# Patient Record
Sex: Female | Born: 1970 | Race: Black or African American | Hispanic: No | Marital: Married | Smoking: Current every day smoker
Health system: Southern US, Community
[De-identification: ages and names within clinical notes are randomized; demographics above are authoritative.]

## PROBLEM LIST (undated history)

## (undated) DIAGNOSIS — N390 Urinary tract infection, site not specified: Secondary | ICD-10-CM

## (undated) DIAGNOSIS — B019 Varicella without complication: Secondary | ICD-10-CM

## (undated) DIAGNOSIS — N83209 Unspecified ovarian cyst, unspecified side: Secondary | ICD-10-CM

## (undated) DIAGNOSIS — K219 Gastro-esophageal reflux disease without esophagitis: Secondary | ICD-10-CM

## (undated) DIAGNOSIS — F1910 Other psychoactive substance abuse, uncomplicated: Secondary | ICD-10-CM

## (undated) DIAGNOSIS — E786 Lipoprotein deficiency: Secondary | ICD-10-CM

## (undated) DIAGNOSIS — R030 Elevated blood-pressure reading, without diagnosis of hypertension: Secondary | ICD-10-CM

## (undated) DIAGNOSIS — D219 Benign neoplasm of connective and other soft tissue, unspecified: Secondary | ICD-10-CM

## (undated) DIAGNOSIS — I10 Essential (primary) hypertension: Secondary | ICD-10-CM

## (undated) DIAGNOSIS — A64 Unspecified sexually transmitted disease: Secondary | ICD-10-CM

## (undated) DIAGNOSIS — IMO0001 Reserved for inherently not codable concepts without codable children: Secondary | ICD-10-CM

## (undated) DIAGNOSIS — IMO0002 Reserved for concepts with insufficient information to code with codable children: Secondary | ICD-10-CM

## (undated) DIAGNOSIS — F1911 Other psychoactive substance abuse, in remission: Secondary | ICD-10-CM

## (undated) HISTORY — DX: Other psychoactive substance abuse, uncomplicated: F19.10

## (undated) HISTORY — PX: FOOT SURGERY: SHX648

## (undated) HISTORY — DX: Unspecified ovarian cyst, unspecified side: N83.209

## (undated) HISTORY — DX: Lipoprotein deficiency: E78.6

## (undated) HISTORY — DX: Other psychoactive substance abuse, in remission: F19.11

## (undated) HISTORY — DX: Urinary tract infection, site not specified: N39.0

## (undated) HISTORY — DX: Gastro-esophageal reflux disease without esophagitis: K21.9

## (undated) HISTORY — DX: Varicella without complication: B01.9

## (undated) HISTORY — PX: COLONOSCOPY: SHX174

## (undated) HISTORY — PX: WISDOM TOOTH EXTRACTION: SHX21

## (undated) HISTORY — DX: Reserved for concepts with insufficient information to code with codable children: IMO0002

## (undated) HISTORY — DX: Reserved for inherently not codable concepts without codable children: IMO0001

## (undated) HISTORY — DX: Benign neoplasm of connective and other soft tissue, unspecified: D21.9

## (undated) HISTORY — DX: Elevated blood-pressure reading, without diagnosis of hypertension: R03.0

## (undated) HISTORY — DX: Essential (primary) hypertension: I10

## (undated) HISTORY — DX: Unspecified sexually transmitted disease: A64

---

## 1998-04-28 ENCOUNTER — Emergency Department (HOSPITAL_COMMUNITY): Admission: EM | Admit: 1998-04-28 | Discharge: 1998-04-29 | Payer: Self-pay

## 2000-05-06 ENCOUNTER — Emergency Department (HOSPITAL_COMMUNITY): Admission: EM | Admit: 2000-05-06 | Discharge: 2000-05-06 | Payer: Self-pay | Admitting: Emergency Medicine

## 2000-05-06 ENCOUNTER — Encounter: Payer: Self-pay | Admitting: Family Medicine

## 2001-01-21 ENCOUNTER — Emergency Department (HOSPITAL_COMMUNITY): Admission: EM | Admit: 2001-01-21 | Discharge: 2001-01-21 | Payer: Self-pay | Admitting: Emergency Medicine

## 2001-01-22 ENCOUNTER — Emergency Department (HOSPITAL_COMMUNITY): Admission: EM | Admit: 2001-01-22 | Discharge: 2001-01-22 | Payer: Self-pay

## 2001-02-02 ENCOUNTER — Emergency Department (HOSPITAL_COMMUNITY): Admission: EM | Admit: 2001-02-02 | Discharge: 2001-02-02 | Payer: Self-pay | Admitting: Emergency Medicine

## 2001-02-09 ENCOUNTER — Emergency Department (HOSPITAL_COMMUNITY): Admission: EM | Admit: 2001-02-09 | Discharge: 2001-02-09 | Payer: Self-pay | Admitting: Emergency Medicine

## 2002-03-01 ENCOUNTER — Emergency Department (HOSPITAL_COMMUNITY): Admission: EM | Admit: 2002-03-01 | Discharge: 2002-03-01 | Payer: Self-pay | Admitting: Emergency Medicine

## 2006-01-30 ENCOUNTER — Emergency Department (HOSPITAL_COMMUNITY): Admission: EM | Admit: 2006-01-30 | Discharge: 2006-01-31 | Payer: Self-pay | Admitting: *Deleted

## 2006-02-09 ENCOUNTER — Emergency Department (HOSPITAL_COMMUNITY): Admission: EM | Admit: 2006-02-09 | Discharge: 2006-02-10 | Payer: Self-pay | Admitting: Emergency Medicine

## 2006-02-16 ENCOUNTER — Emergency Department (HOSPITAL_COMMUNITY): Admission: EM | Admit: 2006-02-16 | Discharge: 2006-02-16 | Payer: Self-pay | Admitting: Emergency Medicine

## 2007-11-01 ENCOUNTER — Encounter: Admission: RE | Admit: 2007-11-01 | Discharge: 2007-11-01 | Payer: Self-pay | Admitting: Family Medicine

## 2010-11-06 ENCOUNTER — Emergency Department (HOSPITAL_COMMUNITY)
Admission: EM | Admit: 2010-11-06 | Discharge: 2010-11-06 | Disposition: A | Discharge status: Home / Self Care | Payer: BC Managed Care – PPO | Attending: Emergency Medicine | Admitting: Emergency Medicine

## 2010-11-06 DIAGNOSIS — K089 Disorder of teeth and supporting structures, unspecified: Secondary | ICD-10-CM | POA: Insufficient documentation

## 2010-11-06 DIAGNOSIS — R220 Localized swelling, mass and lump, head: Secondary | ICD-10-CM | POA: Insufficient documentation

## 2010-11-06 DIAGNOSIS — R51 Headache: Secondary | ICD-10-CM | POA: Insufficient documentation

## 2012-06-14 ENCOUNTER — Encounter: Payer: BC Managed Care – PPO | Admitting: Obstetrics and Gynecology

## 2012-08-12 ENCOUNTER — Encounter: Payer: BC Managed Care – PPO | Admitting: Obstetrics and Gynecology

## 2013-06-03 ENCOUNTER — Other Ambulatory Visit: Payer: Self-pay

## 2013-06-04 ENCOUNTER — Ambulatory Visit (INDEPENDENT_AMBULATORY_CARE_PROVIDER_SITE_OTHER): Payer: PRIVATE HEALTH INSURANCE | Admitting: Physician Assistant

## 2013-06-04 ENCOUNTER — Encounter: Payer: Self-pay | Admitting: Physician Assistant

## 2013-06-04 VITALS — BP 124/99 | HR 84 | Temp 98.4°F | Resp 16 | Ht 63.0 in | Wt 231.0 lb

## 2013-06-04 DIAGNOSIS — R030 Elevated blood-pressure reading, without diagnosis of hypertension: Secondary | ICD-10-CM

## 2013-06-04 DIAGNOSIS — Z87891 Personal history of nicotine dependence: Secondary | ICD-10-CM | POA: Insufficient documentation

## 2013-06-04 DIAGNOSIS — Z0000 Encounter for general adult medical examination without abnormal findings: Secondary | ICD-10-CM | POA: Insufficient documentation

## 2013-06-04 DIAGNOSIS — R09A2 Foreign body sensation, throat: Secondary | ICD-10-CM | POA: Insufficient documentation

## 2013-06-04 DIAGNOSIS — Z7189 Other specified counseling: Secondary | ICD-10-CM

## 2013-06-04 DIAGNOSIS — K219 Gastro-esophageal reflux disease without esophagitis: Secondary | ICD-10-CM

## 2013-06-04 DIAGNOSIS — Z23 Encounter for immunization: Secondary | ICD-10-CM

## 2013-06-04 DIAGNOSIS — Z720 Tobacco use: Secondary | ICD-10-CM

## 2013-06-04 DIAGNOSIS — F17200 Nicotine dependence, unspecified, uncomplicated: Secondary | ICD-10-CM

## 2013-06-04 DIAGNOSIS — Z716 Tobacco abuse counseling: Secondary | ICD-10-CM

## 2013-06-04 DIAGNOSIS — K2100 Gastro-esophageal reflux disease with esophagitis, without bleeding: Secondary | ICD-10-CM | POA: Insufficient documentation

## 2013-06-04 LAB — CBC WITH DIFFERENTIAL/PLATELET
Basophils Absolute: 0 K/uL (ref 0.0–0.1)
Basophils Relative: 0 % (ref 0–1)
Eosinophils Absolute: 0.1 K/uL (ref 0.0–0.7)
Eosinophils Relative: 1 % (ref 0–5)
HCT: 41.8 % (ref 36.0–46.0)
Hemoglobin: 14.3 g/dL (ref 12.0–15.0)
Lymphocytes Relative: 31 % (ref 12–46)
Lymphs Abs: 3 K/uL (ref 0.7–4.0)
MCH: 28.5 pg (ref 26.0–34.0)
MCHC: 34.2 g/dL (ref 30.0–36.0)
MCV: 83.3 fL (ref 78.0–100.0)
Monocytes Absolute: 0.6 K/uL (ref 0.1–1.0)
Monocytes Relative: 6 % (ref 3–12)
Neutro Abs: 6.1 K/uL (ref 1.7–7.7)
Neutrophils Relative %: 62 % (ref 43–77)
Platelets: 226 K/uL (ref 150–400)
RBC: 5.02 MIL/uL (ref 3.87–5.11)
RDW: 14.1 % (ref 11.5–15.5)
WBC: 9.9 K/uL (ref 4.0–10.5)

## 2013-06-04 MED ORDER — VARENICLINE TARTRATE 0.5 MG X 11 & 1 MG X 42 PO MISC: ORAL | Status: DC

## 2013-06-04 MED ORDER — OMEPRAZOLE 20 MG PO CPDR: 20.0000 mg | DELAYED_RELEASE_CAPSULE | Freq: Every day | ORAL | Status: DC

## 2013-06-04 NOTE — Assessment & Plan Note (Signed)
10 minutes spent in smoking cessation education.  Handout given.  Rx Chantix.  F/U 1 month.

## 2013-06-04 NOTE — Assessment & Plan Note (Signed)
>>  ASSESSMENT AND PLAN FOR GERD WITH ESOPHAGITIS WRITTEN ON 06/04/2013 12:22 PM BY Marcelline Mates C, PA-C  Diet/Exercise.  Counseled patient on trigger foods and avoiding laying down right after meals. Rx daily prilosec.  F/U 1 month

## 2013-06-04 NOTE — Assessment & Plan Note (Signed)
Patient to obtain fasting labs.  Will set up Screening MM. Influenza vaccination given today.

## 2013-06-04 NOTE — Progress Notes (Signed)
Patient ID: Nicole Mueller, female   DOB: Feb 28, 1971, 42 y.o.   MRN: [id number]  Patient presents to clinic today to establish care  Acid Reflux:   Patient c/o persistent indigestion and reflux, especially at night time.  States this has been going on for several months.  She endorses gaining weight.  Denies difficulty swallowing, chest pain, shortness of breath.  Denies halitosis or globus.  Denies chronic cough.  Has taken prilosec before but has never taken medication daily.  Elevated BP:   Patient endorses history of elevated blood pressures in the past but states she has never been diagnosed with hypertension.  Endorses significant family history of HTN.  Denies diabetes or high cholesterol.  Denies chest pain, lightheadedness, dizziness, heart palpitations, syncope.  Endorses weight gain and poor diet.  Smoking Cessation:   Patient endorses she would like to stop smoking.  Endorses several unsuccessful attempts in the past including, trying to quit cold [place]; patches, telephone hotlines.  Has had a prescription for Chantix in the past but never got it filled.  Denies history of anxiety, depression, SI/HI, or other mental illness.  Abdominal Cramping:   Patient endorses morning abdominal cramps over the past month and a half.  States the cramping is intense each morning upon awakening and is usually relieved with defecation.  States her stools seem to alternate between diarrhea and constipation.  Denies hematochezia or melena.  Cramping is not associated with food choice or menstrual cycle.  LMP 1 week ago.  Patient endorses significant increase in stressors recently.  Health Maintenance: Dental -- Patient overdue for cleaning and checkup. Vision --  Last eye exam in 2011. Has appointment scheduled in 1 month for checkup. Colonoscopy -- Patient denies family history of CRC. Mammography -- Patient due for mammogram.  Will set up screening MM. Immunizations -- Patient due for influenza  vaccination.  Will give today.  Past Medical History  Diagnosis Date  . Chicken pox   . GERD (gastroesophageal reflux disease)   . Elevated blood pressure   . Drug abuse in remission   . UTI (lower urinary tract infection)   . Hypolipidemia    No current outpatient prescriptions on file prior to visit.   No current facility-administered medications on file prior to visit.    No Known Allergies  Family History  Problem Relation Age of Onset  . Alcoholism Father     Deceased  . Drug abuse Father   . Prostate cancer Father   . Hypertension Mother     Living  . Diabetes Mother   . Alcoholism Maternal Grandfather   . Arthritis Maternal Grandmother   . Breast cancer Maternal Grandmother   . Stroke Maternal Grandmother   . Stroke Maternal Aunt   . Hypertension Maternal Grandmother   . Alcoholism Maternal Uncle     x2  . Alcoholism Maternal Aunt   . Colon cancer Maternal Aunt   . Hyperlipidemia Mother   . Hyperlipidemia Maternal Aunt   . Heart murmur Mother   . Hypertension Maternal Aunt     x2  . Dementia Maternal Grandfather   . Stroke Brother   . Drug abuse Brother   . Heart disease Brother   . Aneurysm Maternal Aunt   . Bone cancer Paternal Aunt     History   Social History  . Marital Status: Single    Spouse Name: N/A    Number of Children: N/A  . Years of Education: N/A   Social History  Main Topics  . Smoking status: Current Every Day Smoker -- 1.00 packs/day    Types: Cigarettes  . Smokeless tobacco: Never Used  . Alcohol Use: No  . Drug Use: No  . Sexual Activity: Yes    Birth Control/ Protection: None, Diaphragm     Comment: 1 female partner   Other Topics Concern  . None   Social History Narrative  . None   Review of Systems  Constitutional: Negative for fever, chills and weight loss.  HENT: Negative for hearing loss, ear pain, tinnitus and ear discharge.   Eyes: Negative for blurred vision, double vision, photophobia and pain.   Respiratory: Negative for cough, shortness of breath and wheezing.   Cardiovascular: Negative for chest pain and palpitations.  Gastrointestinal: Positive for heartburn and abdominal pain. Negative for nausea, vomiting, diarrhea, constipation, blood in stool and melena.  Genitourinary: Negative for dysuria, urgency, frequency, hematuria and flank pain.  Musculoskeletal: Negative for myalgias and back pain.  Neurological: Negative for dizziness, seizures, loss of consciousness and headaches.  Endo/Heme/Allergies: Negative for environmental allergies. Does not bruise/bleed easily.  Psychiatric/Behavioral: Negative for depression, suicidal ideas, hallucinations and substance abuse. The patient is not nervous/anxious and does not have insomnia.    Filed Vitals:   06/04/13 1045  BP: 124/99  Pulse:   Temp:   Resp:    Physical Exam  Vitals reviewed. Constitutional: She is oriented to person, place, and time.  Well-developed, obese, in no acute distress  HENT:  Head: Normocephalic and atraumatic.  Right Ear: External ear normal.  Left Ear: External ear normal.  Nose: Nose normal.  Mouth/Throat: Oropharynx is clear and moist. No oropharyngeal exudate.  TM WNL bilaterally  Eyes: Conjunctivae and EOM are normal. Pupils are equal, round, and reactive to light.  Neck: Normal range of motion. Neck supple.  Cardiovascular: Normal rate, regular rhythm, normal heart sounds and intact distal pulses.   Pulmonary/Chest: Effort normal and breath sounds normal. No respiratory distress. She has no wheezes. She has no rales. She exhibits no tenderness.  Abdominal: Soft. Bowel sounds are normal. She exhibits no distension and no mass. There is no tenderness. There is no rebound and no guarding.  Musculoskeletal: Normal range of motion. She exhibits no tenderness.  Lymphadenopathy:    She has no cervical adenopathy.  Neurological: She is alert and oriented to person, place, and time. She has normal  reflexes. No cranial nerve deficit.  Skin: Skin is warm and dry. No rash noted.  Psychiatric: Affect normal.   Assessment/Plan: Acid reflux Diet/Exercise.  Counseled patient on trigger foods and avoiding laying down right after meals. Rx daily prilosec.  F/U 1 month  Smoking trying to quit 10 minutes spent in smoking cessation education.  Handout given.  Rx Chantix.  F/U 1 month.  Elevated BP DASH diet given.  Recheck BP in 1 month.  Will start therapy at that time if BP still remains elevated.  Encounter for preventive health examination Patient to obtain fasting labs.  Will set up Screening MM. Influenza vaccination given today.

## 2013-06-04 NOTE — Patient Instructions (Signed)
Please obtain labs.  I will call you with the results.  I have made a referral for you to have a screening mammogram.  The imaging department will call you to set up a date/time.  I want you to read the information below about the DASH diet.  I would like for you to start this diet and exercise 3 times a week.  I want to see you in 1 month to recheck your blood pressure.  We may need to start medications at that time if BP not improved.  Please read information below about smoking cessation.  Please take Prilosec and Chantix as prescribed.    DASH Diet The DASH diet stands for "Dietary Approaches to Stop Hypertension." It is a healthy eating plan that has been shown to reduce high blood pressure (hypertension) in as little as 14 days, while also possibly providing other significant health benefits. These other health benefits include reducing the risk of breast cancer after menopause and reducing the risk of type 2 diabetes, heart disease, colon cancer, and stroke. Health benefits also include weight loss and slowing kidney failure in patients with chronic kidney disease.  DIET GUIDELINES  Limit salt (sodium). Your diet should contain less than 1500 mg of sodium daily.  Limit refined or processed carbohydrates. Your diet should include mostly whole grains. Desserts and added sugars should be used sparingly.  Include small amounts of heart-healthy fats. These types of fats include nuts, oils, and tub margarine. Limit saturated and trans fats. These fats have been shown to be harmful in the body. CHOOSING FOODS  The following food groups are based on a 2000 calorie diet. See your Registered Dietitian for individual calorie needs. Grains and Grain Products (6 to 8 servings daily)  Eat More Often: Whole-wheat bread, brown rice, whole-grain or wheat pasta, quinoa, popcorn without added fat or salt (air popped).  Eat Less Often: White bread, white pasta, white rice, cornbread. Vegetables (4 to 5 servings  daily)  Eat More Often: Fresh, frozen, and canned vegetables. Vegetables may be raw, steamed, roasted, or grilled with a minimal amount of fat.  Eat Less Often/Avoid: Creamed or fried vegetables. Vegetables in a cheese sauce. Fruit (4 to 5 servings daily)  Eat More Often: All fresh, canned (in natural juice), or frozen fruits. Dried fruits without added sugar. One hundred percent fruit juice ( cup [237 mL] daily).  Eat Less Often: Dried fruits with added sugar. Canned fruit in light or heavy syrup. [employer], Fish, and Poultry (2 servings or less daily. One serving is 3 to 4 oz [85-114 g]).  Eat More Often: Ninety percent or leaner ground beef, tenderloin, sirloin. Round cuts of beef, chicken breast, [place] breast. All fish. Grill, bake, or broil your meat. Nothing should be fried.  Eat Less Often/Avoid: Fatty cuts of meat, [place], or chicken leg, thigh, or wing. Fried cuts of meat or fish. Dairy (2 to 3 servings)  Eat More Often: Low-fat or fat-free milk, low-fat plain or light yogurt, reduced-fat or part-skim cheese.  Eat Less Often/Avoid: Milk (whole, 2%).Whole milk yogurt. Full-fat cheeses. Nuts, Seeds, and Legumes (4 to 5 servings per week)  Eat More Often: All without added salt.  Eat Less Often/Avoid: Salted nuts and seeds, canned beans with added salt. Fats and Sweets (limited)  Eat More Often: Vegetable oils, tub margarines without trans fats, sugar-free gelatin. Mayonnaise and salad dressings.  Eat Less Often/Avoid: Coconut oils, palm oils, butter, stick margarine, cream, half and half, cookies, candy, pie.  FOR MORE INFORMATION The Dash Diet Eating Plan: www.dashdiet.org Document Released: 08/24/2011 Document Revised: 11/27/2011 Document Reviewed: 08/24/2011 [facility] Patient Information 2014 [place], [place].  Smoking Cessation Quitting smoking is important to your health and has many advantages. However, it is not always easy to quit since nicotine is a very addictive  drug. Often times, people try 3 times or more before being able to quit. This document explains the best ways for you to prepare to quit smoking. Quitting takes hard work and a lot of effort, but you can do it. ADVANTAGES OF QUITTING SMOKING  You will live longer, feel better, and live better.  Your body will feel the impact of quitting smoking almost immediately.  Within 20 minutes, blood pressure decreases. Your pulse returns to its normal level.  After 8 hours, carbon monoxide levels in the blood return to normal. Your oxygen level increases.  After 24 hours, the chance of having a heart attack starts to decrease. Your breath, hair, and body stop smelling like smoke.  After 48 hours, damaged nerve endings begin to recover. Your sense of taste and smell improve.  After 72 hours, the body is virtually free of nicotine. Your bronchial tubes relax and breathing becomes easier.  After 2 to 12 weeks, lungs can hold more air. Exercise becomes easier and circulation improves.  The risk of having a heart attack, stroke, cancer, or lung disease is greatly reduced.  After 1 year, the risk of coronary heart disease is cut in half.  After 5 years, the risk of stroke falls to the same as a nonsmoker.  After 10 years, the risk of lung cancer is cut in half and the risk of other cancers decreases significantly.  After 15 years, the risk of coronary heart disease drops, usually to the level of a nonsmoker.  If you are pregnant, quitting smoking will improve your chances of having a healthy baby.  The people you live with, especially any children, will be healthier.  You will have extra money to spend on things other than cigarettes. QUESTIONS TO THINK ABOUT BEFORE ATTEMPTING TO QUIT You may want to talk about your answers with your caregiver.  Why do you want to quit?  If you tried to quit in the past, what helped and what did not?  What will be the most difficult situations for you after  you quit? How will you plan to handle them?  Who can help you through the tough times? Your family? Friends? A caregiver?  What pleasures do you get from smoking? What ways can you still get pleasure if you quit? Here are some questions to ask your caregiver:  How can you help me to be successful at quitting?  What medicine do you think would be best for me and how should I take it?  What should I do if I need more help?  What is smoking withdrawal like? How can I get information on withdrawal? GET READY  Set a quit date.  Change your environment by getting rid of all cigarettes, ashtrays, matches, and lighters in your home, car, or work. Do not let people smoke in your home.  Review your past attempts to quit. Think about what worked and what did not. GET SUPPORT AND ENCOURAGEMENT You have a better chance of being successful if you have help. You can get support in many ways.  Tell your family, friends, and co-workers that you are going to quit and need their support. Ask them not to smoke  around you.  Get individual, group, or telephone counseling and support. Programs are available at [employer] and health centers. Call your local health department for information about programs in your area.  Spiritual beliefs and practices may help some smokers quit.  Download a "quit meter" on your computer to keep track of quit statistics, such as how long you have gone without smoking, cigarettes not smoked, and money saved.  Get a self-help book about quitting smoking and staying off of tobacco. LEARN NEW SKILLS AND BEHAVIORS  Distract yourself from urges to smoke. Talk to someone, go for a walk, or occupy your time with a task.  Change your normal routine. Take a different route to work. Drink tea instead of coffee. Eat breakfast in a different place.  Reduce your stress. Take a hot bath, exercise, or read a book.  Plan something enjoyable to do every day. Reward yourself for  not smoking.  Explore interactive web-based programs that specialize in helping you quit. GET MEDICINE AND USE IT CORRECTLY Medicines can help you stop smoking and decrease the urge to smoke. Combining medicine with the above behavioral methods and support can greatly increase your chances of successfully quitting smoking.  Nicotine replacement therapy helps deliver nicotine to your body without the negative effects and risks of smoking. Nicotine replacement therapy includes nicotine gum, lozenges, inhalers, nasal sprays, and skin patches. Some may be available over-the-counter and others require a prescription.  Antidepressant medicine helps people abstain from smoking, but how this works is unknown. This medicine is available by prescription.  Nicotinic receptor partial agonist medicine simulates the effect of nicotine in your brain. This medicine is available by prescription. Ask your caregiver for advice about which medicines to use and how to use them based on your health history. Your caregiver will tell you what side effects to look out for if you choose to be on a medicine or therapy. Carefully read the information on the package. Do not use any other product containing nicotine while using a nicotine replacement product.  RELAPSE OR DIFFICULT SITUATIONS Most relapses occur within the first 3 months after quitting. Do not be discouraged if you start smoking again. Remember, most people try several times before finally quitting. You may have symptoms of withdrawal because your body is used to nicotine. You may crave cigarettes, be irritable, feel very hungry, cough often, get headaches, or have difficulty concentrating. The withdrawal symptoms are only temporary. They are strongest when you first quit, but they will go away within 10 14 days. To reduce the chances of relapse, try to:  Avoid drinking alcohol. Drinking lowers your chances of successfully quitting.  Reduce the amount of  caffeine you consume. Once you quit smoking, the amount of caffeine in your body increases and can give you symptoms, such as a rapid heartbeat, sweating, and anxiety.  Avoid smokers because they can make you want to smoke.  Do not let weight gain distract you. Many smokers will gain weight when they quit, usually less than 10 pounds. Eat a healthy diet and stay active. You can always lose the weight gained after you quit.  Find ways to improve your mood other than smoking. FOR MORE INFORMATION  www.smokefree.gov  Document Released: 08/29/2001 Document Revised: 03/05/2012 Document Reviewed: 12/14/2011 [facility] Patient Information 2014 [place], [place].  Varenicline oral tablets (Chantix) What is this medicine? VARENICLINE (var EN i kleen) is used to help people quit smoking. It can reduce the symptoms caused by stopping smoking. It is  used with a patient support program recommended by your physician. This medicine may be used for other purposes; ask your health care provider or pharmacist if you have questions. What should I tell my health care provider before I take this medicine? They need to know if you have any of these conditions: -bipolar disorder, depression, schizophrenia or other mental illness -heart disease -kidney disease -peripheral vascular disease -stroke -suicidal thoughts, plans, or attempt; a previous suicide attempt by you or a family member -an unusual or allergic reaction to varenicline, other medicines, foods, dyes, or preservatives -pregnant or trying to get pregnant -breast-feeding How should I use this medicine? You should set a date to stop smoking and tell your doctor. Start this medicine one week before the quit date. You can also start taking this medicine before you choose a quit date, and then pick a quit date that is between 8 and 35 days of treatment with this medicine. Stick to your plan; ask about support groups or other ways to help you remain a  'quitter'. Take this medicine by mouth after eating. Take with a full glass of water. Follow the directions on the prescription label. Take your doses at regular intervals. Do not take your medicine more often than directed. A special MedGuide will be given to you by the pharmacist with each prescription and refill. Be sure to read this information carefully each time. Talk to your pediatrician regarding the use of this medicine in children. This medicine is not approved for use in children. Overdosage: If you think you have taken too much of this medicine contact a poison control center or emergency room at once. NOTE: This medicine is only for you. Do not share this medicine with others. What if I miss a dose? If you miss a dose, take it as soon as you can. If it is almost time for your next dose, take only that dose. Do not take double or extra doses. What may interact with this medicine? -insulin -other stop smoking aids -theophylline -warfarin This list may not describe all possible interactions. Give your health care provider a list of all the medicines, herbs, non-prescription drugs, or dietary supplements you use. Also tell them if you smoke, drink alcohol, or use illegal drugs. Some items may interact with your medicine. What should I watch for while using this medicine? Visit your doctor or health care professional for regular check ups. Ask for ongoing advice and encouragement from your doctor or healthcare professional, friends, and family to help you quit. If you smoke while on this medication, quit again Your mouth may get dry. Chewing sugarless gum or sucking hard candy, and drinking plenty of water may help. Contact your doctor if the problem does not go away or is severe. You may get drowsy or dizzy. Do not drive, use machinery, or do anything that needs mental alertness until you know how this medicine affects you. Do not stand or sit up quickly, especially if you are an older  patient. This reduces the risk of dizzy or fainting spells. The use of this medicine may increase the chance of suicidal thoughts or actions. Pay special attention to how you are responding while on this medicine. Any worsening of mood, or thoughts of suicide or dying should be reported to your health care professional right away. What side effects may I notice from receiving this medicine? Side effects that you should report to your doctor or health care professional as soon as possible: -allergic reactions  like skin rash, itching or hives, swelling of the face, lips, tongue, or throat -breathing problems -changes in vision -chest pain or chest tightness -confusion, trouble speaking or understanding -fast, irregular heartbeat -feeling faint or lightheaded, falls -fever -pain in legs when walking -problems with balance, talking, walking -ringing in ears -sudden numbness or weakness of the face, arm or leg -suicidal thoughts or other mood changes -trouble passing urine or change in the amount of urine -unusual bleeding or bruising -unusually weak or tired Side effects that usually do not require medical attention (report to your doctor or health care professional if they continue or are bothersome): -constipation -headache -nausea, vomiting -strange dreams -stomach gas -trouble sleeping This list may not describe all possible side effects. Call your doctor for medical advice about side effects. You may report side effects to FDA at 1-800-FDA-1088. Where should I keep my medicine? Keep out of the reach of children. Store at room temperature between 15 and 30 degrees C (59 and 86 degrees F). Throw away any unused medicine after the expiration date. NOTE: This sheet is a summary. It may not cover all possible information. If you have questions about this medicine, talk to your doctor, pharmacist, or health care provider.  2013, [employer]. (04/11/2010 3:12:38 PM)

## 2013-06-04 NOTE — Assessment & Plan Note (Signed)
DASH diet given.  Recheck BP in 1 month.  Will start therapy at that time if BP still remains elevated.

## 2013-06-04 NOTE — Assessment & Plan Note (Signed)
Diet/Exercise.  Counseled patient on trigger foods and avoiding laying down right after meals. Rx daily prilosec.  F/U 1 month

## 2013-06-05 ENCOUNTER — Other Ambulatory Visit: Payer: Self-pay | Admitting: Physician Assistant

## 2013-06-05 ENCOUNTER — Ambulatory Visit (INDEPENDENT_AMBULATORY_CARE_PROVIDER_SITE_OTHER): Payer: PRIVATE HEALTH INSURANCE | Admitting: Certified Nurse Midwife

## 2013-06-05 ENCOUNTER — Telehealth: Payer: Self-pay | Admitting: Physician Assistant

## 2013-06-05 ENCOUNTER — Encounter: Payer: Self-pay | Admitting: Certified Nurse Midwife

## 2013-06-05 VITALS — BP 102/72 | HR 64 | Resp 16 | Ht 62.25 in | Wt 229.0 lb

## 2013-06-05 DIAGNOSIS — Z0000 Encounter for general adult medical examination without abnormal findings: Secondary | ICD-10-CM

## 2013-06-05 DIAGNOSIS — N760 Acute vaginitis: Secondary | ICD-10-CM

## 2013-06-05 DIAGNOSIS — R8290 Unspecified abnormal findings in urine: Secondary | ICD-10-CM

## 2013-06-05 DIAGNOSIS — Z01419 Encounter for gynecological examination (general) (routine) without abnormal findings: Secondary | ICD-10-CM

## 2013-06-05 LAB — LIPID PANEL
Cholesterol: 171 mg/dL (ref 0–200)
Triglycerides: 77 mg/dL (ref <150)
VLDL: 15 mg/dL (ref 0–40)

## 2013-06-05 LAB — BASIC METABOLIC PANEL
CO2: 26 mEq/L (ref 19–32)
Calcium: 9.3 mg/dL (ref 8.4–10.5)
Creat: 0.66 mg/dL (ref 0.50–1.10)

## 2013-06-05 LAB — URINALYSIS, MICROSCOPIC ONLY: Casts: NONE SEEN

## 2013-06-05 LAB — URINALYSIS, ROUTINE W REFLEX MICROSCOPIC
Glucose, UA: NEGATIVE mg/dL
Leukocytes, UA: NEGATIVE
pH: 7 (ref 5.0–8.0)

## 2013-06-05 LAB — HEPATIC FUNCTION PANEL
Indirect Bilirubin: 0.3 mg/dL (ref 0.0–0.9)
Total Bilirubin: 0.4 mg/dL (ref 0.3–1.2)
Total Protein: 6.7 g/dL (ref 6.0–8.3)

## 2013-06-05 MED ORDER — METRONIDAZOLE 0.75 % VA GEL: 1.0000 | Freq: Every day | VAGINAL | Status: DC

## 2013-06-05 NOTE — Progress Notes (Signed)
42 y.o. [id number] Single African American Fe here to establish care and for annual exam.Periods normal no issues. Contraception none desired, pregnancy OK. Feels she can now not get pregnant. Now has scheduled with PCP for care, was seen yesterday for initial visit and labs. Now on dietary control for Hypertension. Desires STD screening.   Patient's last menstrual period was 05/13/2013.          Sexually active: yes  The current method of family planning is none.    Exercising: no  exercise Smoker:  yes  Health Maintenance: Pap:  2012 MMG:  2009  Colonoscopy:  none BMD:   none TDaP:  Unsure per patient Labs: none Self breast exam:not done   reports that she has been smoking Cigarettes.  She has been smoking about 1.00 pack per day. She has never used smokeless tobacco. She reports that she does not drink alcohol or use illicit drugs.  Past Medical History  Diagnosis Date  . Chicken pox   . GERD (gastroesophageal reflux disease)   . Elevated blood pressure   . Drug abuse in remission   . UTI (lower urinary tract infection)   . Hypolipidemia   . STD (sexually transmitted disease)     gc over 61yrs ago    Past Surgical History  Procedure Laterality Date  . Wisdom tooth extraction    . Foot surgery      Right    Current Outpatient Prescriptions  Medication Sig Dispense Refill  . omeprazole (PRILOSEC) 20 MG capsule Take 1 capsule (20 mg total) by mouth daily.  30 capsule  3  . varenicline (CHANTIX STARTING MONTH PAK) 0.5 MG X 11 & 1 MG X 42 tablet Take one 0.5 mg tablet by mouth once daily for 3 days, then increase to one 0.5 mg tablet twice daily for 4 days, then increase to one 1 mg tablet twice daily.  53 tablet  0   No current facility-administered medications for this visit.    Family History  Problem Relation Age of Onset  . Alcoholism Father     Deceased  . Drug abuse Father   . Prostate cancer Father   . Hypertension Mother     Living  . Diabetes Mother   .  Hyperlipidemia Mother   . Heart murmur Mother   . Alcoholism Maternal Grandfather   . Dementia Maternal Grandfather   . Arthritis Maternal Grandmother   . Breast cancer Maternal Grandmother   . Stroke Maternal Grandmother   . Hypertension Maternal Grandmother   . Stroke Maternal Aunt   . Colon cancer Maternal Aunt   . Hyperlipidemia Maternal Aunt   . Alcoholism Maternal Uncle     x2  . Alcoholism Maternal Aunt   . Aneurysm Maternal Aunt   . Hypertension Maternal Aunt     x2  . Stroke Brother   . Drug abuse Brother   . Heart disease Brother   . Bone cancer Paternal Aunt     ROS:  Pertinent items are noted in HPI.  Otherwise, a comprehensive ROS was negative.  Exam:   BP 102/72  Pulse 64  Resp 16  Ht 5' 2.25" (1.581 m)  Wt 229 lb (103.874 kg)  BMI 41.56 kg/m2  LMP 05/13/2013 Height: 5' 2.25" (158.1 cm)  Ht Readings from Last 3 Encounters:  06/05/13 5' 2.25" (1.581 m)  06/04/13 5\' 3"  (1.6 m)    General appearance: alert, cooperative and appears stated age Head: Normocephalic, without obvious abnormality, atraumatic Neck:  no adenopathy, supple, symmetrical, trachea midline and thyroid normal to inspection and palpation Lungs: clear to auscultation bilaterally Breasts: normal appearance, no masses or tenderness, No nipple retraction or dimpling, No nipple discharge or bleeding, No axillary or supraclavicular adenopathy Heart: regular rate and rhythm Abdomen: soft, non-tender; no masses,  no organomegaly Extremities: extremities normal, atraumatic, no cyanosis or edema Skin: Skin color, texture, turgor normal. No rashes or lesions Lymph nodes: Cervical, supraclavicular, and axillary nodes normal. No abnormal inguinal nodes palpated Neurologic: Grossly normal   Pelvic: External genitalia:  no lesions              Urethra:  normal appearing urethra with no masses, tenderness or lesions              Bartholin's and Skene's: normal                 Vagina: normal appearing  vagina with normal color and discharge, no lesions              Cervix: normal, non tender              Pap taken: yes Bimanual Exam:  Uterus:  normal size, contour, position, consistency, mobility, non-tender and anteverted              Adnexa: normal adnexa and no mass, fullness, tenderness               Rectovaginal: Confirms               Anus:  normal sphincter tone, no lesions  A:  Well Woman with normal exam  Contraception none desired  Smoker has Rx for Chantix from PCP  STD screening  P:   Reviewed health and wellness pertinent to exam  Discussed pregnancy may not be occuring due to partner, due patient history of previous pregnancies.  Pap smear as per guidelines   mammogram pap smear counseled on breast self exam, mammography screening, STD prevention, HIV risk factors and prevention, adequate intake of calcium and vitamin D, diet and exercise return annually or prn  An After Visit Summary was printed and given to the patient.

## 2013-06-05 NOTE — Patient Instructions (Addendum)

## 2013-06-05 NOTE — Telephone Encounter (Signed)
Discussed results with patient.  Patient to try diet/exercise regimen, monitoring her intake of saturated fats, cholesterol and refined sugars.  Has follow-up with me in 1 month for Chantix and smoking cessation.

## 2013-06-06 LAB — HIV ANTIBODY (ROUTINE TESTING W REFLEX): HIV: NONREACTIVE

## 2013-06-06 LAB — RPR

## 2013-06-10 NOTE — Progress Notes (Signed)
Note reviewed, agree with plan.  Yamato Kopf, MD  

## 2013-06-18 ENCOUNTER — Telehealth: Payer: Self-pay | Admitting: Physician Assistant

## 2013-06-18 NOTE — Telephone Encounter (Signed)
Received medical records from [facility]

## 2013-06-30 ENCOUNTER — Ambulatory Visit: Payer: PRIVATE HEALTH INSURANCE | Admitting: Physician Assistant

## 2013-07-02 ENCOUNTER — Ambulatory Visit (HOSPITAL_BASED_OUTPATIENT_CLINIC_OR_DEPARTMENT_OTHER)
Admission: RE | Admit: 2013-07-02 | Discharge: 2013-07-02 | Disposition: A | Discharge status: Home / Self Care | Payer: PRIVATE HEALTH INSURANCE | Source: Ambulatory Visit | Attending: Physician Assistant | Admitting: Physician Assistant

## 2013-07-02 DIAGNOSIS — Z0000 Encounter for general adult medical examination without abnormal findings: Secondary | ICD-10-CM

## 2013-07-02 DIAGNOSIS — Z1231 Encounter for screening mammogram for malignant neoplasm of breast: Secondary | ICD-10-CM | POA: Insufficient documentation

## 2013-07-03 ENCOUNTER — Ambulatory Visit (INDEPENDENT_AMBULATORY_CARE_PROVIDER_SITE_OTHER): Payer: PRIVATE HEALTH INSURANCE | Admitting: Physician Assistant

## 2013-07-03 ENCOUNTER — Encounter: Payer: Self-pay | Admitting: Physician Assistant

## 2013-07-03 VITALS — BP 128/86 | HR 81 | Temp 97.6°F | Resp 16 | Ht 62.25 in | Wt 232.0 lb

## 2013-07-03 DIAGNOSIS — K219 Gastro-esophageal reflux disease without esophagitis: Secondary | ICD-10-CM

## 2013-07-03 DIAGNOSIS — F17200 Nicotine dependence, unspecified, uncomplicated: Secondary | ICD-10-CM

## 2013-07-03 DIAGNOSIS — R030 Elevated blood-pressure reading, without diagnosis of hypertension: Secondary | ICD-10-CM

## 2013-07-03 DIAGNOSIS — Z720 Tobacco use: Secondary | ICD-10-CM

## 2013-07-03 MED ORDER — VARENICLINE TARTRATE 1 MG PO TABS: 1.0000 mg | ORAL_TABLET | Freq: Two times a day (BID) | ORAL | Status: DC

## 2013-07-03 NOTE — Patient Instructions (Signed)
Please take prilosec every day for 3-4 weeks.  Monitor your diet.  Continue with Chantix.  I would like to see you in 1 month.  Exercise to Lose Weight Exercise and a healthy diet may help you lose weight. Your doctor may suggest specific exercises. EXERCISE IDEAS AND TIPS  Choose low-cost things you enjoy doing, such as walking, bicycling, or exercising to workout videos.  Take stairs instead of the elevator.  Walk during your lunch break.  Park your car further away from work or school.  Go to a gym or an exercise class.  Start with 5 to 10 minutes of exercise each day. Build up to 30 minutes of exercise 4 to 6 days a week.  Wear shoes with good support and comfortable clothes.  Stretch before and after working out.  Work out until you breathe harder and your heart beats faster.  Drink extra water when you exercise.  Do not do so much that you hurt yourself, feel dizzy, or get very short of breath. Exercises that burn about 150 calories:  Running 1  miles in 15 minutes.  Playing volleyball for 45 to 60 minutes.  Washing and waxing a car for 45 to 60 minutes.  Playing touch football for 45 minutes.  Walking 1  miles in 35 minutes.  Pushing a stroller 1  miles in 30 minutes.  Playing basketball for 30 minutes.  Raking leaves for 30 minutes.  Bicycling 5 miles in 30 minutes.  Walking 2 miles in 30 minutes.  Dancing for 30 minutes.  Shoveling snow for 15 minutes.  Swimming laps for 20 minutes.  Walking up stairs for 15 minutes.  Bicycling 4 miles in 15 minutes.  Gardening for 30 to 45 minutes.  Jumping rope for 15 minutes.  Washing windows or floors for 45 to 60 minutes. Document Released: 10/07/2010 Document Revised: 11/27/2011 Document Reviewed: 10/07/2010 [facility] Patient Information 2014 [place], [place].

## 2013-07-03 NOTE — Assessment & Plan Note (Signed)
BP good today

## 2013-07-03 NOTE — Assessment & Plan Note (Signed)
Instructed patient on importance of taking Prilosec every day for at least 3 weeks to assess for improvement of symptoms.  Again, encourage diet and weight loss.

## 2013-07-03 NOTE — Progress Notes (Signed)
Patient ID: Nicole Mueller, female   DOB: June 01, 1971, 42 y.o.   MRN: [id number]  Patient presents to clinic today for 1 month follow-up.  Acid Reflux -- patient endorses continued symptoms.  Patient taking prilosec only occasionally.  Has not changed diet.  Elevated BP -- BP good in clinic today.  Denies headache, lightheadedness, dizziness, chest pain, palpitations or syncope  Smoking Cessation -- Is on week 3 of chantix starter pack.  Has reduced from 10 cigarettes per day to 3 cigarettes per day.  Instructed patient on setting a quit date.  Feels hopeful that she will get down to 0 per day before her starting her maintenance doses.   Past Medical History  Diagnosis Date  . Chicken pox   . GERD (gastroesophageal reflux disease)   . Elevated blood pressure   . Drug abuse in remission   . UTI (lower urinary tract infection)   . Hypolipidemia   . STD (sexually transmitted disease)     gc over 78yrs ago    Current Outpatient Prescriptions on File Prior to Visit  Medication Sig Dispense Refill  . metroNIDAZOLE (METROGEL) 0.75 % vaginal gel Place 1 Applicatorful vaginally at bedtime.  70 g  0  . omeprazole (PRILOSEC) 20 MG capsule Take 1 capsule (20 mg total) by mouth daily.  30 capsule  3   No current facility-administered medications on file prior to visit.    No Known Allergies  Family History  Problem Relation Age of Onset  . Alcoholism Father     Deceased  . Drug abuse Father   . Prostate cancer Father   . Hypertension Mother     Living  . Diabetes Mother   . Hyperlipidemia Mother   . Heart murmur Mother   . Alcoholism Maternal Grandfather   . Dementia Maternal Grandfather   . Arthritis Maternal Grandmother   . Breast cancer Maternal Grandmother   . Stroke Maternal Grandmother   . Hypertension Maternal Grandmother   . Stroke Maternal Aunt   . Colon cancer Maternal Aunt   . Hyperlipidemia Maternal Aunt   . Alcoholism Maternal Uncle     x2  . Alcoholism  Maternal Aunt   . Aneurysm Maternal Aunt   . Hypertension Maternal Aunt     x2  . Stroke Brother   . Drug abuse Brother   . Heart disease Brother   . Bone cancer Paternal Aunt     History   Social History  . Marital Status: Single    Spouse Name: N/A    Number of Children: N/A  . Years of Education: N/A   Social History Main Topics  . Smoking status: Current Every Day Smoker -- 1.00 packs/day    Types: Cigarettes  . Smokeless tobacco: Never Used     Comment: Have Started Chantix.  Marland Kitchen Alcohol Use: No  . Drug Use: No  . Sexual Activity: Yes    Partners: Male    Birth Control/ Protection: None   Other Topics Concern  . None   Social History Narrative  . None   Review of Systems  Constitutional: Negative for fever, chills, weight loss and malaise/fatigue.  Eyes: Negative for blurred vision and double vision.  Respiratory: Negative for shortness of breath.   Cardiovascular: Negative for chest pain and palpitations.  Gastrointestinal: Positive for heartburn. Negative for nausea, vomiting and abdominal pain.  Neurological: Negative for loss of consciousness and headaches.  Psychiatric/Behavioral: Negative for depression. The patient is not nervous/anxious.  Filed Vitals:   07/03/13 0922  BP: 128/86  Pulse: 81  Temp: 97.6 F (36.4 C)  Resp: 16   Physical Exam  Vitals reviewed. Constitutional: She is oriented to person, place, and time and well-developed, well-nourished, and in no distress.  HENT:  Head: Normocephalic and atraumatic.  Eyes: Conjunctivae are normal.  Neck: Neck supple.  Cardiovascular: Normal rate, regular rhythm, normal heart sounds and intact distal pulses.   Pulmonary/Chest: Effort normal and breath sounds normal. No respiratory distress. She has no wheezes. She has no rales. She exhibits no tenderness.  Neurological: She is alert and oriented to person, place, and time.  Skin: Skin is warm and dry. No rash noted.  Psychiatric: Affect normal.      Recent Results (from the past 2160 hour(s))  CBC WITH DIFFERENTIAL     Status: None   Collection Time    06/04/13 11:10 AM      Result Value Range   WBC 9.9  4.0 - 10.5 K/uL   RBC 5.02  3.87 - 5.11 MIL/uL   Hemoglobin 14.3  12.0 - 15.0 g/dL   HCT 16.1  09.6 - 04.5 %   MCV 83.3  78.0 - 100.0 fL   MCH 28.5  26.0 - 34.0 pg   MCHC 34.2  30.0 - 36.0 g/dL   RDW 40.9  81.1 - 91.4 %   Platelets 226  150 - 400 K/uL   Neutrophils Relative % 62  43 - 77 %   Neutro Abs 6.1  1.7 - 7.7 K/uL   Lymphocytes Relative 31  12 - 46 %   Lymphs Abs 3.0  0.7 - 4.0 K/uL   Monocytes Relative 6  3 - 12 %   Monocytes Absolute 0.6  0.1 - 1.0 K/uL   Eosinophils Relative 1  0 - 5 %   Eosinophils Absolute 0.1  0.0 - 0.7 K/uL   Basophils Relative 0  0 - 1 %   Basophils Absolute 0.0  0.0 - 0.1 K/uL   Smear Review Criteria for review not met    BASIC METABOLIC PANEL     Status: Abnormal   Collection Time    06/04/13 11:10 AM      Result Value Range   Sodium 137  135 - 145 mEq/L   Potassium 4.4  3.5 - 5.3 mEq/L   Chloride 103  96 - 112 mEq/L   CO2 26  19 - 32 mEq/L   Glucose, Bld 69 (*) 70 - 99 mg/dL   BUN 9  6 - 23 mg/dL   Creat 7.82  9.56 - 2.13 mg/dL   Calcium 9.3  8.4 - 08.6 mg/dL  HEPATIC FUNCTION PANEL     Status: None   Collection Time    06/04/13 11:10 AM      Result Value Range   Total Bilirubin 0.4  0.3 - 1.2 mg/dL   Bilirubin, Direct 0.1  0.0 - 0.3 mg/dL   Indirect Bilirubin 0.3  0.0 - 0.9 mg/dL   Alkaline Phosphatase 99  39 - 117 U/L   AST 25  0 - 37 U/L   ALT 22  0 - 35 U/L   Total Protein 6.7  6.0 - 8.3 g/dL   Albumin 3.9  3.5 - 5.2 g/dL  TSH     Status: None   Collection Time    06/04/13 11:10 AM      Result Value Range   TSH 2.893  0.350 - 4.500 uIU/mL  LIPID PANEL  Status: Abnormal   Collection Time    06/04/13 11:10 AM      Result Value Range   Cholesterol 171  0 - 200 mg/dL   Comment: ATP III Classification:           < 200        mg/dL        Desirable           200 - 239     mg/dL        Borderline High          >= 240        mg/dL        High         Triglycerides 77  <150 mg/dL   HDL 45  >45 mg/dL   Total CHOL/HDL Ratio 3.8     VLDL 15  0 - 40 mg/dL   LDL Cholesterol 409 (*) 0 - 99 mg/dL   Comment:       Total Cholesterol/HDL Ratio:CHD Risk                            Coronary Heart Disease Risk Table                                            Men       Women              1/2 Average Risk              3.4        3.3                  Average Risk              5.0        4.4               2X Average Risk              9.6        7.1               3X Average Risk             23.4       11.0     Use the calculated Patient Ratio above and the CHD Risk table      to determine the patient's CHD Risk.     ATP III Classification (LDL):           < 100        mg/dL         Optimal          100 - 129     mg/dL         Near or Above Optimal          130 - 159     mg/dL         Borderline High          160 - 189     mg/dL         High           > 190        mg/dL         Very High        HEMOGLOBIN A1C  Status: Abnormal   Collection Time    06/04/13 11:10 AM      Result Value Range   Hemoglobin A1C 5.7 (*) <5.7 %   Comment:                                                                            According to the ADA Clinical Practice Recommendations for 2011, when     HbA1c is used as a screening test:             >=6.5%   Diagnostic of Diabetes Mellitus                (if abnormal result is confirmed)           5.7-6.4%   Increased risk of developing Diabetes Mellitus           References:Diagnosis and Classification of Diabetes Mellitus,Diabetes     Care,2011,34(Suppl 1):S62-S69 and Standards of Medical Care in             Diabetes - 2011,Diabetes Care,2011,34 (Suppl 1):S11-S61.         Mean Plasma Glucose 117 (*) <117 mg/dL  URINALYSIS, ROUTINE W REFLEX MICROSCOPIC     Status: Abnormal   Collection Time    06/04/13 11:10 AM       Result Value Range   Color, Urine YELLOW  YELLOW   APPearance CLEAR  CLEAR   Specific Gravity, Urine 1.026  1.005 - 1.030   pH 7.0  5.0 - 8.0   Glucose, UA NEG  NEG mg/dL   Bilirubin Urine NEG  NEG   Ketones, ur NEG  NEG mg/dL   Hgb urine dipstick NEG  NEG   Protein, ur NEG  NEG mg/dL   Urobilinogen, UA 1  0.0 - 1.0 mg/dL   Nitrite POS (*) NEG   Leukocytes, UA NEG  NEG  URINALYSIS, MICROSCOPIC ONLY     Status: Abnormal   Collection Time    06/04/13 11:10 AM      Result Value Range   Squamous Epithelial / LPF FEW  RARE   Crystals NONE SEEN  NONE SEEN   Casts NONE SEEN  NONE SEEN   WBC, UA 3-6 (*) <3 WBC/hpf   Comment: Confirmed by microscopic   RBC / HPF 0-2  <3 RBC/hpf   Bacteria, UA RARE  RARE  IPS N GONORRHOEA AND CHLAMYDIA BY PCR     Status: None   Collection Time    06/05/13 12:00 AM      Result Value Range   COMMENTS:       Comment: [facility]     [address] [place], [place] [address]     [address] [place], [place] [address]     Tel: [phone] Fax: [phone]     MOLECULAR PATHOLOGY REPORT     PATIENT NAME:VANSTORY, Cherylann NPATHOLOGY#:[id number]: F DOB: September 06, 1971 (Age: 85) MEDICAL RECORD BMWUXL:[id number] DOCTOR:Debbie Darcel Bayley, CNM DATE OBTAINED:9/18/2014CLIENT:[facility] DATE RECEIVED:9/19/2014OTHER PHYS: DATE SIGNED:06/07/2013     Patient Results:          1 ThinPrep for Chlamydia trachomatis          Chlamydia trachomatis  Negative                          2 ThinPrep for Neisseria gonorrhoeae          Neisseria gonorrhoeae     Negative                     Explanation     The Gen-Probe APTIMA Combo 2 Assay is a target amplification nucleic acid probe test that utilizes target capture for the in vitro qualitative detection and differentiation of ribosomal RNA (rRNA) from Chlamydia trachomatis (CT) and Neisseria gonorrhoeae      (NG) to aid in the diagnosis of chlamydial and gonococcal urogenital. A negative  result may not preclude infection because results are dependent on adequate specimen collection and sufficient nuclear material present for detection.      As with any diagnostic test, these results should be interpreted with consideration of all clinical and laboratory findings. Additional testing is recommended in any situation where false positive or false negative results could lead to adverse medical,      social or psychological consequences. SurePath(tm), Remel(tm) M4 Swab test and urine (2013 and forward) was developed and their performance characteristics determined by [employer]. It has not been cleared or approved by the U.S. Food      and Drug Administration. The FDA has determined that such clearance or approval is not necessary. This test is used for clinical purposes. It should not be regarded as investigational or for research. This laboratory is regulated under the Clinical      Laboratory Improvement Amendments of 1988 (CLIA) as qualified to perform high complexity clinical laboratory testing.     CPT [phone], 87591**Electronically Signed Out By**kmag/06/07/2013 Wilma Flavin, M.T.     Patient Address:     [address]     [place], [place]  [address]  IPS PAP TEST WITH HPV     Status: None   Collection Time    06/05/13 12:00 AM      Result Value Range   COMMENTS: [facility]     Comment: [address] [place], [place], [place] [address]     [address] [place], [place] [address]     GYN CYTOLOGY REPORT      PATIENT NAME:VANSTORY, Diasia N PATHOLOGY#:[id number]: F DOB: [birth year] (Age: 66) MEDICAL RECORD XBMWUX:[id number] DOCTOR:Debbie Darcel Bayley, CNM DATE OBTAINED:9/18/2014CLIENT:[facility] DATE RECEIVED:9/19/2014OTHER PHYS: DATE SIGNED:06/09/2013     Final Cytologic Interpretation:           Cervical, Thin Layer with Automated Imaging and Dual Review, CPT 88175          Negative for Intraepithelial Lesions or Malignancy.                ADEQUACY OF SPECIMEN:               Satisfactory for evaluation. Endocervical cells/transformation zone component identified.                      OTHER CYTOLOGIC FINDINGS:                Shift in flora suggestive of bacterial vaginosis.                     **Electronically Signed Out Bypa/9/22/2014Paige Law, CT (ASCP)[address], [place], [place] Pap test is a screening mechanism with excellent but not  perfect ability to prevent cervical carcinoma.  It has a low, but significant, diagnostic      error rate. The pap test is suboptimal  for detection of glandular lesions.  It should be noted that a negative result does not definitively rule out the presence of disease.Ref: DeMay, RM, The Art and Science of Cytopathology, [employer], [phone].     Procedure/Addenda:     HPV HR Only     High Risk HPV -    Not Detected      A result of "Detected" signifies the presence of one or more high risk types of HPV.  The APTIMA HPV Assay is an in vitro nucleic acid amplification test for the qualitative detection of E6/E7 viral messenger RNA (mRNA) from 14 high-risk types of human      papillomavirus (HPV) in cervical specimens. The high-risk HPV types detected by the assay include: 16, 18, 31, 33, 35, 39, 45, 51, 52, 56, 58, 59, 66, and 68. APTIMA HPV method will be performed on the [employer].      The APTIMA HPV Assay is designed to enhance existing methods for the detection of cervical disease and should be used in conjunction with clinical information derived from other diagnostic and screening tests, physical examinations, and full medical      history in accordance with appropriate patient management procedures.     The APTIMA HPV Assay on ThinPrep(tm) PreservCyt(tm) specimens is FDA approved on the [employer].The APTIMA HPV Assay on SurePath(tm) specimens was developed and its performance characteristics determined by [facility].  It      has  not been cleared or approved by the U.S. Food and Drug Administration.  The FDA has determined that such clearance or approval is not necessary.  This test is used for clinical purposes.  This laboratory is certified under the Clinical Laboratory      improvement Amendments of 1988 (CLIA) as qualified to perform high complexity clinical laboratory testing.     **Electronically Signed Out By Wilma Flavin, M.T.**     Last Menstrual Period: 05/13/13      PATIENT ADDRESS:      [address]      [place], [place]  [address]     PHONE: [phone]^P^PH   TSH     Status: None   Collection Time    06/05/13 10:55 AM      Result Value Range   TSH 2.117  0.350 - 4.500 uIU/mL  RPR     Status: None   Collection Time    06/05/13 10:55 AM      Result Value Range   RPR NON REAC  NON REAC  HIV ANTIBODY (ROUTINE TESTING)     Status: None   Collection Time    06/05/13 10:55 AM      Result Value Range   HIV NON REACTIVE  NON REACTIVE    Assessment/Plan: Acid reflux Instructed patient on importance of taking Prilosec every day for at least 3 weeks to assess for improvement of symptoms.  Again, encourage diet and weight loss.    Elevated BP BP good today.  Smoking trying to quit Continue Chantix.  Encourage patient to select a Quit Date.  Follow-up in 1 month.

## 2013-07-03 NOTE — Assessment & Plan Note (Signed)
Continue Chantix.  Encourage patient to select a Quit Date.  Follow-up in 1 month.

## 2013-07-03 NOTE — Assessment & Plan Note (Signed)
>>  ASSESSMENT AND PLAN FOR GERD WITH ESOPHAGITIS WRITTEN ON 07/03/2013 12:18 PM BY Waldon Merl, PA-C  Instructed patient on importance of taking Prilosec every day for at least 3 weeks to assess for improvement of symptoms.  Again, encourage diet and weight loss.

## 2013-08-06 ENCOUNTER — Ambulatory Visit: Payer: PRIVATE HEALTH INSURANCE | Admitting: Physician Assistant

## 2013-08-13 ENCOUNTER — Encounter: Payer: Self-pay | Admitting: Physician Assistant

## 2013-08-13 ENCOUNTER — Ambulatory Visit (INDEPENDENT_AMBULATORY_CARE_PROVIDER_SITE_OTHER): Payer: PRIVATE HEALTH INSURANCE | Admitting: Physician Assistant

## 2013-08-13 VITALS — BP 136/92 | HR 77 | Temp 97.9°F | Resp 16 | Ht 62.25 in | Wt 233.8 lb

## 2013-08-13 DIAGNOSIS — Z720 Tobacco use: Secondary | ICD-10-CM

## 2013-08-13 DIAGNOSIS — F17200 Nicotine dependence, unspecified, uncomplicated: Secondary | ICD-10-CM

## 2013-08-13 MED ORDER — NICOTINE 21 MG/24HR TD PT24: 21.0000 mg | MEDICATED_PATCH | Freq: Every day | TRANSDERMAL | Status: DC

## 2013-08-13 NOTE — Assessment & Plan Note (Signed)
D/C Chantix.  Will attempt trial of Nicoderm CQ, beginning with 21 mg patch a day for 4-6 weeks.  Stepping down as tolerated.  Patient given handout on smoking cessation and tips to aid in her efforts.  Follow-up in 1 month.

## 2013-08-13 NOTE — Progress Notes (Signed)
Patient ID: Nicole Mueller, female   DOB: 24-Jun-1971, 42 y.o.   MRN: [id number]  Patient presents to clinic today for follow-up of smoking cessation.  Patient has been on Chantix for ~ 2 months.  Patient has not taken medication in a few weeks.  States she feels like it was not working for her.  Patient states that she would like to try something else for smoking cessation or should she restart Chantix.   Past Medical History  Diagnosis Date  . Chicken pox   . GERD (gastroesophageal reflux disease)   . Elevated blood pressure   . Drug abuse in remission   . UTI (lower urinary tract infection)   . Hypolipidemia   . STD (sexually transmitted disease)     gc over 66yrs ago    Current Outpatient Prescriptions on File Prior to Visit  Medication Sig Dispense Refill  . omeprazole (PRILOSEC) 20 MG capsule Take 1 capsule (20 mg total) by mouth daily.  30 capsule  3  . varenicline (CHANTIX CONTINUING MONTH PAK) 1 MG tablet Take 1 tablet (1 mg total) by mouth 2 (two) times daily.  30 tablet  1   No current facility-administered medications on file prior to visit.    No Known Allergies  Family History  Problem Relation Age of Onset  . Alcoholism Father     Deceased  . Drug abuse Father   . Prostate cancer Father   . Hypertension Mother     Living  . Diabetes Mother   . Hyperlipidemia Mother   . Heart murmur Mother   . Alcoholism Maternal Grandfather   . Dementia Maternal Grandfather   . Arthritis Maternal Grandmother   . Breast cancer Maternal Grandmother   . Stroke Maternal Grandmother   . Hypertension Maternal Grandmother   . Stroke Maternal Aunt   . Colon cancer Maternal Aunt   . Hyperlipidemia Maternal Aunt   . Alcoholism Maternal Uncle     x2  . Alcoholism Maternal Aunt   . Aneurysm Maternal Aunt   . Hypertension Maternal Aunt     x2  . Stroke Brother   . Drug abuse Brother   . Heart disease Brother   . Bone cancer Paternal Aunt     History   Social History  .  Marital Status: Single    Spouse Name: N/A    Number of Children: N/A  . Years of Education: N/A   Social History Main Topics  . Smoking status: Current Every Day Smoker -- 1.00 packs/day    Types: Cigarettes  . Smokeless tobacco: Never Used     Comment: Have Started Chantix.  Marland Kitchen Alcohol Use: No  . Drug Use: No  . Sexual Activity: Yes    Partners: Male    Birth Control/ Protection: None   Other Topics Concern  . None   Social History Narrative  . None   Review of Systems  Constitutional: Negative for fever, chills and malaise/fatigue.  Respiratory: Negative for cough, shortness of breath and wheezing.   Neurological: Negative for headaches.   Filed Vitals:   08/13/13 1027  BP: 136/92  Pulse: 77  Temp: 97.9 F (36.6 C)  Resp: 16   Physical Exam  Vitals reviewed. Constitutional: She is oriented to person, place, and time and well-developed, well-nourished, and in no distress.  HENT:  Head: Normocephalic and atraumatic.  Eyes: Conjunctivae are normal.  Neck: Neck supple.  Pulmonary/Chest: Effort normal.  Neurological: She is alert and oriented to person,  place, and time.  Skin: Skin is warm and dry. No rash noted.  Psychiatric: Affect normal.     Recent Results (from the past 2160 hour(s))  CBC WITH DIFFERENTIAL     Status: None   Collection Time    06/04/13 11:10 AM      Result Value Range   WBC 9.9  4.0 - 10.5 K/uL   RBC 5.02  3.87 - 5.11 MIL/uL   Hemoglobin 14.3  12.0 - 15.0 g/dL   HCT 16.1  09.6 - 04.5 %   MCV 83.3  78.0 - 100.0 fL   MCH 28.5  26.0 - 34.0 pg   MCHC 34.2  30.0 - 36.0 g/dL   RDW 40.9  81.1 - 91.4 %   Platelets 226  150 - 400 K/uL   Neutrophils Relative % 62  43 - 77 %   Neutro Abs 6.1  1.7 - 7.7 K/uL   Lymphocytes Relative 31  12 - 46 %   Lymphs Abs 3.0  0.7 - 4.0 K/uL   Monocytes Relative 6  3 - 12 %   Monocytes Absolute 0.6  0.1 - 1.0 K/uL   Eosinophils Relative 1  0 - 5 %   Eosinophils Absolute 0.1  0.0 - 0.7 K/uL   Basophils  Relative 0  0 - 1 %   Basophils Absolute 0.0  0.0 - 0.1 K/uL   Smear Review Criteria for review not met    BASIC METABOLIC PANEL     Status: Abnormal   Collection Time    06/04/13 11:10 AM      Result Value Range   Sodium 137  135 - 145 mEq/L   Potassium 4.4  3.5 - 5.3 mEq/L   Chloride 103  96 - 112 mEq/L   CO2 26  19 - 32 mEq/L   Glucose, Bld 69 (*) 70 - 99 mg/dL   BUN 9  6 - 23 mg/dL   Creat 7.82  9.56 - 2.13 mg/dL   Calcium 9.3  8.4 - 08.6 mg/dL  HEPATIC FUNCTION PANEL     Status: None   Collection Time    06/04/13 11:10 AM      Result Value Range   Total Bilirubin 0.4  0.3 - 1.2 mg/dL   Bilirubin, Direct 0.1  0.0 - 0.3 mg/dL   Indirect Bilirubin 0.3  0.0 - 0.9 mg/dL   Alkaline Phosphatase 99  39 - 117 U/L   AST 25  0 - 37 U/L   ALT 22  0 - 35 U/L   Total Protein 6.7  6.0 - 8.3 g/dL   Albumin 3.9  3.5 - 5.2 g/dL  TSH     Status: None   Collection Time    06/04/13 11:10 AM      Result Value Range   TSH 2.893  0.350 - 4.500 uIU/mL  LIPID PANEL     Status: Abnormal   Collection Time    06/04/13 11:10 AM      Result Value Range   Cholesterol 171  0 - 200 mg/dL   Comment: ATP III Classification:           < 200        mg/dL        Desirable          200 - 239     mg/dL        Borderline High          >= 240  mg/dL        High         Triglycerides 77  <150 mg/dL   HDL 45  >08 mg/dL   Total CHOL/HDL Ratio 3.8     VLDL 15  0 - 40 mg/dL   LDL Cholesterol 657 (*) 0 - 99 mg/dL   Comment:       Total Cholesterol/HDL Ratio:CHD Risk                            Coronary Heart Disease Risk Table                                            Men       Women              1/2 Average Risk              3.4        3.3                  Average Risk              5.0        4.4               2X Average Risk              9.6        7.1               3X Average Risk             23.4       11.0     Use the calculated Patient Ratio above and the CHD Risk table      to determine the  patient's CHD Risk.     ATP III Classification (LDL):           < 100        mg/dL         Optimal          100 - 129     mg/dL         Near or Above Optimal          130 - 159     mg/dL         Borderline High          160 - 189     mg/dL         High           > 190        mg/dL         Very High        HEMOGLOBIN A1C     Status: Abnormal   Collection Time    06/04/13 11:10 AM      Result Value Range   Hemoglobin A1C 5.7 (*) <5.7 %   Comment:  According to the ADA Clinical Practice Recommendations for 2011, when     HbA1c is used as a screening test:             >=6.5%   Diagnostic of Diabetes Mellitus                (if abnormal result is confirmed)           5.7-6.4%   Increased risk of developing Diabetes Mellitus           References:Diagnosis and Classification of Diabetes Mellitus,Diabetes     Care,2011,34(Suppl 1):S62-S69 and Standards of Medical Care in             Diabetes - 2011,Diabetes Care,2011,34 (Suppl 1):S11-S61.         Mean Plasma Glucose 117 (*) <117 mg/dL  URINALYSIS, ROUTINE W REFLEX MICROSCOPIC     Status: Abnormal   Collection Time    06/04/13 11:10 AM      Result Value Range   Color, Urine YELLOW  YELLOW   APPearance CLEAR  CLEAR   Specific Gravity, Urine 1.026  1.005 - 1.030   pH 7.0  5.0 - 8.0   Glucose, UA NEG  NEG mg/dL   Bilirubin Urine NEG  NEG   Ketones, ur NEG  NEG mg/dL   Hgb urine dipstick NEG  NEG   Protein, ur NEG  NEG mg/dL   Urobilinogen, UA 1  0.0 - 1.0 mg/dL   Nitrite POS (*) NEG   Leukocytes, UA NEG  NEG  URINALYSIS, MICROSCOPIC ONLY     Status: Abnormal   Collection Time    06/04/13 11:10 AM      Result Value Range   Squamous Epithelial / LPF FEW  RARE   Crystals NONE SEEN  NONE SEEN   Casts NONE SEEN  NONE SEEN   WBC, UA 3-6 (*) <3 WBC/hpf   Comment: Confirmed by microscopic   RBC / HPF 0-2  <3 RBC/hpf   Bacteria, UA RARE  RARE  IPS N GONORRHOEA  AND CHLAMYDIA BY PCR     Status: None   Collection Time    06/05/13 12:00 AM      Result Value Range   COMMENTS:       Comment: [facility]     [address] [place], [place] [address]     [address] [place], [place] [address]     Tel: [phone] Fax: [phone]     MOLECULAR PATHOLOGY REPORT     PATIENT NAME:VANSTORY, Rajni NPATHOLOGY#:[id number]: F DOB: 10-Dec-1970 (Age: 61) MEDICAL RECORD VHQION:[id number] DOCTOR:Debbie Darcel Bayley, CNM DATE OBTAINED:9/18/2014CLIENT:[facility] DATE RECEIVED:9/19/2014OTHER PHYS: DATE SIGNED:06/07/2013     Patient Results:          1 ThinPrep for Chlamydia trachomatis          Chlamydia trachomatis     Negative                          2 ThinPrep for Neisseria gonorrhoeae          Neisseria gonorrhoeae     Negative                     Explanation     The Gen-Probe APTIMA Combo 2 Assay is a target amplification nucleic acid probe test that utilizes target capture for the in vitro qualitative detection and differentiation of ribosomal RNA (rRNA) from Chlamydia trachomatis (  CT) and Neisseria gonorrhoeae      (NG) to aid in the diagnosis of chlamydial and gonococcal urogenital. A negative result may not preclude infection because results are dependent on adequate specimen collection and sufficient nuclear material present for detection.      As with any diagnostic test, these results should be interpreted with consideration of all clinical and laboratory findings. Additional testing is recommended in any situation where false positive or false negative results could lead to adverse medical,      social or psychological consequences. SurePath(tm), Remel(tm) M4 Swab test and urine (2013 and forward) was developed and their performance characteristics determined by [employer]. It has not been cleared or approved by the U.S. Food      and Drug Administration. The FDA has determined that such clearance  or approval is not necessary. This test is used for clinical purposes. It should not be regarded as investigational or for research. This laboratory is regulated under the Clinical      Laboratory Improvement Amendments of 1988 (CLIA) as qualified to perform high complexity clinical laboratory testing.     CPT [id number], 87591Electronically Signed Out Bykmag/06/07/2013 Wilma Flavin, M.T.     Patient Address:     [address]     [place], [place]  [address]  IPS PAP TEST WITH HPV     Status: None   Collection Time    06/05/13 12:00 AM      Result Value Range   COMMENTS: [facility]     Comment: [address] [place], [place], [place] [address]     [address] [place], [place] [address]     GYN CYTOLOGY REPORT      PATIENT NAME:VANSTORY, Orilla N PATHOLOGY#:[id number]: F DOB: May 25, 1971 (Age: 29) MEDICAL RECORD OZHYQM:[id number] DOCTOR:Debbie Darcel Bayley, CNM DATE OBTAINED:9/18/2014CLIENT:[facility] DATE RECEIVED:9/19/2014OTHER PHYS: DATE SIGNED:06/09/2013     Final Cytologic Interpretation:           Cervical, Thin Layer with Automated Imaging and Dual Review, CPT 88175          Negative for Intraepithelial Lesions or Malignancy.               ADEQUACY OF SPECIMEN:               Satisfactory for evaluation. Endocervical cells/transformation zone component identified.                      OTHER CYTOLOGIC FINDINGS:                Shift in flora suggestive of bacterial vaginosis.                     **Electronically Signed Out Bypa/9/22/2014Paige Law, CT (ASCP)[address], [place], [place] Pap test is a screening mechanism with excellent but not perfect ability to prevent cervical carcinoma.  It has a low, but significant, diagnostic      error rate. The pap test is suboptimal  for detection of glandular lesions.  It should be noted that a negative result does not definitively rule out the presence of disease.Ref: DeMay, RM, The Art and Science of  Cytopathology, [employer], [phone].     Procedure/Addenda:     HPV HR Only     High Risk HPV -    Not Detected      A result of "Detected" signifies the presence of one or more high risk types  of HPV.  The APTIMA HPV Assay is an in vitro nucleic acid amplification test for the qualitative detection of E6/E7 viral messenger RNA (mRNA) from 14 high-risk types of human      papillomavirus (HPV) in cervical specimens. The high-risk HPV types detected by the assay include: 16, 18, 31, 33, 35, 39, 45, 51, 52, 56, 58, 59, 66, and 68. APTIMA HPV method will be performed on the [employer].      The APTIMA HPV Assay is designed to enhance existing methods for the detection of cervical disease and should be used in conjunction with clinical information derived from other diagnostic and screening tests, physical examinations, and full medical      history in accordance with appropriate patient management procedures.     The APTIMA HPV Assay on ThinPrep(tm) PreservCyt(tm) specimens is FDA approved on the [employer].The APTIMA HPV Assay on SurePath(tm) specimens was developed and its performance characteristics determined by [facility].  It      has not been cleared or approved by the U.S. Food and Drug Administration.  The FDA has determined that such clearance or approval is not necessary.  This test is used for clinical purposes.  This laboratory is certified under the Clinical Laboratory      improvement Amendments of 1988 (CLIA) as qualified to perform high complexity clinical laboratory testing.     Electronically Signed Out By Wilma Flavin, M.T.      Last Menstrual Period: 05/13/13      PATIENT ADDRESS:      [address]      [place], [place]  [address]     PHONE: [phone]^P^PH   TSH     Status: None   Collection Time    06/05/13 10:55 AM      Result Value Range   TSH 2.117  0.350 - 4.500 uIU/mL  RPR     Status: None   Collection Time     06/05/13 10:55 AM      Result Value Range   RPR NON REAC  NON REAC  HIV ANTIBODY (ROUTINE TESTING)     Status: None   Collection Time    06/05/13 10:55 AM      Result Value Range   HIV NON REACTIVE  NON REACTIVE    Assessment/Plan: Smoking trying to quit D/C Chantix.  Will attempt trial of Nicoderm CQ, beginning with 21 mg patch a day for 4-6 weeks.  Stepping down as tolerated.  Patient given handout on smoking cessation and tips to aid in her efforts.  Follow-up in 1 month.

## 2013-08-13 NOTE — Patient Instructions (Signed)
Please apply 1 patch per day to skin of arm.  Alternate arms each day.  Start medication on the day wish to quit smoking.  Follow-up in 1 month to reassess things.  Smoking Cessation Quitting smoking is important to your health and has many advantages. However, it is not always easy to quit since nicotine is a very addictive drug. Often times, people try 3 times or more before being able to quit. This document explains the best ways for you to prepare to quit smoking. Quitting takes hard work and a lot of effort, but you can do it. ADVANTAGES OF QUITTING SMOKING  You will live longer, feel better, and live better.  Your body will feel the impact of quitting smoking almost immediately.  Within 20 minutes, blood pressure decreases. Your pulse returns to its normal level.  After 8 hours, carbon monoxide levels in the blood return to normal. Your oxygen level increases.  After 24 hours, the chance of having a heart attack starts to decrease. Your breath, hair, and body stop smelling like smoke.  After 48 hours, damaged nerve endings begin to recover. Your sense of taste and smell improve.  After 72 hours, the body is virtually free of nicotine. Your bronchial tubes relax and breathing becomes easier.  After 2 to 12 weeks, lungs can hold more air. Exercise becomes easier and circulation improves.  The risk of having a heart attack, stroke, cancer, or lung disease is greatly reduced.  After 1 year, the risk of coronary heart disease is cut in half.  After 5 years, the risk of stroke falls to the same as a nonsmoker.  After 10 years, the risk of lung cancer is cut in half and the risk of other cancers decreases significantly.  After 15 years, the risk of coronary heart disease drops, usually to the level of a nonsmoker.  If you are pregnant, quitting smoking will improve your chances of having a healthy baby.  The people you live with, especially any children, will be healthier.  You  will have extra money to spend on things other than cigarettes. QUESTIONS TO THINK ABOUT BEFORE ATTEMPTING TO QUIT You may want to talk about your answers with your caregiver.  Why do you want to quit?  If you tried to quit in the past, what helped and what did not?  What will be the most difficult situations for you after you quit? How will you plan to handle them?  Who can help you through the tough times? Your family? Friends? A caregiver?  What pleasures do you get from smoking? What ways can you still get pleasure if you quit? Here are some questions to ask your caregiver:  How can you help me to be successful at quitting?  What medicine do you think would be best for me and how should I take it?  What should I do if I need more help?  What is smoking withdrawal like? How can I get information on withdrawal? GET READY  Set a quit date.  Change your environment by getting rid of all cigarettes, ashtrays, matches, and lighters in your home, car, or work. Do not let people smoke in your home.  Review your past attempts to quit. Think about what worked and what did not. GET SUPPORT AND ENCOURAGEMENT You have a better chance of being successful if you have help. You can get support in many ways.  Tell your family, friends, and co-workers that you are going to quit and  need their support. Ask them not to smoke around you.  Get individual, group, or telephone counseling and support. Programs are available at [employer] and health centers. Call your local health department for information about programs in your area.  Spiritual beliefs and practices may help some smokers quit.  Download a "quit meter" on your computer to keep track of quit statistics, such as how long you have gone without smoking, cigarettes not smoked, and money saved.  Get a self-help book about quitting smoking and staying off of tobacco. LEARN NEW SKILLS AND BEHAVIORS  Distract yourself from urges to  smoke. Talk to someone, go for a walk, or occupy your time with a task.  Change your normal routine. Take a different route to work. Drink tea instead of coffee. Eat breakfast in a different place.  Reduce your stress. Take a hot bath, exercise, or read a book.  Plan something enjoyable to do every day. Reward yourself for not smoking.  Explore interactive web-based programs that specialize in helping you quit. GET MEDICINE AND USE IT CORRECTLY Medicines can help you stop smoking and decrease the urge to smoke. Combining medicine with the above behavioral methods and support can greatly increase your chances of successfully quitting smoking.  Nicotine replacement therapy helps deliver nicotine to your body without the negative effects and risks of smoking. Nicotine replacement therapy includes nicotine gum, lozenges, inhalers, nasal sprays, and skin patches. Some may be available over-the-counter and others require a prescription.  Antidepressant medicine helps people abstain from smoking, but how this works is unknown. This medicine is available by prescription.  Nicotinic receptor partial agonist medicine simulates the effect of nicotine in your brain. This medicine is available by prescription. Ask your caregiver for advice about which medicines to use and how to use them based on your health history. Your caregiver will tell you what side effects to look out for if you choose to be on a medicine or therapy. Carefully read the information on the package. Do not use any other product containing nicotine while using a nicotine replacement product.  RELAPSE OR DIFFICULT SITUATIONS Most relapses occur within the first 3 months after quitting. Do not be discouraged if you start smoking again. Remember, most people try several times before finally quitting. You may have symptoms of withdrawal because your body is used to nicotine. You may crave cigarettes, be irritable, feel very hungry, cough often,  get headaches, or have difficulty concentrating. The withdrawal symptoms are only temporary. They are strongest when you first quit, but they will go away within 10 14 days. To reduce the chances of relapse, try to:  Avoid drinking alcohol. Drinking lowers your chances of successfully quitting.  Reduce the amount of caffeine you consume. Once you quit smoking, the amount of caffeine in your body increases and can give you symptoms, such as a rapid heartbeat, sweating, and anxiety.  Avoid smokers because they can make you want to smoke.  Do not let weight gain distract you. Many smokers will gain weight when they quit, usually less than 10 pounds. Eat a healthy diet and stay active. You can always lose the weight gained after you quit.  Find ways to improve your mood other than smoking. FOR MORE INFORMATION  www.smokefree.gov  Document Released: 08/29/2001 Document Revised: 03/05/2012 Document Reviewed: 12/14/2011 [facility] Patient Information 2014 [place], [place].

## 2013-08-13 NOTE — Progress Notes (Signed)
Pre visit review using our clinic review tool, if applicable. No additional management support is needed unless otherwise documented below in the visit note/SLS  

## 2013-09-15 ENCOUNTER — Telehealth: Payer: Self-pay | Admitting: Physician Assistant

## 2013-09-15 ENCOUNTER — Ambulatory Visit (INDEPENDENT_AMBULATORY_CARE_PROVIDER_SITE_OTHER): Payer: PRIVATE HEALTH INSURANCE | Admitting: Physician Assistant

## 2013-09-15 ENCOUNTER — Encounter: Payer: Self-pay | Admitting: Physician Assistant

## 2013-09-15 VITALS — BP 124/90 | HR 96 | Temp 98.3°F | Resp 16 | Ht 62.25 in | Wt 236.0 lb

## 2013-09-15 DIAGNOSIS — R030 Elevated blood-pressure reading, without diagnosis of hypertension: Secondary | ICD-10-CM

## 2013-09-15 DIAGNOSIS — E669 Obesity, unspecified: Secondary | ICD-10-CM

## 2013-09-15 DIAGNOSIS — Z720 Tobacco use: Secondary | ICD-10-CM

## 2013-09-15 DIAGNOSIS — F17200 Nicotine dependence, unspecified, uncomplicated: Secondary | ICD-10-CM

## 2013-09-15 MED ORDER — ORLISTAT 60 MG PO CAPS: 60.0000 mg | ORAL_CAPSULE | Freq: Three times a day (TID) | ORAL | Status: DC

## 2013-09-15 NOTE — Telephone Encounter (Signed)
alli 60 mg is not available  The only thing available to replace that med is xenical 120 mg  Please advise pharmacy what to do

## 2013-09-15 NOTE — Telephone Encounter (Signed)
Patient can get Alli(Orlistat) over the counter.

## 2013-09-15 NOTE — Telephone Encounter (Signed)
Left detailed message on pharmacy voicemail and notified pt.

## 2013-09-15 NOTE — Patient Instructions (Signed)
Please take Alli as prescribed.  Monitor your intake of food high in cholesterol and saturated fats.  Eat more lean protein and vegetables.  Continue to exercise.  Please read information below on DASH diet and Smoking Cessation.  Please start the nicoderm patches.  We can send you to a nutritionist if you would like.  Follow-up in 1 month.  DASH Diet The DASH diet stands for "Dietary Approaches to Stop Hypertension." It is a healthy eating plan that has been shown to reduce high blood pressure (hypertension) in as little as 14 days, while also possibly providing other significant health benefits. These other health benefits include reducing the risk of breast cancer after menopause and reducing the risk of type 2 diabetes, heart disease, colon cancer, and stroke. Health benefits also include weight loss and slowing kidney failure in patients with chronic kidney disease.  DIET GUIDELINES  Limit salt (sodium). Your diet should contain less than 1500 mg of sodium daily.  Limit refined or processed carbohydrates. Your diet should include mostly whole grains. Desserts and added sugars should be used sparingly.  Include small amounts of heart-healthy fats. These types of fats include nuts, oils, and tub margarine. Limit saturated and trans fats. These fats have been shown to be harmful in the body. CHOOSING FOODS  The following food groups are based on a 2000 calorie diet. See your Registered Dietitian for individual calorie needs. Grains and Grain Products (6 to 8 servings daily)  Eat More Often: Whole-wheat bread, brown rice, whole-grain or wheat pasta, quinoa, popcorn without added fat or salt (air popped).  Eat Less Often: White bread, white pasta, white rice, cornbread. Vegetables (4 to 5 servings daily)  Eat More Often: Fresh, frozen, and canned vegetables. Vegetables may be raw, steamed, roasted, or grilled with a minimal amount of fat.  Eat Less Often/Avoid: Creamed or fried vegetables.  Vegetables in a cheese sauce. Fruit (4 to 5 servings daily)  Eat More Often: All fresh, canned (in natural juice), or frozen fruits. Dried fruits without added sugar. One hundred percent fruit juice ( cup [237 mL] daily).  Eat Less Often: Dried fruits with added sugar. Canned fruit in light or heavy syrup. [employer], Fish, and Poultry (2 servings or less daily. One serving is 3 to 4 oz [85-114 g]).  Eat More Often: Ninety percen  t or leaner ground beef, tenderloin, sirloin. Round cuts of beef, chicken breast, [place] breast. All fish. Grill, bake, or broil your meat. Nothing should be fried.  Eat Less Often/Avoid: Fatty cuts of meat, [place], or chicken leg, thigh, or wing. Fried cuts of meat or fish. Dairy (2 to 3 servings)  Eat More Often: Low-fat or fat-free milk, low-fat plain or light yogurt, reduced-fat or part-skim cheese.  Eat Less Often/Avoid: Milk (whole, 2%).Whole milk yogurt. Full-fat cheeses. Nuts, Seeds, and Legumes (4 to 5 servings per week)  Eat More Often: All without added salt.  Eat Less Often/Avoid: Salted nuts and seeds, canned beans with added salt. Fats and Sweets (limited)  Eat More Often: Vegetable oils, tub margarines without trans fats, sugar-free gelatin. Mayonnaise and salad dressings.  Eat Less Often/Avoid: Coconut oils, palm oils, butter, stick margarine, cream, half and half, cookies, candy, pie. FOR MORE INFORMATION The Dash Diet Eating Plan: www.dashdiet.org Document Released: 08/24/2011 Document Revised: 11/27/2011 Document Reviewed: 08/24/2011 [facility] Patient Information 2014 [place], [place].  Smoking Hazards Smoking cigarettes is extremely bad for your health. Tobacco smoke has over 200 known poisons in it. There are over  60 chemicals in tobacco smoke that cause cancer. Some of the chemicals found in cigarette smoke include:   Cyanide.  Benzene.  Formaldehyde.  Methanol (wood alcohol).  Acetylene (fuel used in welding  torches).  Ammonia. Cigarette smoke also contains the poisonous gases nitrogen oxide and carbon monoxide.  Cigarette smokers have an increased risk of many serious medical problems, including:  Lung cancer.  Lung disease (such as pneumonia, bronchitis, and emphysema).  Heart attack and chest pain due to the heart not getting enough oxygen (angina).  Heart disease and peripheral blood vessel disease.  Hypertension.  Stroke.  Oral cancer (cancer of the lip, mouth, or voice box).  Bladder cancer.  Pancreatic cancer.  Cervical cancer.  Pregnancy complications, including premature birth.  Low birthweight babies.  Early menopause.  Lower estrogen level for women.  Infertility.  Facial wrinkles.  Blindness.  Increased risk of broken bones (fractures).  Senile dementia.  Stillbirths and smaller newborn babies, birth defects, and genetic damage to sperm.  Stomach ulcers and internal bleeding. Children of smokers have an increased risk of the following, because of secondhand smoke exposure:   Sudden infant death syndrome (SIDS).  Respiratory infections.  Lung cancer.  Heart disease.  Ear infections. Smoking causes approximately:  90% of all lung cancer deaths in men.  80% of all lung cancer deaths in women.  90% of deaths from chronic obstructive lung disease. Compared with nonsmokers, smoking increases the risk of:  Coronary heart disease by 2 to 4 times.  Stroke by 2 to 4 times.  Men developing lung cancer by 23 times.  Women developing lung cancer by 13 times.  Dying from chronic obstructive lung diseases by 12 times. Someone who smokes 2 packs a day loses about 8 years of his or her life. Even smoking lightly shortens your life expectancy by several years. You can greatly reduce the risk of medical problems for you and your family by stopping now. Smoking is the most preventable cause of death and disease in our society. Within days of quitting  smoking, your circulation returns to normal, you decrease the risk of having a heart attack, and your lung capacity improves. There may be some increased phlegm in the first few days after quitting, and it may take months for your lungs to clear up completely. Quitting for 10 years cuts your lung cancer risk to almost that of a nonsmoker. WHY IS SMOKING ADDICTIVE?  Nicotine is the chemical agent in tobacco that is capable of causing addiction or dependence.  When you smoke and inhale, nicotine is absorbed rapidly into the bloodstream through your lungs. Nicotine absorbed through the lungs is capable of creating a powerful addiction. Both inhaled and non-inhaled nicotine may be addictive.  Addiction studies of cigarettes and spit tobacco show that addiction to nicotine occurs mainly during the teen years, when young people begin using tobacco products. WHAT ARE THE BENEFITS OF QUITTING?  There are many health benefits to quitting smoking.   Likelihood of developing cancer and heart disease decreases. Health improvements are seen almost immediately.  Blood pressure, pulse rate, and breathing patterns start returning to normal soon after quitting.  People who quit may see an improvement in their overall quality of life. Some people choose to quit all at once. Other options include nicotine replacement products, such as patches, gum, and nasal sprays. Do not use these products without first checking with your caregiver. QUITTING SMOKING It is not easy to quit smoking. Nicotine is addicting, and longtime  habits are hard to change. To start, you can write down all your reasons for quitting, tell your family and friends you want to quit, and ask for their help. Throw your cigarettes away, chew gum or cinnamon sticks, keep your hands busy, and drink extra water or juice. Go for walks and practice deep breathing to relax. Think of all the money you are saving: around $1,000 a year, for the average  pack-a-day smoker. Nicotine patches and gum have been shown to improve success at efforts to stop smoking. Zyban (bupropion) is an anti-depressant drug that can be prescribed to reduce nicotine withdrawal symptoms and to suppress the urge to smoke. Smoking is an addiction with both physical and psychological effects. Joining a stop-smoking support group can help you cope with the emotional issues. For more information and advice on programs to stop smoking, call your doctor, your local hospital, or these organizations:  American Lung Association - 1-800-LUNGUSA  American Cancer Society - 1-800-ACS-2345 Document Released: 10/12/2004 Document Revised: 11/27/2011 Document Reviewed: 02/24/2013 [facility] Patient Information 2014 [place], [place].

## 2013-09-15 NOTE — Progress Notes (Signed)
Pre visit review using our clinic review tool, if applicable. No additional management support is needed unless otherwise documented below in the visit note/SLS  

## 2013-09-16 DIAGNOSIS — E669 Obesity, unspecified: Secondary | ICD-10-CM | POA: Insufficient documentation

## 2013-09-16 NOTE — Assessment & Plan Note (Signed)
Patient states she is not ready to quit at present time. We'll readdress at one-month followup for weight loss. Patient given handout on smoking cessation. Instructed patient that if she changes her mind, she should get the Nicoderm prescription refilled.

## 2013-09-16 NOTE — Assessment & Plan Note (Signed)
Second measurement of elevated diastolic blood pressure. DASH diet handout given. We'll reassess in one month.

## 2013-09-16 NOTE — Assessment & Plan Note (Signed)
DASH diet given. Patient encouraged to exercise daily. Decrease intake of foods high in cholesterol, calories and saturated fats. Patient to start orlistat or weight loss. Follow up in one month.

## 2013-09-16 NOTE — Progress Notes (Signed)
Patient ID: Nicole Mueller, female   DOB: 06/03/1971, 42 y.o.   MRN: [id number]  Patient presents to clinic today for followup on smoking cessation. At last visit, patient was switched from Chantix, which was not working, to [employer] Patient states she has not filled prescription for the patches. States she is not ready to stop smoking. States she has to get in the right mind set before she will pick up the Nicoderm patches.  Patient wants to discuss possible medications for weight loss. Patient with history of elevated BP. BP 124/90 in clinic today. Patient states that she needs to watch her diet and start exercising. Patient has not tried any over-the-counter medications for weight loss.  Past Medical History  Diagnosis Date  . Chicken pox   . GERD (gastroesophageal reflux disease)   . Elevated blood pressure   . Drug abuse in remission   . UTI (lower urinary tract infection)   . Hypolipidemia   . STD (sexually transmitted disease)     gc over 36yrs ago    Current Outpatient Prescriptions on File Prior to Visit  Medication Sig Dispense Refill  . omeprazole (PRILOSEC) 20 MG capsule Take 1 capsule (20 mg total) by mouth daily.  30 capsule  3  . nicotine (NICODERM CQ) 21 mg/24hr patch Place 1 patch (21 mg total) onto the skin daily.  28 patch  0   No current facility-administered medications on file prior to visit.    No Known Allergies  Family History  Problem Relation Age of Onset  . Alcoholism Father     Deceased  . Drug abuse Father   . Prostate cancer Father   . Hypertension Mother     Living  . Diabetes Mother   . Hyperlipidemia Mother   . Heart murmur Mother   . Alcoholism Maternal Grandfather   . Dementia Maternal Grandfather   . Arthritis Maternal Grandmother   . Breast cancer Maternal Grandmother   . Stroke Maternal Grandmother   . Hypertension Maternal Grandmother   . Stroke Maternal Aunt   . Colon cancer Maternal Aunt   . Hyperlipidemia Maternal  Aunt   . Alcoholism Maternal Uncle     x2  . Alcoholism Maternal Aunt   . Aneurysm Maternal Aunt   . Hypertension Maternal Aunt     x2  . Stroke Brother   . Drug abuse Brother   . Heart disease Brother   . Bone cancer Paternal Aunt     History   Social History  . Marital Status: Single    Spouse Name: N/A    Number of Children: N/A  . Years of Education: N/A   Social History Main Topics  . Smoking status: Current Every Day Smoker -- 1.00 packs/day    Types: Cigarettes  . Smokeless tobacco: Never Used     Comment: D/C Chantix.  Marland Kitchen Alcohol Use: No  . Drug Use: No  . Sexual Activity: Yes    Partners: Male    Birth Control/ Protection: None   Other Topics Concern  . None   Social History Narrative  . None   Review of Systems - See HPI.  All other ROS are negative.  Filed Vitals:   09/15/13 1135  BP: 124/90  Pulse: 96  Temp: 98.3 F (36.8 C)  Resp: 16   Physical Exam  Vitals reviewed. Constitutional: She is oriented to person, place, and time.  Please, well developed African American female in no acute distress.  HENT:  Head: Normocephalic.  Eyes: Conjunctivae and EOM are normal. Pupils are equal, round, and reactive to light.  Cardiovascular: Normal rate, regular rhythm, normal heart sounds and intact distal pulses.   Pulmonary/Chest: Effort normal and breath sounds normal. No respiratory distress. She has no wheezes. She has no rales. She exhibits no tenderness.  Neurological: She is alert and oriented to person, place, and time.  Skin: Skin is warm and dry. No rash noted.  Psychiatric: Affect normal.    Assessment/Plan: Smoking trying to quit Patient states she is not ready to quit at present time. We'll readdress at one-month followup for weight loss. Patient given handout on smoking cessation. Instructed patient that if she changes her mind, she should get the Nicoderm prescription refilled.  Obesity DASH diet given. Patient encouraged to exercise  daily. Decrease intake of foods high in cholesterol, calories and saturated fats. Patient to start orlistat or weight loss. Follow up in one month.  Elevated BP Second measurement of elevated diastolic blood pressure. DASH diet handout given. We'll reassess in one month.

## 2013-09-23 ENCOUNTER — Encounter: Payer: Self-pay | Admitting: Nurse Practitioner

## 2013-09-23 ENCOUNTER — Ambulatory Visit (INDEPENDENT_AMBULATORY_CARE_PROVIDER_SITE_OTHER): Payer: PRIVATE HEALTH INSURANCE | Admitting: Nurse Practitioner

## 2013-09-23 ENCOUNTER — Telehealth: Payer: Self-pay | Admitting: Nurse Practitioner

## 2013-09-23 VITALS — BP 120/80 | HR 82 | Temp 98.3°F | Ht 62.25 in | Wt 229.0 lb

## 2013-09-23 DIAGNOSIS — K219 Gastro-esophageal reflux disease without esophagitis: Secondary | ICD-10-CM

## 2013-09-23 DIAGNOSIS — R112 Nausea with vomiting, unspecified: Secondary | ICD-10-CM

## 2013-09-23 LAB — CBC
HCT: 46.8 % — ABNORMAL HIGH (ref 36.0–46.0)
HEMOGLOBIN: 15.5 g/dL — AB (ref 12.0–15.0)
MCHC: 33.2 g/dL (ref 30.0–36.0)
MCV: 85.2 fl (ref 78.0–100.0)
PLATELETS: 236 10*3/uL (ref 150.0–400.0)
RBC: 5.49 Mil/uL — ABNORMAL HIGH (ref 3.87–5.11)
RDW: 13.5 % (ref 11.5–14.6)
WBC: 8.2 10*3/uL (ref 4.5–10.5)

## 2013-09-23 LAB — H. PYLORI ANTIBODY, IGG: H Pylori IgG: NEGATIVE

## 2013-09-23 MED ORDER — OMEPRAZOLE 40 MG PO CPDR: 40.0000 mg | DELAYED_RELEASE_CAPSULE | Freq: Every day | ORAL | Status: DC

## 2013-09-23 MED ORDER — OMEPRAZOLE 20 MG PO CPDR: 20.0000 mg | DELAYED_RELEASE_CAPSULE | Freq: Two times a day (BID) | ORAL | Status: DC

## 2013-09-23 NOTE — Telephone Encounter (Signed)
Neg H pylori test Hgb elevated-possible dehydration at time of test No change in [place].

## 2013-09-23 NOTE — Patient Instructions (Signed)
Start omeprazole 40 mg daily for at least 1 month. Avoid food triggers-fried foods, animal fats, large meals, alcoholic beverages & others. Start probiotic-Align or culterelle daily for at least 8 weeks. Decrease cigarette smoking by using nicotene patches & vapor cigs. Our office will call you with lab results and treatment if necessary. Feel better!  Diet for Gastroesophageal Reflux Disease, Adult Reflux (acid reflux) is when acid from your stomach flows up into the esophagus. When acid comes in contact with the esophagus, the acid causes irritation and soreness (inflammation) in the esophagus. When reflux happens often or so severely that it causes damage to the esophagus, it is called gastroesophageal reflux disease (GERD). Nutrition therapy can help ease the discomfort of GERD. FOODS OR DRINKS TO AVOID OR LIMIT  Smoking or chewing tobacco. Nicotine is one of the most potent stimulants to acid production in the gastrointestinal tract.  Caffeinated and decaffeinated coffee and black tea.  Regular or low-calorie carbonated beverages or energy drinks (caffeine-free carbonated beverages are allowed).   Strong spices, such as black pepper, white pepper, red pepper, cayenne, curry powder, and chili powder.  Peppermint or spearmint.  Chocolate.  High-fat foods, including meats and fried foods. Extra added fats including oils, butter, salad dressings, and nuts. Limit these to less than 8 tsp per day.  Fruits and vegetables if they are not tolerated, such as citrus fruits or tomatoes.  Alcohol.  Any food that seems to aggravate your condition. If you have questions regarding your diet, call your caregiver or a registered dietitian. OTHER THINGS THAT MAY HELP GERD INCLUDE:   Eating your meals slowly, in a relaxed setting.  Eating 5 to 6 small meals per day instead of 3 large meals.  Eliminating food for a period of time if it causes distress.  Not lying down until 3 hours after eating a  meal.  Keeping the head of your bed raised 6 to 9 inches (15 to 23 cm) by using a foam wedge or blocks under the legs of the bed. Lying flat may make symptoms worse.  Being physically active. Weight loss may be helpful in reducing reflux in overweight or obese adults.  Wear loose fitting clothing EXAMPLE MEAL PLAN This meal plan is approximately 2,000 calories based on [email or web address] meal planning guidelines. Breakfast   cup cooked oatmeal.  1 cup strawberries.  1 cup low-fat milk.  1 oz almonds. Snack  1 cup cucumber slices.  6 oz yogurt (made from low-fat or fat-free milk). Lunch  2 slice whole-wheat bread.  2 oz sliced [place].  2 tsp mayonnaise.  1 cup blueberries.  1 cup snap peas. Snack  6 whole-wheat crackers.  1 oz string cheese. Dinner   cup brown rice.  1 cup mixed veggies.  1 tsp olive oil.  3 oz grilled fish. Document Released: 09/04/2005 Document Revised: 11/27/2011 Document Reviewed: 07/21/2011 [facility] Patient Information 2014 [place], [place].

## 2013-09-23 NOTE — Progress Notes (Signed)
Subjective:     Nicole Mueller is a 43 y.o. female who presents for evaluation of nausea and vomiting.Onset of symptoms was yesterday. Patient describes nausea as moderate. Vomiting has occurred several times over the past 24 hours. Vomitus is described as normal gastric contents. Symptoms were proceeded by worsened heart burn.     The following portions of the patient's history were reviewed and updated as appropriate: allergies, current medications, past family history, past medical history, past social history, past surgical history and problem list.  Review of Systems Constitutional: negative for fevers Ears, nose, mouth, throat, and face: negative for sore throat Gastrointestinal: positive for abdominal pain, dyspepsia, nausea, reflux symptoms and vomiting, negative for melena Behavioral/Psych: negative for excessive alcohol consumption and had some red wine the day before symptoms started.     Objective:  VS: bp 120/80 P 82 wt 229 (down 7 pounds) General appearance: alert, cooperative, appears stated age and no distress Head: Normocephalic, without obvious abnormality, atraumatic Eyes: negative findings: lids and lashes normal and conjunctivae and sclerae normal Throat: lips, mucosa, and tongue normal; teeth and gums normal Neck: no adenopathy, supple, symmetrical, trachea midline and thyroid not enlarged, symmetric, no tenderness/mass/nodules Lungs: clear to auscultation bilaterally Heart: regular rate and rhythm, S1, S2 normal, no murmur, click, rub or gallop Abdomen: soft, non-tender; bowel sounds normal; no masses,  no organomegaly Lymph nodes: no cervical or supraclavicular AD   Assessment:       Abdominal pain, N&V DD: GERD, PUD, Gastritis, H. Pylori infection    Smoker, motivated to quit  Plan:     See pt instructions. Encouraged to use nicotene patches & vapor cigarettes to decrease amount of cigarettes smoked. F/u in 3 weeks w/ Elyn Aquas.

## 2013-09-23 NOTE — Progress Notes (Signed)
Pre-visit discussion using our clinic review tool. No additional management support is needed unless otherwise documented below in the visit note.  

## 2013-09-24 NOTE — Telephone Encounter (Signed)
Patient notified of results.

## 2013-10-17 ENCOUNTER — Ambulatory Visit (INDEPENDENT_AMBULATORY_CARE_PROVIDER_SITE_OTHER): Payer: PRIVATE HEALTH INSURANCE | Admitting: Physician Assistant

## 2013-10-17 ENCOUNTER — Encounter: Payer: Self-pay | Admitting: Physician Assistant

## 2013-10-17 DIAGNOSIS — F17200 Nicotine dependence, unspecified, uncomplicated: Secondary | ICD-10-CM

## 2013-10-17 DIAGNOSIS — Z720 Tobacco use: Secondary | ICD-10-CM

## 2013-10-17 DIAGNOSIS — R030 Elevated blood-pressure reading, without diagnosis of hypertension: Secondary | ICD-10-CM

## 2013-10-17 DIAGNOSIS — IMO0001 Reserved for inherently not codable concepts without codable children: Secondary | ICD-10-CM

## 2013-10-17 MED ORDER — LORCASERIN HCL 10 MG PO TABS: 1.0000 | ORAL_TABLET | Freq: Two times a day (BID) | ORAL | Status: DC

## 2013-10-17 NOTE — Assessment & Plan Note (Signed)
BP looks good in clinic today.  Encourage continued diet/exercise. Will recheck at Weight Loss follow-up in 1 month.

## 2013-10-17 NOTE — Progress Notes (Signed)
Patient presents to clinic today for 44-month follow-up of morbid obesity and elevated BP.  Patient was placed on Orlistat at last visit 26-month ago for weight loss.  Patient states she has not been able to find the medication because the manufacturer has a hold on the product.  Patient would like to discuss other options.  Patient denies hx of thyroid disorder.  Recent TSH level was normal.  Patient is dieting and exercising but still with difficulty losing weight.  Patient does have transiently elevated BP in clinic.  Patient's BP is normotensive.  Upon review of vital signs, recent BP recorded at acute visit with another provider was normotensive as well.  Patient denies headache, chest pain, palpitations, vision change, LH or dizziness.    Patient is currently working on smoking cessation with Nicoderm patches.  Patient states it is definitely helping.  Has reduced the number of cigarettes per day to a couple.  Is on the medium-dose, step-down patch currently.  Past Medical History  Diagnosis Date  . Chicken pox   . GERD (gastroesophageal reflux disease)   . Elevated blood pressure   . Drug abuse in remission   . UTI (lower urinary tract infection)   . Hypolipidemia   . STD (sexually transmitted disease)     gc over 32yrs ago    Current Outpatient Prescriptions on File Prior to Visit  Medication Sig Dispense Refill  . nicotine (NICODERM CQ) 21 mg/24hr patch Place 1 patch (21 mg total) onto the skin daily.  28 patch  0  . omeprazole (PRILOSEC) 40 MG capsule Take 1 capsule (40 mg total) by mouth daily.  30 capsule  1  . orlistat (ALLI) 60 MG capsule Take 1 capsule (60 mg total) by mouth 3 (three) times daily with meals.  180 capsule  2   No current facility-administered medications on file prior to visit.    No Known Allergies  Family History  Problem Relation Age of Onset  . Alcoholism Father     Deceased  . Drug abuse Father   . Prostate cancer Father   . Hypertension Mother      Living  . Diabetes Mother   . Hyperlipidemia Mother   . Heart murmur Mother   . Alcoholism Maternal Grandfather   . Dementia Maternal Grandfather   . Arthritis Maternal Grandmother   . Breast cancer Maternal Grandmother   . Stroke Maternal Grandmother   . Hypertension Maternal Grandmother   . Stroke Maternal Aunt   . Colon cancer Maternal Aunt   . Hyperlipidemia Maternal Aunt   . Alcoholism Maternal Uncle     x2  . Alcoholism Maternal Aunt   . Aneurysm Maternal Aunt   . Hypertension Maternal Aunt     x2  . Stroke Brother   . Drug abuse Brother   . Heart disease Brother   . Bone cancer Paternal Aunt     History   Social History  . Marital Status: Single    Spouse Name: N/A    Number of Children: N/A  . Years of Education: N/A   Social History Main Topics  . Smoking status: Current Every Day Smoker -- 1.00 packs/day    Types: Cigarettes  . Smokeless tobacco: Never Used     Comment: D/C Chantix.  Marland Kitchen Alcohol Use: No  . Drug Use: No  . Sexual Activity: Yes    Partners: Male    Birth Control/ Protection: None   Other Topics Concern  . None  Social History Narrative  . None    Review of Systems - See HPI.  All other ROS are negative.  Filed Vitals:   10/17/13 0930  BP: 128/86  Pulse: 90  Temp: 98.1 F (36.7 C)  Resp: 16    Physical Exam  Recent Results (from the past 2160 hour(s))  H. PYLORI ANTIBODY, IGG     Status: None   Collection Time    09/23/13  1:53 PM      Result Value Range   H Pylori IgG Negative  Negative  CBC     Status: Abnormal   Collection Time    09/23/13  1:53 PM      Result Value Range   WBC 8.2  4.5 - 10.5 K/uL   RBC 5.49 (*) 3.87 - 5.11 Mil/uL   Platelets 236.0  150.0 - 400.0 K/uL   Hemoglobin 15.5 (*) 12.0 - 15.0 g/dL   HCT 46.8 (*) 36.0 - 46.0 %   MCV 85.2  78.0 - 100.0 fl   MCHC 33.2  30.0 - 36.0 g/dL   RDW 13.5  11.5 - 14.6 %    Assessment/Plan: No problem-specific assessment & plan notes found for this  encounter.

## 2013-10-17 NOTE — Assessment & Plan Note (Signed)
Will Rx Belviq. Voucher also given for free 15-day trial.  Patient to take as prescribed.  Follow-up in 1 month.  Patient educated on side effects of medication.  Patient alerted to stop medication and call the office immediately if she is developing depressed mood or suicidal thought.

## 2013-10-17 NOTE — Progress Notes (Signed)
Pre visit review using our clinic review tool, if applicable. No additional management support is needed unless otherwise documented below in the visit note/SLS  

## 2013-10-17 NOTE — Patient Instructions (Signed)
Please start taking the Belviq as prescribed.  Continue watching your diet and trying to exercise daily.  Follow-up in 1 month to reassess weight loss.  If you develop headache, nausea, depressed mood or thought of suicide, please stop the medication as call our office.

## 2013-10-17 NOTE — Assessment & Plan Note (Signed)
Currently on Nicoderm.  Doing well.  Will reassess treatment at next visit in 1 month.

## 2013-10-22 ENCOUNTER — Telehealth: Payer: Self-pay | Admitting: Physician Assistant

## 2013-10-22 NOTE — Telephone Encounter (Signed)
Relevant patient education assigned to patient using Emmi. ° °

## 2013-11-19 ENCOUNTER — Ambulatory Visit: Payer: PRIVATE HEALTH INSURANCE | Admitting: Physician Assistant

## 2013-12-11 ENCOUNTER — Ambulatory Visit: Payer: PRIVATE HEALTH INSURANCE | Admitting: Physician Assistant

## 2013-12-17 ENCOUNTER — Encounter: Payer: Self-pay | Admitting: Physician Assistant

## 2013-12-17 ENCOUNTER — Ambulatory Visit (INDEPENDENT_AMBULATORY_CARE_PROVIDER_SITE_OTHER): Payer: PRIVATE HEALTH INSURANCE | Admitting: Physician Assistant

## 2013-12-17 NOTE — Assessment & Plan Note (Signed)
Will restart Belviq.  Will start with once daily dosing for 1-2 weeks then increase to twice daily dosing. Patient educated that fatigue will hopefully resolve once the body becomes adjusted to medication.  Patient to call in 2-3 weeks to let us know how medication is doing.  If fatigue becomes severe or is persistent, will attempt trial of phentermine. Patient to continue diet and exercise.  Smoking cessation again encouraged.

## 2013-12-17 NOTE — Patient Instructions (Signed)
Please restart Belviq -- try taking 1 tablet once daily for 1 week.  Then increase to twice daily dosing.  Continue diet and exercise.  If fatigue recurs and is persistent, call the office.  We will attempt a trial of Phentermine.  Please read information below about tobacco cessation.  Smoking Cessation, Tips for Success If you are ready to quit smoking, congratulations! You have chosen to help yourself be healthier. Cigarettes bring nicotine, tar, carbon monoxide, and other irritants into your body. Your lungs, heart, and blood vessels will be able to work better without these poisons. There are many different ways to quit smoking. Nicotine gum, nicotine patches, a nicotine inhaler, or nicotine nasal spray can help with physical craving. Hypnosis, support groups, and medicines help break the habit of smoking. WHAT THINGS CAN I DO TO MAKE QUITTING EASIER?  Here are some tips to help you quit for good:  Pick a date when you will quit smoking completely. Tell all of your friends and family about your plan to quit on that date.  Do not try to slowly cut down on the number of cigarettes you are smoking. Pick a quit date and quit smoking completely starting on that day.  Throw away all cigarettes.   Clean and remove all ashtrays from your home, work, and car.   On a card, write down your reasons for quitting. Carry the card with you and read it when you get the urge to smoke.   Cleanse your body of nicotine. Drink enough water and fluids to keep your urine clear or pale yellow. Do this after quitting to flush the nicotine from your body.   Learn to predict your moods. Do not let a bad situation be your excuse to have a cigarette. Some situations in your life might tempt you into wanting a cigarette.   Never have "just one" cigarette. It leads to wanting another and another. Remind yourself of your decision to quit.   Change habits associated with smoking. If you smoked while driving or when  feeling stressed, try other activities to replace smoking. Stand up when drinking your coffee. Brush your teeth after eating. Sit in a different chair when you read the paper. Avoid alcohol while trying to quit, and try to drink fewer caffeinated beverages. Alcohol and caffeine may urge you to smoke.   Avoid foods and drinks that can trigger a desire to smoke, such as sugary or spicy foods and alcohol.   Ask people who smoke not to smoke around you.   Have something planned to do right after eating or having a cup of coffee. For example, plan to take a walk or exercise.   Try a relaxation exercise to calm you down and decrease your stress. Remember, you may be tense and nervous for the first 2 weeks after you quit, but this will pass.   Find new activities to keep your hands busy. Play with a pen, coin, or rubber band. Doodle or draw things on paper.   Brush your teeth right after eating. This will help cut down on the craving for the taste of tobacco after meals. You can also try mouthwash.   Use oral substitutes in place of cigarettes. Try using lemon drops, carrots, cinnamon sticks, or chewing gum. Keep them handy so they are available when you have the urge to smoke.   When you have the urge to smoke, try deep breathing.   Designate your home as a nonsmoking area.   If  you are a heavy smoker, ask your health care provider about a prescription for nicotine chewing gum. It can ease your withdrawal from nicotine.   Reward yourself. Set aside the cigarette money you save and buy yourself something nice.   Look for support from others. Join a support group or smoking cessation program. Ask someone at home or at work to help you with your plan to quit smoking.   Always ask yourself, "Do I need this cigarette or is this just a reflex?" Tell yourself, "Today, I choose not to smoke," or "I do not want to smoke." You are reminding yourself of your decision to quit.  Do not replace  cigarette smoking with electronic cigarettes (commonly called e-cigarettes). The safety of e-cigarettes is unknown, and some may contain harmful chemicals.  If you relapse, do not give up! Plan ahead and think about what you will do the next time you get the urge to smoke.  HOW WILL I FEEL WHEN I QUIT SMOKING? You may have symptoms of withdrawal because your body is used to nicotine (the addictive substance in cigarettes). You may crave cigarettes, be irritable, feel very hungry, cough often, get headaches, or have difficulty concentrating. The withdrawal symptoms are only temporary. They are strongest when you first quit but will go away within 10 14 days. When withdrawal symptoms occur, stay in control. Think about your reasons for quitting. Remind yourself that these are signs that your body is healing and getting used to being without cigarettes. Remember that withdrawal symptoms are easier to treat than the major diseases that smoking can cause.  Even after the withdrawal is over, expect periodic urges to smoke. However, these cravings are generally short lived and will go away whether you smoke or not. Do not smoke!  WHAT RESOURCES ARE AVAILABLE TO HELP ME QUIT SMOKING? Your health care provider can direct you to community resources or hospitals for support, which may include:  Group support.  Education.  Hypnosis.  Therapy. Document Released: 06/02/2004 Document Revised: 06/25/2013 Document Reviewed: 02/20/2013 [facility] Patient Information 2014 [place], [place].

## 2013-12-17 NOTE — Progress Notes (Signed)
Patient presents to clinic today for follow-up of morbid obesity.  Patient started on Belviq ~ 1 month ago.  States she took medication as directed for 1 week, but stopped medication because it made her feel somewhat fatigued.  Has been exercising daily now and is eating a healthier, well-balanced diet.  Would like to discuss restarting Belviq.    Past Medical History  Diagnosis Date  . Chicken pox   . GERD (gastroesophageal reflux disease)   . Elevated blood pressure   . Drug abuse in remission   . UTI (lower urinary tract infection)   . Hypolipidemia   . STD (sexually transmitted disease)     gc over 46yrs ago    Current Outpatient Prescriptions on File Prior to Visit  Medication Sig Dispense Refill  . nicotine (NICODERM CQ) 21 mg/24hr patch Place 1 patch (21 mg total) onto the skin daily.  28 patch  0  . omeprazole (PRILOSEC) 40 MG capsule Take 1 capsule (40 mg total) by mouth daily.  30 capsule  1  . orlistat (ALLI) 60 MG capsule Take 1 capsule (60 mg total) by mouth 3 (three) times daily with meals.  180 capsule  2  . Lorcaserin HCl 10 MG TABS Take 1 tablet by mouth 2 (two) times daily.  60 tablet  1   No current facility-administered medications on file prior to visit.    No Known Allergies  Family History  Problem Relation Age of Onset  . Alcoholism Father     Deceased  . Drug abuse Father   . Prostate cancer Father   . Hypertension Mother     Living  . Diabetes Mother   . Hyperlipidemia Mother   . Heart murmur Mother   . Alcoholism Maternal Grandfather   . Dementia Maternal Grandfather   . Arthritis Maternal Grandmother   . Breast cancer Maternal Grandmother   . Stroke Maternal Grandmother   . Hypertension Maternal Grandmother   . Stroke Maternal Aunt   . Colon cancer Maternal Aunt   . Hyperlipidemia Maternal Aunt   . Alcoholism Maternal Uncle     x2  . Alcoholism Maternal Aunt   . Aneurysm Maternal Aunt   . Hypertension Maternal Aunt     x2  . Stroke  Brother   . Drug abuse Brother   . Heart disease Brother   . Bone cancer Paternal Aunt     History   Social History  . Marital Status: Single    Spouse Name: N/A    Number of Children: N/A  . Years of Education: N/A   Social History Main Topics  . Smoking status: Current Every Day Smoker -- 1.00 packs/day    Types: Cigarettes  . Smokeless tobacco: Never Used     Comment: D/C Chantix.  Marland Kitchen Alcohol Use: No  . Drug Use: No  . Sexual Activity: Yes    Partners: Male    Birth Control/ Protection: None   Other Topics Concern  . None   Social History Narrative  . None   Review of Systems - See HPI.  All other ROS are negative.  BP 122/88  Pulse 78  Temp(Src) 97.8 F (36.6 C) (Oral)  Resp 16  Ht 5' 2.25" (1.581 m)  Wt 228 lb (103.42 kg)  BMI 41.38 kg/m2  SpO2 97%  LMP 12/15/2013  Physical Exam  Vitals reviewed. Constitutional: She is oriented to person, place, and time and well-developed, well-nourished, and in no distress.  HENT:  Head: Normocephalic  and atraumatic.  Eyes: Conjunctivae are normal. Pupils are equal, round, and reactive to light.  Neck: Neck supple.  Cardiovascular: Normal rate, regular rhythm and normal heart sounds.   Pulmonary/Chest: Effort normal and breath sounds normal.  Neurological: She is alert and oriented to person, place, and time.  Skin: Skin is warm and dry. No rash noted.  Psychiatric: Affect normal.    Recent Results (from the past 2160 hour(s))  H. PYLORI ANTIBODY, IGG     Status: None   Collection Time    09/23/13  1:53 PM      Result Value Ref Range   H Pylori IgG Negative  Negative  CBC     Status: Abnormal   Collection Time    09/23/13  1:53 PM      Result Value Ref Range   WBC 8.2  4.5 - 10.5 K/uL   RBC 5.49 (*) 3.87 - 5.11 Mil/uL   Platelets 236.0  150.0 - 400.0 K/uL   Hemoglobin 15.5 (*) 12.0 - 15.0 g/dL   HCT 46.8 (*) 36.0 - 46.0 %   MCV 85.2  78.0 - 100.0 fl   MCHC 33.2  30.0 - 36.0 g/dL   RDW 13.5  11.5 - 14.6  %   Assessment/Plan: Morbid obesity Will restart Belviq.  Will start with once daily dosing for 1-2 weeks then increase to twice daily dosing. Patient educated that fatigue will hopefully resolve once the body becomes adjusted to medication.  Patient to call in 2-3 weeks to let us know how medication is doing.  If fatigue becomes severe or is persistent, will attempt trial of phentermine. Patient to continue diet and exercise.  Smoking cessation again encouraged.

## 2013-12-17 NOTE — Progress Notes (Signed)
Pre visit review using our clinic review tool, if applicable. No additional management support is needed unless otherwise documented below in the visit note/SLS  

## 2014-04-11 IMAGING — MG MM DIGITAL SCREENING
5 series · 5 of 5 positions shown · non-contrast
Comparison: Previous exam(s).

CLINICAL DATA: Screening.

EXAM:
DIGITAL SCREENING BILATERAL MAMMOGRAM WITH CAD

[R CC (1 of 2)]
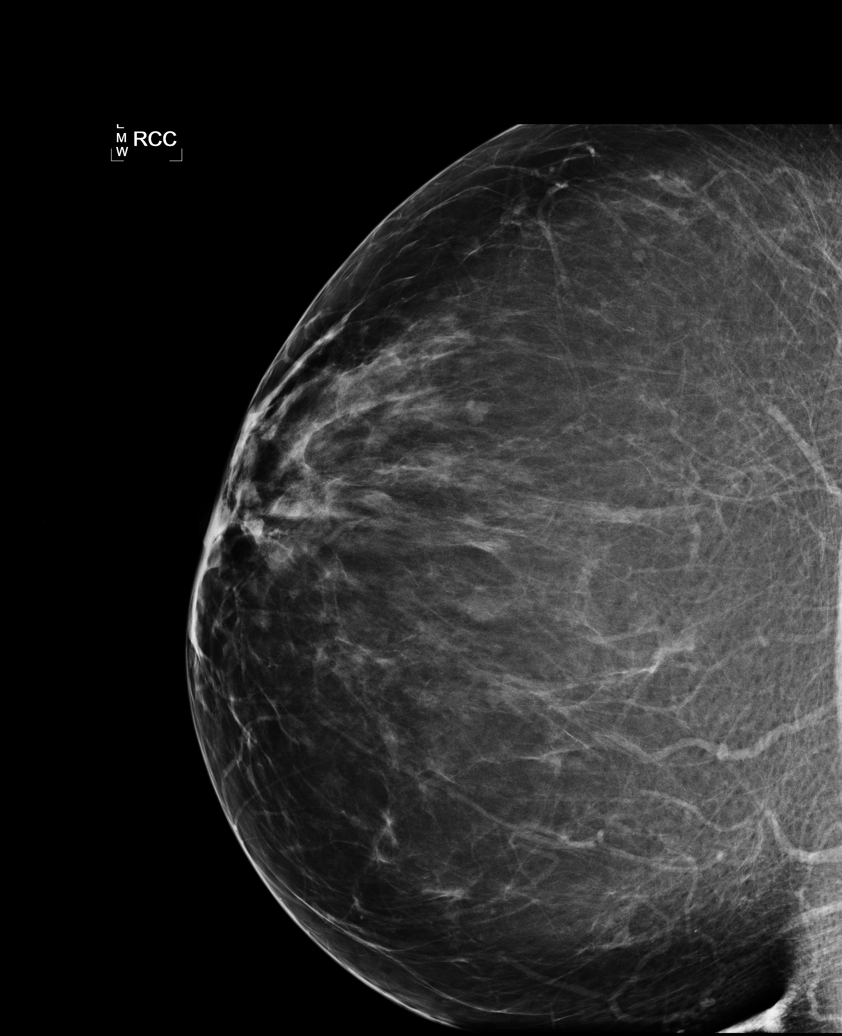

[L CC]
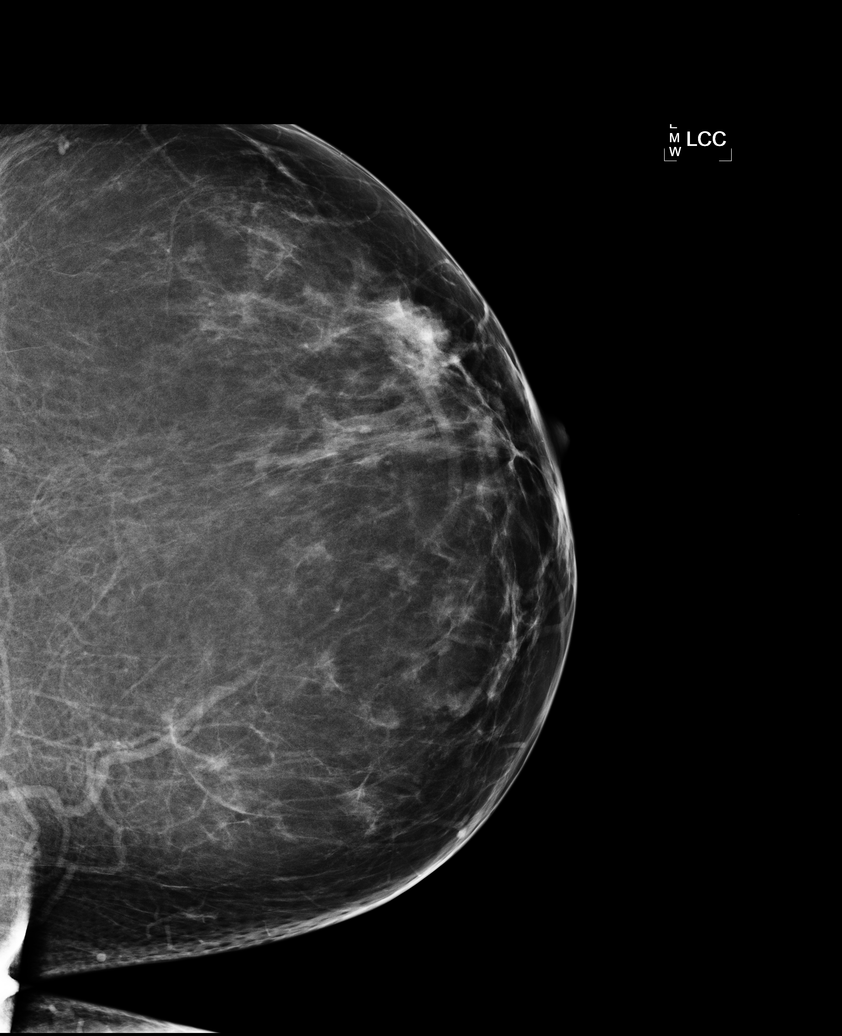

[L MLO]
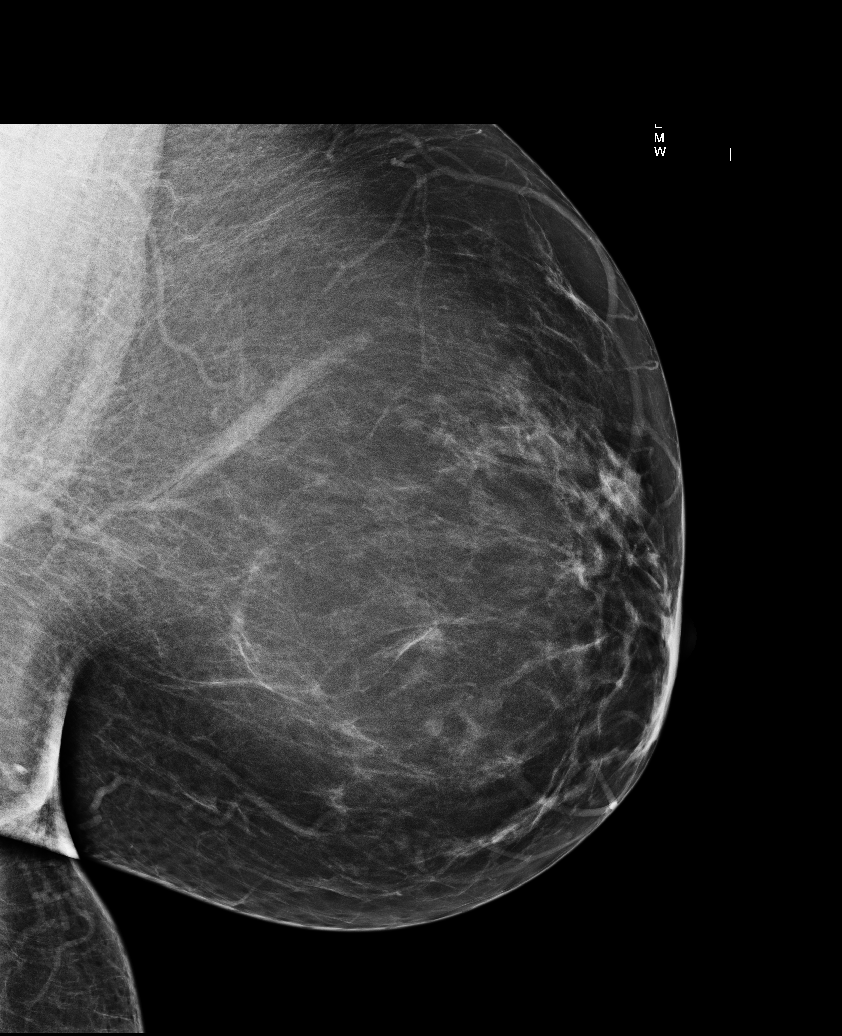

[R MLO]
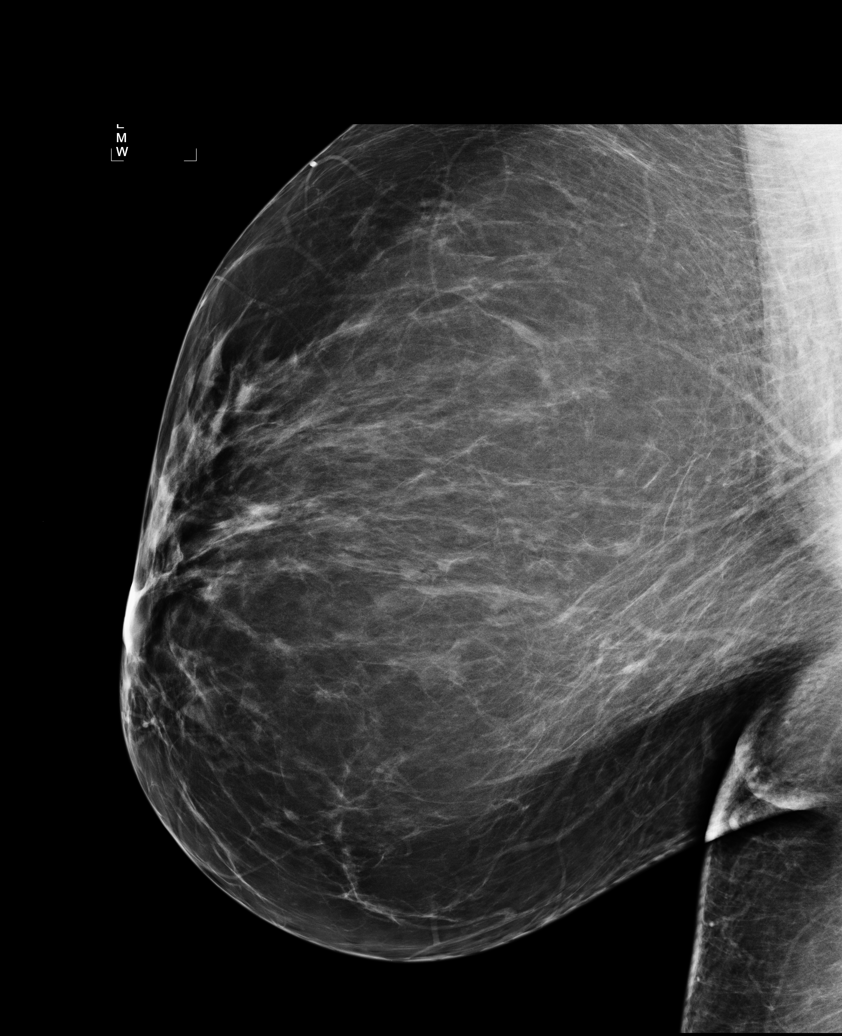

[R CC (2 of 2)]
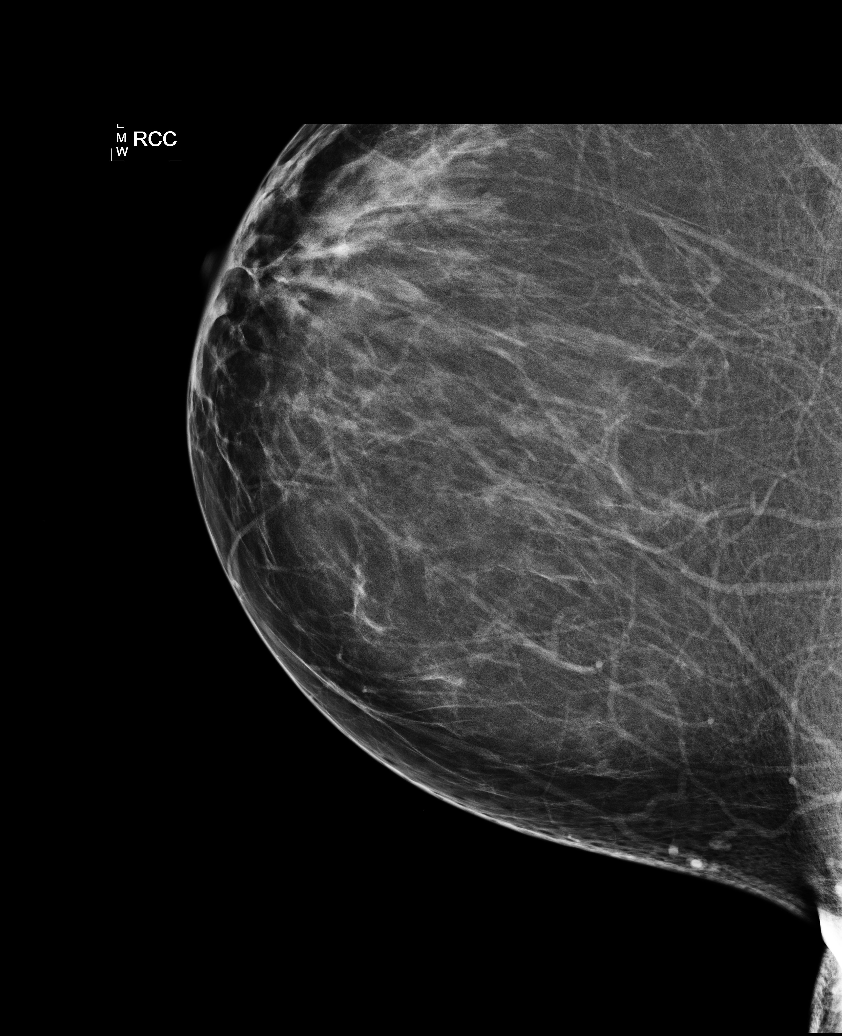

[5 of 5 positions shown; findings below may reference images not displayed]

ACR Breast Density Category b: There are scattered areas of
fibroglandular density.
FINDINGS: There are no findings suspicious for malignancy. Images were
processed with CAD.
IMPRESSION: No mammographic evidence of malignancy. A result letter of this
screening mammogram will be mailed directly to the patient.

RECOMMENDATION:
Screening mammogram in one year. (Code:[id number])

BI-RADS CATEGORY  1: Negative

## 2014-06-09 ENCOUNTER — Ambulatory Visit: Payer: PRIVATE HEALTH INSURANCE | Admitting: Certified Nurse Midwife

## 2014-06-09 ENCOUNTER — Encounter: Payer: Self-pay | Admitting: Certified Nurse Midwife

## 2014-07-01 ENCOUNTER — Telehealth: Payer: Self-pay | Admitting: Physician Assistant

## 2014-07-01 ENCOUNTER — Encounter: Payer: Self-pay | Admitting: Physician Assistant

## 2014-07-01 ENCOUNTER — Ambulatory Visit (INDEPENDENT_AMBULATORY_CARE_PROVIDER_SITE_OTHER): Payer: PRIVATE HEALTH INSURANCE | Admitting: Physician Assistant

## 2014-07-01 VITALS — BP 118/83 | HR 71 | Temp 98.3°F | Resp 16 | Ht 62.25 in | Wt 227.4 lb

## 2014-07-01 DIAGNOSIS — Z23 Encounter for immunization: Secondary | ICD-10-CM

## 2014-07-01 DIAGNOSIS — Z720 Tobacco use: Secondary | ICD-10-CM

## 2014-07-01 DIAGNOSIS — N3000 Acute cystitis without hematuria: Secondary | ICD-10-CM

## 2014-07-01 DIAGNOSIS — Z0000 Encounter for general adult medical examination without abnormal findings: Secondary | ICD-10-CM

## 2014-07-01 DIAGNOSIS — N390 Urinary tract infection, site not specified: Secondary | ICD-10-CM

## 2014-07-01 LAB — URINALYSIS, ROUTINE W REFLEX MICROSCOPIC
Bilirubin Urine: NEGATIVE
Hgb urine dipstick: NEGATIVE
KETONES UR: NEGATIVE
Leukocytes, UA: NEGATIVE
NITRITE: POSITIVE — AB
RBC / HPF: NONE SEEN (ref 0–?)
Specific Gravity, Urine: 1.025 (ref 1.000–1.030)
Total Protein, Urine: NEGATIVE
Urine Glucose: NEGATIVE
Urobilinogen, UA: 0.2 (ref 0.0–1.0)
pH: 6.5 (ref 5.0–8.0)

## 2014-07-01 LAB — CBC
HEMATOCRIT: 45.1 % (ref 36.0–46.0)
HEMOGLOBIN: 14.4 g/dL (ref 12.0–15.0)
MCHC: 32 g/dL (ref 30.0–36.0)
MCV: 86.8 fl (ref 78.0–100.0)
Platelets: 208 10*3/uL (ref 150.0–400.0)
RBC: 5.2 Mil/uL — ABNORMAL HIGH (ref 3.87–5.11)
RDW: 13.6 % (ref 11.5–15.5)
WBC: 8.4 10*3/uL (ref 4.0–10.5)

## 2014-07-01 LAB — LIPID PANEL W/DIRECT LDL/HDL RATIO
Cholesterol: 173 mg/dL (ref 0–200)
Direct LDL: 117 mg/dL — ABNORMAL HIGH
HDL: 43 mg/dL (ref >39)
LDL:HDL Ratio: 2.7 Ratio
Total Chol/HDL Ratio: 4 Ratio
Triglycerides: 101 mg/dL (ref <150)

## 2014-07-01 LAB — BASIC METABOLIC PANEL WITH GFR
BUN: 10 mg/dL (ref 6–23)
CHLORIDE: 106 meq/L (ref 96–112)
CO2: 26 meq/L (ref 19–32)
CREATININE: 0.77 mg/dL (ref 0.50–1.10)
Calcium: 9.1 mg/dL (ref 8.4–10.5)
GFR, Est African American: >89 mL/min
GFR, Est Non African American: >89 mL/min
Glucose, Bld: 80 mg/dL (ref 70–99)
Potassium: 4.3 mEq/L (ref 3.5–5.3)
Sodium: 138 mEq/L (ref 135–145)

## 2014-07-01 LAB — HEPATIC FUNCTION PANEL
ALBUMIN: 3.2 g/dL — AB (ref 3.5–5.2)
ALK PHOS: 102 U/L (ref 39–117)
ALT: 20 U/L (ref 0–35)
AST: 19 U/L (ref 0–37)
Bilirubin, Direct: 0 mg/dL (ref 0.0–0.3)
Total Bilirubin: 0.4 mg/dL (ref 0.2–1.2)
Total Protein: 7.4 g/dL (ref 6.0–8.3)

## 2014-07-01 LAB — TSH: TSH: 1.42 u[IU]/mL (ref 0.35–4.50)

## 2014-07-01 LAB — HEMOGLOBIN A1C: Hgb A1c MFr Bld: 5.7 % (ref 4.6–6.5)

## 2014-07-01 MED ORDER — VARENICLINE TARTRATE 0.5 MG X 11 & 1 MG X 42 PO MISC: ORAL | Status: DC

## 2014-07-01 MED ORDER — CIPROFLOXACIN HCL 500 MG PO TABS: 500.0000 mg | ORAL_TABLET | Freq: Two times a day (BID) | ORAL | Status: DC

## 2014-07-01 MED ORDER — PHENTERMINE HCL 30 MG PO CAPS: 30.0000 mg | ORAL_CAPSULE | ORAL | Status: DC

## 2014-07-01 NOTE — Assessment & Plan Note (Signed)
Medical history reviewed and updated.  Flu shot given. Patient to return to clinic in 1 month to update TDaP. Followed by OB/GYN for PAP and Mammography.  Will obtain fasting labs at today's visit.

## 2014-07-01 NOTE — Assessment & Plan Note (Signed)
Will restart Chantix now that patient feels truly ready to quit.

## 2014-07-01 NOTE — Assessment & Plan Note (Signed)
BP looks great.  Rx Phentermine 30 mg QD x 1 month.  Follow-up in 1 month for reassessment.

## 2014-07-01 NOTE — Telephone Encounter (Signed)
Message copied by Raiford Noble on Wed Jul 01, 2014  5:00 PM ------      Message from: Nicole Mueller, DAVID J      Created: Wed Jul 01, 2014  4:57 PM       Pt states that's she's still having frequent urination. ------

## 2014-07-01 NOTE — Patient Instructions (Signed)
Please stop by the lab for blood work.  I will call you with your results. Please start the Chantix as directed for smoking cessation.  1-2 weeks later, begin the Phentermine.  Take it as directed.  Continue with diet and exercise.  Follow-up with me in 4-6 weeks.  If you notice difficulty sleeping or racing heart with Phentermine, please stop medication and call the office.  For your mouth, get some over-the-counter Peroxyl mouthwash to use twice daily.  Preventive Care for Adults A healthy lifestyle and preventive care can promote health and wellness. Preventive health guidelines for women include the following key practices.  A routine yearly physical is a good way to check with your health care provider about your health and preventive screening. It is a chance to share any concerns and updates on your health and to receive a thorough exam.  Visit your dentist for a routine exam and preventive care every 6 months. Brush your teeth twice a day and floss once a day. Good oral hygiene prevents tooth decay and gum disease.  The frequency of eye exams is based on your age, health, family medical history, use of contact lenses, and other factors. Follow your health care provider's recommendations for frequency of eye exams.  Eat a healthy diet. Foods like vegetables, fruits, whole grains, low-fat dairy products, and lean protein foods contain the nutrients you need without too many calories. Decrease your intake of foods high in solid fats, added sugars, and salt. Eat the right amount of calories for you.Get information about a proper diet from your health care provider, if necessary.  Regular physical exercise is one of the most important things you can do for your health. Most adults should get at least 150 minutes of moderate-intensity exercise (any activity that increases your heart rate and causes you to sweat) each week. In addition, most adults need muscle-strengthening exercises on 2 or more days  a week.  Maintain a healthy weight. The body mass index (BMI) is a screening tool to identify possible weight problems. It provides an estimate of body fat based on height and weight. Your health care provider can find your BMI and can help you achieve or maintain a healthy weight.For adults 20 years and older:  A BMI below 18.5 is considered underweight.  A BMI of 18.5 to 24.9 is normal.  A BMI of 25 to 29.9 is considered overweight.  A BMI of 30 and above is considered obese.  Maintain normal blood lipids and cholesterol levels by exercising and minimizing your intake of saturated fat. Eat a balanced diet with plenty of fruit and vegetables. Blood tests for lipids and cholesterol should begin at age 25 and be repeated every 5 years. If your lipid or cholesterol levels are high, you are over 50, or you are at high risk for heart disease, you may need your cholesterol levels checked more frequently.Ongoing high lipid and cholesterol levels should be treated with medicines if diet and exercise are not working.  If you smoke, find out from your health care provider how to quit. If you do not use tobacco, do not start.  Lung cancer screening is recommended for adults aged 55-80 years who are at high risk for developing lung cancer because of a history of smoking. A yearly low-dose CT scan of the lungs is recommended for people who have at least a 30-pack-year history of smoking and are a current smoker or have quit within the past 15 years. A pack year  of smoking is smoking an average of 1 pack of cigarettes a day for 1 year (for example: 1 pack a day for 30 years or 2 packs a day for 15 years). Yearly screening should continue until the smoker has stopped smoking for at least 15 years. Yearly screening should be stopped for people who develop a health problem that would prevent them from having lung cancer treatment.  If you are pregnant, do not drink alcohol. If you are breastfeeding, be very  cautious about drinking alcohol. If you are not pregnant and choose to drink alcohol, do not have more than 1 drink per day. One drink is considered to be 12 ounces (355 mL) of beer, 5 ounces (148 mL) of wine, or 1.5 ounces (44 mL) of liquor.  Avoid use of street drugs. Do not share needles with anyone. Ask for help if you need support or instructions about stopping the use of drugs.  High blood pressure causes heart disease and increases the risk of stroke. Your blood pressure should be checked at least every 1 to 2 years. Ongoing high blood pressure should be treated with medicines if weight loss and exercise do not work.  If you are 45-15 years old, ask your health care provider if you should take aspirin to prevent strokes.  Diabetes screening involves taking a blood sample to check your fasting blood sugar level. This should be done once every 3 years, after age 47, if you are within normal weight and without risk factors for diabetes. Testing should be considered at a younger age or be carried out more frequently if you are overweight and have at least 1 risk factor for diabetes.  Breast cancer screening is essential preventive care for women. You should practice "breast self-awareness." This means understanding the normal appearance and feel of your breasts and may include breast self-examination. Any changes detected, no matter how small, should be reported to a health care provider. Women in their 48s and 30s should have a clinical breast exam (CBE) by a health care provider as part of a regular health exam every 1 to 3 years. After age 18, women should have a CBE every year. Starting at age 1, women should consider having a mammogram (breast X-ray test) every year. Women who have a family history of breast cancer should talk to their health care provider about genetic screening. Women at a high risk of breast cancer should talk to their health care providers about having an MRI and a mammogram  every year.  Breast cancer gene (BRCA)-related cancer risk assessment is recommended for women who have family members with BRCA-related cancers. BRCA-related cancers include breast, ovarian, tubal, and peritoneal cancers. Having family members with these cancers may be associated with an increased risk for harmful changes (mutations) in the breast cancer genes BRCA1 and BRCA2. Results of the assessment will determine the need for genetic counseling and BRCA1 and BRCA2 testing.  Routine pelvic exams to screen for cancer are no longer recommended for nonpregnant women who are considered low risk for cancer of the pelvic organs (ovaries, uterus, and vagina) and who do not have symptoms. Ask your health care provider if a screening pelvic exam is right for you.  If you have had past treatment for cervical cancer or a condition that could lead to cancer, you need Pap tests and screening for cancer for at least 20 years after your treatment. If Pap tests have been discontinued, your risk factors (such as having a new  sexual partner) need to be reassessed to determine if screening should be resumed. Some women have medical problems that increase the chance of getting cervical cancer. In these cases, your health care provider may recommend more frequent screening and Pap tests.  The HPV test is an additional test that may be used for cervical cancer screening. The HPV test looks for the virus that can cause the cell changes on the cervix. The cells collected during the Pap test can be tested for HPV. The HPV test could be used to screen women aged 30 years and older, and should be used in women of any age who have unclear Pap test results. After the age of 55, women should have HPV testing at the same frequency as a Pap test.  Colorectal cancer can be detected and often prevented. Most routine colorectal cancer screening begins at the age of 60 years and continues through age 38 years. However, your health care  provider may recommend screening at an earlier age if you have risk factors for colon cancer. On a yearly basis, your health care provider may provide home test kits to check for hidden blood in the stool. Use of a small camera at the end of a tube, to directly examine the colon (sigmoidoscopy or colonoscopy), can detect the earliest forms of colorectal cancer. Talk to your health care provider about this at age 72, when routine screening begins. Direct exam of the colon should be repeated every 5-10 years through age 48 years, unless early forms of pre-cancerous polyps or small growths are found.  People who are at an increased risk for hepatitis B should be screened for this virus. You are considered at high risk for hepatitis B if:  You were born in a country where hepatitis B occurs often. Talk with your health care provider about which countries are considered high risk.  Your parents were born in a high-risk country and you have not received a shot to protect against hepatitis B (hepatitis B vaccine).  You have HIV or AIDS.  You use needles to inject street drugs.  You live with, or have sex with, someone who has hepatitis B.  You get hemodialysis treatment.  You take certain medicines for conditions like cancer, organ transplantation, and autoimmune conditions.  Hepatitis C blood testing is recommended for all people born from [birth year] through 1963 and any individual with known risks for hepatitis C.  Practice safe sex. Use condoms and avoid high-risk sexual practices to reduce the spread of sexually transmitted infections (STIs). STIs include gonorrhea, chlamydia, syphilis, trichomonas, herpes, HPV, and human immunodeficiency virus (HIV). Herpes, HIV, and HPV are viral illnesses that have no cure. They can result in disability, cancer, and death.  You should be screened for sexually transmitted illnesses (STIs) including gonorrhea and chlamydia if:  You are sexually active and are  younger than 24 years.  You are older than 24 years and your health care provider tells you that you are at risk for this type of infection.  Your sexual activity has changed since you were last screened and you are at an increased risk for chlamydia or gonorrhea. Ask your health care provider if you are at risk.  If you are at risk of being infected with HIV, it is recommended that you take a prescription medicine daily to prevent HIV infection. This is called preexposure prophylaxis (PrEP). You are considered at risk if:  You are a heterosexual woman, are sexually active, and are at increased  risk for HIV infection.  You take drugs by injection.  You are sexually active with a partner who has HIV.  Talk with your health care provider about whether you are at high risk of being infected with HIV. If you choose to begin PrEP, you should first be tested for HIV. You should then be tested every 3 months for as long as you are taking PrEP.  Osteoporosis is a disease in which the bones lose minerals and strength with aging. This can result in serious bone fractures or breaks. The risk of osteoporosis can be identified using a bone density scan. Women ages 91 years and over and women at risk for fractures or osteoporosis should discuss screening with their health care providers. Ask your health care provider whether you should take a calcium supplement or vitamin D to reduce the rate of osteoporosis.  Menopause can be associated with physical symptoms and risks. Hormone replacement therapy is available to decrease symptoms and risks. You should talk to your health care provider about whether hormone replacement therapy is right for you.  Use sunscreen. Apply sunscreen liberally and repeatedly throughout the day. You should seek shade when your shadow is shorter than you. Protect yourself by wearing long sleeves, pants, a wide-brimmed hat, and sunglasses year round, whenever you are outdoors.  Once a  month, do a whole body skin exam, using a mirror to look at the skin on your back. Tell your health care provider of new moles, moles that have irregular borders, moles that are larger than a pencil eraser, or moles that have changed in shape or color.  Stay current with required vaccines (immunizations).  Influenza vaccine. All adults should be immunized every year.  Tetanus, diphtheria, and acellular pertussis (Td, Tdap) vaccine. Pregnant women should receive 1 dose of Tdap vaccine during each pregnancy. The dose should be obtained regardless of the length of time since the last dose. Immunization is preferred during the 27th-36th week of gestation. An adult who has not previously received Tdap or who does not know her vaccine status should receive 1 dose of Tdap. This initial dose should be followed by tetanus and diphtheria toxoids (Td) booster doses every 10 years. Adults with an unknown or incomplete history of completing a 3-dose immunization series with Td-containing vaccines should begin or complete a primary immunization series including a Tdap dose. Adults should receive a Td booster every 10 years.  Varicella vaccine. An adult without evidence of immunity to varicella should receive 2 doses or a second dose if she has previously received 1 dose. Pregnant females who do not have evidence of immunity should receive the first dose after pregnancy. This first dose should be obtained before leaving the health care facility. The second dose should be obtained 4-8 weeks after the first dose.  Human papillomavirus (HPV) vaccine. Females aged 13-26 years who have not received the vaccine previously should obtain the 3-dose series. The vaccine is not recommended for use in pregnant females. However, pregnancy testing is not needed before receiving a dose. If a female is found to be pregnant after receiving a dose, no treatment is needed. In that case, the remaining doses should be delayed until after the  pregnancy. Immunization is recommended for any person with an immunocompromised condition through the age of 65 years if she did not get any or all doses earlier. During the 3-dose series, the second dose should be obtained 4-8 weeks after the first dose. The third dose should be obtained  24 weeks after the first dose and 16 weeks after the second dose.  Zoster vaccine. One dose is recommended for adults aged 62 years or older unless certain conditions are present.  Measles, mumps, and rubella (MMR) vaccine. Adults born before [birth year] generally are considered immune to measles and mumps. Adults born in [birth year] or later should have 1 or more doses of MMR vaccine unless there is a contraindication to the vaccine or there is laboratory evidence of immunity to each of the three diseases. A routine second dose of MMR vaccine should be obtained at least 28 days after the first dose for students attending postsecondary schools, health care workers, or international travelers. People who received inactivated measles vaccine or an unknown type of measles vaccine during 1963-1967 should receive 2 doses of MMR vaccine. People who received inactivated mumps vaccine or an unknown type of mumps vaccine before 1979 and are at high risk for mumps infection should consider immunization with 2 doses of MMR vaccine. For females of childbearing age, rubella immunity should be determined. If there is no evidence of immunity, females who are not pregnant should be vaccinated. If there is no evidence of immunity, females who are pregnant should delay immunization until after pregnancy. Unvaccinated health care workers born before [birth year] who lack laboratory evidence of measles, mumps, or rubella immunity or laboratory confirmation of disease should consider measles and mumps immunization with 2 doses of MMR vaccine or rubella immunization with 1 dose of MMR vaccine.  Pneumococcal 13-valent conjugate (PCV13) vaccine. When indicated, a person  who is uncertain of her immunization history and has no record of immunization should receive the PCV13 vaccine. An adult aged 85 years or older who has certain medical conditions and has not been previously immunized should receive 1 dose of PCV13 vaccine. This PCV13 should be followed with a dose of pneumococcal polysaccharide (PPSV23) vaccine. The PPSV23 vaccine dose should be obtained at least 8 weeks after the dose of PCV13 vaccine. An adult aged 38 years or older who has certain medical conditions and previously received 1 or more doses of PPSV23 vaccine should receive 1 dose of PCV13. The PCV13 vaccine dose should be obtained 1 or more years after the last PPSV23 vaccine dose.  Pneumococcal polysaccharide (PPSV23) vaccine. When PCV13 is also indicated, PCV13 should be obtained first. All adults aged 108 years and older should be immunized. An adult younger than age 52 years who has certain medical conditions should be immunized. Any person who resides in a nursing home or long-term care facility should be immunized. An adult smoker should be immunized. People with an immunocompromised condition and certain other conditions should receive both PCV13 and PPSV23 vaccines. People with human immunodeficiency virus (HIV) infection should be immunized as soon as possible after diagnosis. Immunization during chemotherapy or radiation therapy should be avoided. Routine use of PPSV23 vaccine is not recommended for American Indians, [address], or people younger than 65 years unless there are medical conditions that require PPSV23 vaccine. When indicated, people who have unknown immunization and have no record of immunization should receive PPSV23 vaccine. One-time revaccination 5 years after the first dose of PPSV23 is recommended for people aged 19-64 years who have chronic kidney failure, nephrotic syndrome, asplenia, or immunocompromised conditions. People who received 1-2 doses of PPSV23 before age 73 years  should receive another dose of PPSV23 vaccine at age 6 years or later if at least 5 years have passed since the previous dose. Doses of PPSV23 are not  needed for people immunized with PPSV23 at or after age 88 years.  Meningococcal vaccine. Adults with asplenia or persistent complement component deficiencies should receive 2 doses of quadrivalent meningococcal conjugate (MenACWY-D) vaccine. The doses should be obtained at least 2 months apart. Microbiologists working with certain meningococcal bacteria, [employer], people at risk during an outbreak, and people who travel to or live in countries with a high rate of meningitis should be immunized. A first-year college student up through age 69 years who is living in a residence hall should receive a dose if she did not receive a dose on or after her 16th birthday. Adults who have certain high-risk conditions should receive one or more doses of vaccine.  Hepatitis A vaccine. Adults who wish to be protected from this disease, have certain high-risk conditions, work with hepatitis A-infected animals, work in hepatitis A research labs, or travel to or work in countries with a high rate of hepatitis A should be immunized. Adults who were previously unvaccinated and who anticipate close contact with an international adoptee during the first 60 days after arrival in the [place] from a country with a high rate of hepatitis A should be immunized.  Hepatitis B vaccine. Adults who wish to be protected from this disease, have certain high-risk conditions, may be exposed to blood or other infectious body fluids, are household contacts or sex partners of hepatitis B positive people, are clients or workers in certain care facilities, or travel to or work in countries with a high rate of hepatitis B should be immunized.  Haemophilus influenzae type b (Hib) vaccine. A previously unvaccinated person with asplenia or sickle cell disease or having a scheduled  splenectomy should receive 1 dose of Hib vaccine. Regardless of previous immunization, a recipient of a hematopoietic stem cell transplant should receive a 3-dose series 6-12 months after her successful transplant. Hib vaccine is not recommended for adults with HIV infection. Preventive Services / Frequency Ages 51 to 28 years  Blood pressure check.** / Every 1 to 2 years.  Lipid and cholesterol check.** / Every 5 years beginning at age 22.  Clinical breast exam.** / Every 3 years for women in their 32s and 28s.  BRCA-related cancer risk assessment.** / For women who have family members with a BRCA-related cancer (breast, ovarian, tubal, or peritoneal cancers).  Pap test.** / Every 2 years from ages 108 through 5. Every 3 years starting at age 47 through age 35 or 48 with a history of 3 consecutive normal Pap tests.  HPV screening.** / Every 3 years from ages 60 through ages 88 to 41 with a history of 3 consecutive normal Pap tests.  Hepatitis C blood test.** / For any individual with known risks for hepatitis C.  Skin self-exam. / Monthly.  Influenza vaccine. / Every year.  Tetanus, diphtheria, and acellular pertussis (Tdap, Td) vaccine.** / Consult your health care provider. Pregnant women should receive 1 dose of Tdap vaccine during each pregnancy. 1 dose of Td every 10 years.  Varicella vaccine.** / Consult your health care provider. Pregnant females who do not have evidence of immunity should receive the first dose after pregnancy.  HPV vaccine. / 3 doses over 6 months, if 81 and younger. The vaccine is not recommended for use in pregnant females. However, pregnancy testing is not needed before receiving a dose.  Measles, mumps, rubella (MMR) vaccine.** / You need at least 1 dose of MMR if you were born in 1957 or later. You may  also need a 2nd dose. For females of childbearing age, rubella immunity should be determined. If there is no evidence of immunity, females who are not  pregnant should be vaccinated. If there is no evidence of immunity, females who are pregnant should delay immunization until after pregnancy.  Pneumococcal 13-valent conjugate (PCV13) vaccine.** / Consult your health care provider.  Pneumococcal polysaccharide (PPSV23) vaccine.** / 1 to 2 doses if you smoke cigarettes or if you have certain conditions.  Meningococcal vaccine.** / 1 dose if you are age 58 to 82 years and a Market researcher living in a residence hall, or have one of several medical conditions, you need to get vaccinated against meningococcal disease. You may also need additional booster doses.  Hepatitis A vaccine.** / Consult your health care provider.  Hepatitis B vaccine.** / Consult your health care provider.  Haemophilus influenzae type b (Hib) vaccine.** / Consult your health care provider. Ages 49 to 35 years  Blood pressure check.** / Every 1 to 2 years.  Lipid and cholesterol check.** / Every 5 years beginning at age 92 years.  Lung cancer screening. / Every year if you are aged 81-80 years and have a 30-pack-year history of smoking and currently smoke or have quit within the past 15 years. Yearly screening is stopped once you have quit smoking for at least 15 years or develop a health problem that would prevent you from having lung cancer treatment.  Clinical breast exam.** / Every year after age 14 years.  BRCA-related cancer risk assessment.** / For women who have family members with a BRCA-related cancer (breast, ovarian, tubal, or peritoneal cancers).  Mammogram.** / Every year beginning at age 37 years and continuing for as long as you are in good health. Consult with your health care provider.  Pap test.** / Every 3 years starting at age 58 years through age 33 or 70 years with a history of 3 consecutive normal Pap tests.  HPV screening.** / Every 3 years from ages 39 years through ages 28 to 45 years with a history of 3 consecutive normal Pap  tests.  Fecal occult blood test (FOBT) of stool. / Every year beginning at age 37 years and continuing until age 41 years. You may not need to do this test if you get a colonoscopy every 10 years.  Flexible sigmoidoscopy or colonoscopy.** / Every 5 years for a flexible sigmoidoscopy or every 10 years for a colonoscopy beginning at age 65 years and continuing until age 79 years.  Hepatitis C blood test.** / For all people born from [birth year] through 1963 and any individual with known risks for hepatitis C.  Skin self-exam. / Monthly.  Influenza vaccine. / Every year.  Tetanus, diphtheria, and acellular pertussis (Tdap/Td) vaccine.** / Consult your health care provider. Pregnant women should receive 1 dose of Tdap vaccine during each pregnancy. 1 dose of Td every 10 years.  Varicella vaccine.** / Consult your health care provider. Pregnant females who do not have evidence of immunity should receive the first dose after pregnancy.  Zoster vaccine.** / 1 dose for adults aged 7 years or older.  Measles, mumps, rubella (MMR) vaccine.** / You need at least 1 dose of MMR if you were born in 1957 or later. You may also need a 2nd dose. For females of childbearing age, rubella immunity should be determined. If there is no evidence of immunity, females who are not pregnant should be vaccinated. If there is no evidence of immunity, females who are  pregnant should delay immunization until after pregnancy.  Pneumococcal 13-valent conjugate (PCV13) vaccine.** / Consult your health care provider.  Pneumococcal polysaccharide (PPSV23) vaccine.** / 1 to 2 doses if you smoke cigarettes or if you have certain conditions.  Meningococcal vaccine.** / Consult your health care provider.  Hepatitis A vaccine.** / Consult your health care provider.  Hepatitis B vaccine.** / Consult your health care provider.  Haemophilus influenzae type b (Hib) vaccine.** / Consult your health care provider. Ages 65 years and  over  Blood pressure check.** / Every 1 to 2 years.  Lipid and cholesterol check.** / Every 5 years beginning at age 18 years.  Lung cancer screening. / Every year if you are aged 55-80 years and have a 30-pack-year history of smoking and currently smoke or have quit within the past 15 years. Yearly screening is stopped once you have quit smoking for at least 15 years or develop a health problem that would prevent you from having lung cancer treatment.  Clinical breast exam.** / Every year after age 103 years.  BRCA-related cancer risk assessment.** / For women who have family members with a BRCA-related cancer (breast, ovarian, tubal, or peritoneal cancers).  Mammogram.** / Every year beginning at age 26 years and continuing for as long as you are in good health. Consult with your health care provider.  Pap test.** / Every 3 years starting at age 29 years through age 47 or 94 years with 3 consecutive normal Pap tests. Testing can be stopped between 65 and 70 years with 3 consecutive normal Pap tests and no abnormal Pap or HPV tests in the past 10 years.  HPV screening.** / Every 3 years from ages 56 years through ages 64 or 4 years with a history of 3 consecutive normal Pap tests. Testing can be stopped between 65 and 70 years with 3 consecutive normal Pap tests and no abnormal Pap or HPV tests in the past 10 years.  Fecal occult blood test (FOBT) of stool. / Every year beginning at age 33 years and continuing until age 70 years. You may not need to do this test if you get a colonoscopy every 10 years.  Flexible sigmoidoscopy or colonoscopy.** / Every 5 years for a flexible sigmoidoscopy or every 10 years for a colonoscopy beginning at age 31 years and continuing until age 72 years.  Hepatitis C blood test.** / For all people born from [birth year] through 1963 and any individual with known risks for hepatitis C.  Osteoporosis screening.** / A one-time screening for women ages 41 years and over and  women at risk for fractures or osteoporosis.  Skin self-exam. / Monthly.  Influenza vaccine. / Every year.  Tetanus, diphtheria, and acellular pertussis (Tdap/Td) vaccine.** / 1 dose of Td every 10 years.  Varicella vaccine.** / Consult your health care provider.  Zoster vaccine.** / 1 dose for adults aged 32 years or older.  Pneumococcal 13-valent conjugate (PCV13) vaccine.** / Consult your health care provider.  Pneumococcal polysaccharide (PPSV23) vaccine.** / 1 dose for all adults aged 39 years and older.  Meningococcal vaccine.** / Consult your health care provider.  Hepatitis A vaccine.** / Consult your health care provider.  Hepatitis B vaccine.** / Consult your health care provider.  Haemophilus influenzae type b (Hib) vaccine.** / Consult your health care provider. ** Family history and personal history of risk and conditions may change your health care provider's recommendations. Document Released: 10/31/2001 Document Revised: 01/19/2014 Document Reviewed: 01/30/2011 [facility] Patient Information 2015 [place], [place].  This information is not intended to replace advice given to you by your health care provider. Make sure you discuss any questions you have with your health care provider.

## 2014-07-01 NOTE — Telephone Encounter (Signed)
Empiric Ciprofloxacin 500 mg BID x 3 days called in.  Patient to take as directed. Increase fluids. Will call when culture has resulted.

## 2014-07-01 NOTE — Progress Notes (Signed)
Patient presents to clinic today for annual exam.  Patient is fasting for labs.  Chronic Issues: Morbid Obesity -- Failed Belviq and Orlistat.  BP good.  Is diet and exercising.  Would like to talk about other options.  Tobacco Abuse disorder -- still smoking but has decreased to 3 cigarettes from 1 ppd. Wishes to restart Chantix   GERD -- well controlled with current regimen.  Denies nausea, vomiting or abdominal pain.  Health Maintenance: Dental -- up-to-date Vision -- up-to-date Immunizations -- Flu shot given at today's visit. Due for TDaP vaccination.  Will come back for this. Mammogram --  PAP -- Last in 2014.  Normal.  Followed by OB/GYN.  Past Medical History  Diagnosis Date  . Chicken pox   . GERD (gastroesophageal reflux disease)   . Elevated blood pressure   . Drug abuse in remission   . UTI (lower urinary tract infection)   . Hypolipidemia   . STD (sexually transmitted disease)     gc over 71yrs ago    Past Surgical History  Procedure Laterality Date  . Wisdom tooth extraction    . Foot surgery      Right    Current Outpatient Prescriptions on File Prior to Visit  Medication Sig Dispense Refill  . omeprazole (PRILOSEC) 40 MG capsule Take 1 capsule (40 mg total) by mouth daily.  30 capsule  1   No current facility-administered medications on file prior to visit.    No Known Allergies  Family History  Problem Relation Age of Onset  . Alcoholism Father     Deceased  . Drug abuse Father   . Prostate cancer Father   . Hypertension Mother     Living  . Diabetes Mother   . Hyperlipidemia Mother   . Heart murmur Mother   . Alcoholism Maternal Grandfather   . Dementia Maternal Grandfather   . Arthritis Maternal Grandmother   . Breast cancer Maternal Grandmother   . Stroke Maternal Grandmother   . Hypertension Maternal Grandmother   . Stroke Maternal Aunt   . Colon cancer Maternal Aunt   . Hyperlipidemia Maternal Aunt   . Alcoholism Maternal  Uncle     x2  . Alcoholism Maternal Aunt   . Aneurysm Maternal Aunt   . Hypertension Maternal Aunt     x2  . Stroke Brother   . Drug abuse Brother   . Heart disease Brother   . Bone cancer Paternal Aunt     History   Social History  . Marital Status: Single    Spouse Name: N/A    Number of Children: N/A  . Years of Education: N/A   Occupational History  . Not on file.   Social History Main Topics  . Smoking status: Current Every Day Smoker -- 1.00 packs/day    Types: Cigarettes  . Smokeless tobacco: Never Used     Comment: D/C Chantix.  Marland Kitchen Alcohol Use: No  . Drug Use: No  . Sexual Activity: Yes    Partners: Male    Birth Control/ Protection: None   Other Topics Concern  . Not on file   Social History Narrative  . No narrative on file   Review of Systems  Constitutional: Negative for fever and weight loss.  HENT: Negative for ear pain, hearing loss and tinnitus.   Eyes: Negative for blurred vision, double vision, photophobia and pain.  Respiratory: Negative for cough and shortness of breath.   Cardiovascular: Negative for chest  pain and palpitations.  Gastrointestinal: Negative for heartburn, nausea, vomiting, abdominal pain, diarrhea, constipation, blood in stool and melena.  Genitourinary: Negative for dysuria, urgency, frequency, hematuria and flank pain.  Neurological: Negative for headaches.  Psychiatric/Behavioral: Negative for depression, suicidal ideas, hallucinations and substance abuse. The patient is not nervous/anxious and does not have insomnia.    BP 118/83  Pulse 71  Temp(Src) 98.3 F (36.8 C) (Oral)  Resp 16  Ht 5' 2.25" (1.581 m)  Wt 227 lb 6 oz (103.137 kg)  BMI 41.26 kg/m2  SpO2 100%  LMP 06/18/2014  Physical Exam  Vitals reviewed. Constitutional: She is oriented to person, place, and time and well-developed, well-nourished, and in no distress.  HENT:  Head: Normocephalic and atraumatic.  Right Ear: External ear normal.  Left Ear:  External ear normal.  Nose: Nose normal.  Mouth/Throat: No oropharyngeal exudate.  TM within normal limits bilaterally.  Eyes: Conjunctivae are normal. Pupils are equal, round, and reactive to light.  Neck: Neck supple. No thyromegaly present.  Cardiovascular: Normal rate, regular rhythm, normal heart sounds and intact distal pulses.   Pulmonary/Chest: Effort normal and breath sounds normal.  Abdominal: Soft. Bowel sounds are normal. She exhibits no distension and no mass. There is no tenderness. There is no rebound and no guarding.  Lymphadenopathy:    She has no cervical adenopathy.  Neurological: She is alert and oriented to person, place, and time.  Skin: Skin is warm and dry. No rash noted.  Psychiatric: Affect normal.   Assessment/Plan: Visit for preventive health examination Medical history reviewed and updated.  Flu shot given. Patient to return to clinic in 1 month to update TDaP. Followed by OB/GYN for PAP and Mammography.  Will obtain fasting labs at today's visit.  Smoking trying to quit Will restart Chantix now that patient feels truly ready to quit.  Morbid obesity BP looks great.  Rx Phentermine 30 mg QD x 1 month.  Follow-up in 1 month for reassessment.

## 2014-07-01 NOTE — Addendum Note (Signed)
Addended by: Modena Morrow D on: 07/01/2014 04:54 PM   Modules accepted: Orders

## 2014-07-01 NOTE — Progress Notes (Signed)
Pre visit review using our clinic review tool, if applicable. No additional management support is needed unless otherwise documented below in the visit note/SLS  

## 2014-07-02 NOTE — Telephone Encounter (Signed)
Pt notified. Pt stated that she picked up the antibiotics.

## 2014-07-03 ENCOUNTER — Encounter: Payer: Self-pay | Admitting: Certified Nurse Midwife

## 2014-07-03 DIAGNOSIS — Z23 Encounter for immunization: Secondary | ICD-10-CM

## 2014-07-04 LAB — VITAMIN D 1,25 DIHYDROXY
Vitamin D 1, 25 (OH)2 Total: 51 pg/mL (ref 18–72)
Vitamin D2 1, 25 (OH)2: <8 pg/mL
Vitamin D3 1, 25 (OH)2: 51 pg/mL

## 2014-07-05 LAB — URINE CULTURE: Colony Count: >=100000

## 2014-07-13 ENCOUNTER — Telehealth: Payer: Self-pay | Admitting: *Deleted

## 2014-07-13 NOTE — Telephone Encounter (Signed)
[facility] sent fax stating that "Chantix starter pack is not available; would you change to something else, please and thank you"/SLS Please Advise.

## 2014-07-13 NOTE — Telephone Encounter (Signed)
Should be available at another pharmacy. Is she willing to pick another place to send Rx to?

## 2014-07-14 NOTE — Telephone Encounter (Signed)
LMOM with contact name and number RE: Chantix and further provider instructions; informed to [facility] another [facility] for RX transfer and/or if needed to go to new pharmacy, call back for new Rx/SLS

## 2014-07-20 ENCOUNTER — Encounter: Payer: Self-pay | Admitting: Physician Assistant

## 2014-08-12 ENCOUNTER — Ambulatory Visit: Payer: PRIVATE HEALTH INSURANCE | Admitting: Physician Assistant

## 2014-08-12 ENCOUNTER — Encounter: Payer: Self-pay | Admitting: Obstetrics & Gynecology

## 2014-08-17 ENCOUNTER — Ambulatory Visit (INDEPENDENT_AMBULATORY_CARE_PROVIDER_SITE_OTHER): Payer: PRIVATE HEALTH INSURANCE | Admitting: Physician Assistant

## 2014-08-17 ENCOUNTER — Encounter: Payer: Self-pay | Admitting: Physician Assistant

## 2014-08-17 VITALS — BP 130/76 | HR 62 | Temp 97.8°F | Resp 16 | Ht 62.25 in | Wt 229.0 lb

## 2014-08-17 DIAGNOSIS — K2100 Gastro-esophageal reflux disease with esophagitis, without bleeding: Secondary | ICD-10-CM

## 2014-08-17 DIAGNOSIS — Z720 Tobacco use: Secondary | ICD-10-CM

## 2014-08-17 MED ORDER — OMEPRAZOLE 40 MG PO CPDR: 40.0000 mg | DELAYED_RELEASE_CAPSULE | Freq: Every day | ORAL | Status: DC

## 2014-08-17 MED ORDER — VARENICLINE TARTRATE 0.5 MG X 11 & 1 MG X 42 PO MISC: ORAL | Status: DC

## 2014-08-17 NOTE — Progress Notes (Signed)
Patient presents to clinic today for 6-week follow-up of obesity and tobacco abuse disorder.  Patient placed on Phentermine 3m at last visit.  Patient has not started taking as directed.  Endorses watching diet and exercising daily.  Will be starting the Phentermine today.  Could not pick up the Chantix as her pharmacy did not carry the starter pack.  Would like sent to another pharmacy.  Past Medical History  Diagnosis Date  . Chicken pox   . GERD (gastroesophageal reflux disease)   . Elevated blood pressure   . Drug abuse in remission   . UTI (lower urinary tract infection)   . Hypolipidemia   . STD (sexually transmitted disease)     gc over 89yrago    Current Outpatient Prescriptions on File Prior to Visit  Medication Sig Dispense Refill  . phentermine 30 MG capsule Take 1 capsule (30 mg total) by mouth every morning. 30 capsule 1   No current facility-administered medications on file prior to visit.    No Known Allergies  Family History  Problem Relation Age of Onset  . Alcoholism Father     Deceased  . Drug abuse Father   . Prostate cancer Father   . Hypertension Mother     Living  . Diabetes Mother   . Hyperlipidemia Mother   . Heart murmur Mother   . Alcoholism Maternal Grandfather   . Dementia Maternal Grandfather   . Arthritis Maternal Grandmother   . Breast cancer Maternal Grandmother   . Stroke Maternal Grandmother   . Hypertension Maternal Grandmother   . Stroke Maternal Aunt   . Colon cancer Maternal Aunt   . Hyperlipidemia Maternal Aunt   . Alcoholism Maternal Uncle     x2  . Alcoholism Maternal Aunt   . Aneurysm Maternal Aunt   . Hypertension Maternal Aunt     x2  . Stroke Brother   . Drug abuse Brother   . Heart disease Brother   . Bone cancer Paternal Aunt     History   Social History  . Marital Status: Single    Spouse Name: N/A    Number of Children: N/A  . Years of Education: N/A   Social History Main Topics  . Smoking  status: Current Every Day Smoker -- 1.00 packs/day    Types: Cigarettes  . Smokeless tobacco: Never Used     Comment: D/C Chantix.  . Marland Kitchenlcohol Use: No  . Drug Use: No  . Sexual Activity:    Partners: Male    Birth Control/ Protection: None   Other Topics Concern  . None   Social History Narrative   Review of Systems - See HPI.  All other ROS are negative.  BP 130/76 mmHg  Pulse 62  Temp(Src) 97.8 F (36.6 C) (Oral)  Resp 16  Ht 5' 2.25" (1.581 m)  Wt 229 lb (103.874 kg)  BMI 41.56 kg/m2  SpO2 100%  LMP 08/10/2014  Physical Exam  Constitutional: She is oriented to person, place, and time and well-developed, well-nourished, and in no distress.  HENT:  Head: Normocephalic.  Cardiovascular: Normal rate, regular rhythm, normal heart sounds and intact distal pulses.   Neurological: She is alert and oriented to person, place, and time.  Vitals reviewed.   Recent Results (from the past 2160 hour(s))  BASIC METABOLIC PANEL WITH GFR     Status: None   Collection Time: 07/01/14 10:05 AM  Result Value Ref Range   Sodium 138 135 -  145 mEq/L   Potassium 4.3 3.5 - 5.3 mEq/L   Chloride 106 96 - 112 mEq/L   CO2 26 19 - 32 mEq/L   Glucose, Bld 80 70 - 99 mg/dL   BUN 10 6 - 23 mg/dL   Creat 0.77 0.50 - 1.10 mg/dL   Calcium 9.1 8.4 - 10.5 mg/dL   GFR, Est African American >89 mL/min   GFR, Est Non African American >89 mL/min    Comment:   The estimated GFR is a calculation valid for adults (>=88 years old) that uses the CKD-EPI algorithm to adjust for age and sex. It is   not to be used for children, pregnant women, hospitalized patients,    patients on dialysis, or with rapidly changing kidney function. According to the NKDEP, eGFR >89 is normal, 60-89 shows mild impairment, 30-59 shows moderate impairment, 15-29 shows severe impairment and <15 is ESRD.    CBC     Status: Abnormal   Collection Time: 07/01/14 10:05 AM  Result Value Ref Range   WBC 8.4 4.0 - 10.5 K/uL   RBC  5.20 (H) 3.87 - 5.11 Mil/uL   Platelets 208.0 150.0 - 400.0 K/uL   Hemoglobin 14.4 12.0 - 15.0 g/dL   HCT 45.1 36.0 - 46.0 %   MCV 86.8 78.0 - 100.0 fl   MCHC 32.0 30.0 - 36.0 g/dL   RDW 13.6 11.5 - 15.5 %  Hemoglobin A1c     Status: None   Collection Time: 07/01/14 10:05 AM  Result Value Ref Range   Hgb A1c MFr Bld 5.7 4.6 - 6.5 %    Comment: Glycemic Control Guidelines for People with Diabetes:Non Diabetic:  <6%Goal of Therapy: <7%Additional Action Suggested:  >8%   Hepatic function panel     Status: Abnormal   Collection Time: 07/01/14 10:05 AM  Result Value Ref Range   Total Bilirubin 0.4 0.2 - 1.2 mg/dL   Bilirubin, Direct 0.0 0.0 - 0.3 mg/dL   Alkaline Phosphatase 102 39 - 117 U/L   AST 19 0 - 37 U/L   ALT 20 0 - 35 U/L   Total Protein 7.4 6.0 - 8.3 g/dL   Albumin 3.2 (L) 3.5 - 5.2 g/dL  Lipid Panel w/Direct LDL:HDL Ratio     Status: Abnormal   Collection Time: 07/01/14 10:05 AM  Result Value Ref Range   Cholesterol 173 0 - 200 mg/dL   Triglycerides 101 <150 mg/dL   HDL 43 >39 mg/dL   Total Chol/HDL Ratio 4.0 Ratio   Direct LDL 117 (H) mg/dL    Comment: ATP III Classification (LDL):       < 100        mg/dL         Optimal      100 - 129     mg/dL         Near or Above Optimal      130 - 159     mg/dL         Borderline High      160 - 189     mg/dL         High       > 190        mg/dL         Very High     LDL:HDL Ratio 2.7 Ratio    Comment:  LDL/HDL Ratio Table                                        Men       Women          1/2 Average Risk              1.0        1.5              Average Risk              3.6        3.2          2 X Average Risk              6.3        5.0          3 X Average Risk              8.0        6.1   [Kannel WB, Castelli WP, and [facility]arcus [facility] Int Med, 1979, 90:85]    TSH     Status: None   Collection Time: 07/01/14 10:05 AM  Result Value Ref Range   TSH 1.42 0.35 - 4.50 uIU/mL  Urinalysis, Routine w reflex  microscopic     Status: Abnormal   Collection Time: 07/01/14 10:05 AM  Result Value Ref Range   Color, Urine YELLOW Yellow;Lt. Yellow   APPearance CLEAR Clear   Specific Gravity, Urine 1.025 1.000-1.030   pH 6.5 5.0 - 8.0   Total Protein, Urine NEGATIVE Negative   Urine Glucose NEGATIVE Negative   Ketones, ur NEGATIVE Negative   Bilirubin Urine NEGATIVE Negative   Hgb urine dipstick NEGATIVE Negative   Urobilinogen, UA 0.2 0.0 - 1.0   Leukocytes, UA NEGATIVE Negative   Nitrite POSITIVE (A) Negative   WBC, UA 0-2/hpf 0-2/hpf   RBC / HPF none seen 0-2/hpf   Mucus, UA Presence of (A) None   Squamous Epithelial / LPF Few(5-10/hpf) (A) Rare(0-4/hpf)   Bacteria, UA Many(>50/hpf) (A) None  Vitamin D 1,25 dihydroxy     Status: None   Collection Time: 07/01/14 10:05 AM  Result Value Ref Range   Vitamin D 1, 25 (OH)2 Total 51 18 - 72 pg/mL   Vitamin D3 1, 25 (OH)2 51 pg/mL   Vitamin D2 1, 25 (OH)2 <8 pg/mL    Comment: Vitamin D3, 1,25(OH)2 indicates both endogenous production and supplementation.  Vitamin D2, 1,25(OH)2 is an indicator of exogeous sources, such as diet or supplementation.  Interpretation and therapy are based on measurement of Vitamin D,1,25(OH)2, Total. This test was developed and its analytical performance characteristics have been determined by [facility], [place], [place]. It has not been cleared or approved by the FDA. This assay has been validated pursuant to the CLIA regulations and is used for clinical purposes.  Urine Culture     Status: None   Collection Time: 07/01/14  4:55 PM  Result Value Ref Range   Culture ESCHERICHIA COLI    Colony Count >=100,000 COLONIES/ML    Organism ID, Bacteria ESCHERICHIA COLI       Susceptibility   Escherichia coli -  (no method available)    AMPICILLIN <=2 Sensitive     AMOX/CLAVULANIC <=2 Sensitive     AMPICILLIN/SULBACTAM <=2 Sensitive     PIP/TAZO <=4 Sensitive  IMIPENEM <=0.25 Sensitive      CEFAZOLIN <=4 Sensitive     CEFTRIAXONE <=1 Sensitive     CEFTAZIDIME <=1 Sensitive     CEFEPIME <=1 Sensitive     GENTAMICIN <=1 Sensitive     TOBRAMYCIN <=1 Sensitive     CIPROFLOXACIN <=0.25 Sensitive     LEVOFLOXACIN <=0.12 Sensitive     NITROFURANTOIN <=16 Sensitive     TRIMETH/SULFA <=20 Sensitive     Assessment/Plan: Smoking trying to quit Chantix sent to [place].  Instructions reiterated to patient.  Follow-up in 1-2 months.  Morbid obesity Has not started medication due to work schedule.  Will start today.  ADRs again discussed with patient.  Continue TLCs.  Follow-up in 1-2 months.

## 2014-08-17 NOTE — Assessment & Plan Note (Signed)
Has not started medication due to work schedule.  Will start today.  ADRs again discussed with patient.  Continue TLCs.  Follow-up in 1-2 months.

## 2014-08-17 NOTE — Assessment & Plan Note (Signed)
Chantix sent to [place].  Instructions reiterated to patient.  Follow-up in 1-2 months.

## 2014-08-17 NOTE — Progress Notes (Signed)
Pre visit review using our clinic review tool, if applicable. No additional management support is needed unless otherwise documented below in the visit note/SLS  

## 2014-08-17 NOTE — Patient Instructions (Signed)
Please start the Chantix as directed for smoking cessation. 1-2 weeks later, begin the Phentermine. Take it as directed. Continue with diet and exercise. Follow-up with me in 4-6 weeks. If you notice difficulty sleeping or racing heart with Phentermine, please stop medication and call the office.

## 2014-09-28 ENCOUNTER — Ambulatory Visit: Payer: PRIVATE HEALTH INSURANCE | Admitting: Physician Assistant

## 2014-11-04 ENCOUNTER — Ambulatory Visit (INDEPENDENT_AMBULATORY_CARE_PROVIDER_SITE_OTHER): Payer: PRIVATE HEALTH INSURANCE | Admitting: Physician Assistant

## 2014-11-04 ENCOUNTER — Encounter: Payer: Self-pay | Admitting: Physician Assistant

## 2014-11-04 DIAGNOSIS — S8011XA Contusion of right lower leg, initial encounter: Secondary | ICD-10-CM

## 2014-11-04 DIAGNOSIS — Z720 Tobacco use: Secondary | ICD-10-CM

## 2014-11-04 DIAGNOSIS — S8010XA Contusion of unspecified lower leg, initial encounter: Secondary | ICD-10-CM | POA: Insufficient documentation

## 2014-11-04 MED ORDER — PHENTERMINE HCL 15 MG PO CAPS: 15.0000 mg | ORAL_CAPSULE | ORAL | Status: DC

## 2014-11-04 NOTE — Patient Instructions (Signed)
Please start the new, lower dose of Phentermine.  Let me know if any jitteriness occurs on this dose.  Continue diet and exercise regimen.  If all is good, continue mediation and follow-up with me in 1 month.  Please start the Chantix to aid in your smoking cessation, following the instructions on your packaging.  For the bruising, this is a benign "ruputred" hematoma.  The bruising will continue to subside.  Keep ice on the area and use Tylenol for any soreness or pain.  Hematoma A hematoma is a collection of blood under the skin, in an organ, in a body space, in a joint space, or in other tissue. The blood can clot to form a lump that you can see and feel. The lump is often firm and may sometimes become sore and tender. Most hematomas get better in a few days to weeks. However, some hematomas may be serious and require medical care. Hematomas can range in size from very small to very large. CAUSES  A hematoma can be caused by a blunt or penetrating injury. It can also be caused by spontaneous leakage from a blood vessel under the skin. Spontaneous leakage from a blood vessel is more likely to occur in older people, especially those taking blood thinners. Sometimes, a hematoma can develop after certain medical procedures. SIGNS AND SYMPTOMS   A firm lump on the body.  Possible pain and tenderness in the area.  Bruising.Blue, dark blue, purple-red, or yellowish skin may appear at the site of the hematoma if the hematoma is close to the surface of the skin. For hematomas in deeper tissues or body spaces, the signs and symptoms may be subtle. For example, an intra-abdominal hematoma may cause abdominal pain, weakness, fainting, and shortness of breath. An intracranial hematoma may cause a headache or symptoms such as weakness, trouble speaking, or a change in consciousness. DIAGNOSIS  A hematoma can usually be diagnosed based on your medical history and a physical exam. Imaging tests may be needed  if your health care provider suspects a hematoma in deeper tissues or body spaces, such as the abdomen, head, or chest. These tests may include ultrasonography or a CT scan.  TREATMENT  Hematomas usually go away on their own over time. Rarely does the blood need to be drained out of the body. Large hematomas or those that may affect vital organs will sometimes need surgical drainage or monitoring. HOME CARE INSTRUCTIONS   Apply ice to the injured area:   Put ice in a plastic bag.   Place a towel between your skin and the bag.   Leave the ice on for 20 minutes, 2-3 times a day for the first 1 to 2 days.   After the first 2 days, switch to using warm compresses on the hematoma.   Elevate the injured area to help decrease pain and swelling. Wrapping the area with an elastic bandage may also be helpful. Compression helps to reduce swelling and promotes shrinking of the hematoma. Make sure the bandage is not wrapped too tight.   If your hematoma is on a lower extremity and is painful, crutches may be helpful for a couple days.   Only take over-the-counter or prescription medicines as directed by your health care provider. SEEK IMMEDIATE MEDICAL CARE IF:   You have increasing pain, or your pain is not controlled with medicine.   You have a fever.   You have worsening swelling or discoloration.   Your skin over the hematoma breaks  or starts bleeding.   Your hematoma is in your chest or abdomen and you have weakness, shortness of breath, or a change in consciousness.  Your hematoma is on your scalp (caused by a fall or injury) and you have a worsening headache or a change in alertness or consciousness. MAKE SURE YOU:   Understand these instructions.  Will watch your condition.  Will get help right away if you are not doing well or get worse. Document Released: 04/18/2004 Document Revised: 05/07/2013 Document Reviewed: 02/12/2013 [facility] Patient Information 2015  [place], [place]. This information is not intended to replace advice given to you by your health care provider. Make sure you discuss any questions you have with your health care provider.

## 2014-11-04 NOTE — Assessment & Plan Note (Signed)
Will attempt to try 15 mg Phentermine daily. Patient to call if any intolerance is noted with this lower dose. Continue TLC.  Follow-up 1 month.

## 2014-11-04 NOTE — Progress Notes (Signed)
Patient presents to clinic today for follow-up of multiple issues and an acute concern.  Obesity -- was previously on Phentermine 30 mg daily.  Endorses losing 7 pounds 10 days after starting medication.  Noticed she felt jittery with the 30 mg tablet.  Has been watching diet and exercising 3-4 days per week.  Is wanting to know if a lower dose may work without giving her the jitteriness.  Smoking Cessation --  Was previously prescribed Chantix for smoking cessation.  Did not get medication filled at the time.  HAs been trying to quit on her own and she has been doing better.  Is now on e-cigarettes mostly. Does have starter pack now and is going to start the medication.  Patient endorses hitting her leg on something a week ago.  Had some pain in the area and mild swelling.  Felt a pop and since then the swelling resolved but she developed a large bruise.  Notes it is improving daily but still slightly tender.  Denies bruising elsewhere.  Past Medical History  Diagnosis Date  . Chicken pox   . GERD (gastroesophageal reflux disease)   . Elevated blood pressure   . Drug abuse in remission   . UTI (lower urinary tract infection)   . Hypolipidemia   . STD (sexually transmitted disease)     gc over 38yrs ago    Current Outpatient Prescriptions on File Prior to Visit  Medication Sig Dispense Refill  . omeprazole (PRILOSEC) 40 MG capsule Take 1 capsule (40 mg total) by mouth daily. 30 capsule 1   No current facility-administered medications on file prior to visit.    No Known Allergies  Family History  Problem Relation Age of Onset  . Alcoholism Father     Deceased  . Drug abuse Father   . Prostate cancer Father   . Hypertension Mother     Living  . Diabetes Mother   . Hyperlipidemia Mother   . Heart murmur Mother   . Alcoholism Maternal Grandfather   . Dementia Maternal Grandfather   . Arthritis Maternal Grandmother   . Breast cancer Maternal Grandmother   . Stroke  Maternal Grandmother   . Hypertension Maternal Grandmother   . Stroke Maternal Aunt   . Colon cancer Maternal Aunt   . Hyperlipidemia Maternal Aunt   . Alcoholism Maternal Uncle     x2  . Alcoholism Maternal Aunt   . Aneurysm Maternal Aunt   . Hypertension Maternal Aunt     x2  . Stroke Brother   . Drug abuse Brother   . Heart disease Brother   . Bone cancer Paternal Aunt     History   Social History  . Marital Status: Single    Spouse Name: N/A  . Number of Children: N/A  . Years of Education: N/A   Social History Main Topics  . Smoking status: Current Every Day Smoker -- 1.00 packs/day    Types: Cigarettes  . Smokeless tobacco: Never Used     Comment: D/C Chantix.  Marland Kitchen Alcohol Use: No  . Drug Use: No  . Sexual Activity:    Partners: Male    Birth Control/ Protection: None   Other Topics Concern  . None   Social History Narrative   Review of Systems - See HPI.  All other ROS are negative.  BP 122/88 mmHg  Pulse 82  Temp(Src) 98 F (36.7 C) (Oral)  Resp 16  Ht 5' 2.25" (1.581 m)  Wt  223 lb 8 oz (101.379 kg)  BMI 40.56 kg/m2  SpO2 100%  LMP 11/04/2014  Physical Exam  Constitutional: She is oriented to person, place, and time and well-developed, well-nourished, and in no distress.  HENT:  Head: Normocephalic and atraumatic.  Eyes: Conjunctivae are normal. Pupils are equal, round, and reactive to light.  Cardiovascular: Normal rate, regular rhythm, normal heart sounds and intact distal pulses.   Pulmonary/Chest: Effort normal and breath sounds normal. No respiratory distress. She has no wheezes. She has no rales. She exhibits no tenderness.  Neurological: She is alert and oriented to person, place, and time.  Skin:     Vitals reviewed.  Assessment/Plan: Hematoma of leg Discussed prognosis.  Rest, ice, elevation and OTC pain relief.   Morbid obesity Will attempt to try 15 mg Phentermine daily. Patient to call if any intolerance is noted with this  lower dose. Continue TLC.  Follow-up 1 month.   Smoking trying to quit Patient encouraged to start the Chantix she has sitting at home.

## 2014-11-04 NOTE — Assessment & Plan Note (Signed)
Discussed prognosis.  Rest, ice, elevation and OTC pain relief.

## 2014-11-04 NOTE — Assessment & Plan Note (Signed)
Patient encouraged to start the Chantix she has sitting at home.

## 2014-11-13 ENCOUNTER — Ambulatory Visit: Payer: PRIVATE HEALTH INSURANCE | Admitting: Physician Assistant

## 2014-12-04 ENCOUNTER — Ambulatory Visit: Payer: PRIVATE HEALTH INSURANCE | Admitting: Physician Assistant

## 2015-01-15 ENCOUNTER — Encounter: Payer: Self-pay | Admitting: Physician Assistant

## 2015-01-15 ENCOUNTER — Ambulatory Visit (HOSPITAL_BASED_OUTPATIENT_CLINIC_OR_DEPARTMENT_OTHER)
Admission: RE | Admit: 2015-01-15 | Discharge: 2015-01-15 | Disposition: A | Discharge status: Home / Self Care | Payer: PRIVATE HEALTH INSURANCE | Source: Ambulatory Visit | Attending: Physician Assistant | Admitting: Physician Assistant

## 2015-01-15 ENCOUNTER — Ambulatory Visit (INDEPENDENT_AMBULATORY_CARE_PROVIDER_SITE_OTHER): Payer: PRIVATE HEALTH INSURANCE | Admitting: Physician Assistant

## 2015-01-15 VITALS — BP 128/84 | HR 83 | Temp 98.1°F | Resp 16 | Ht 62.25 in | Wt 218.0 lb

## 2015-01-15 DIAGNOSIS — R1084 Generalized abdominal pain: Secondary | ICD-10-CM | POA: Insufficient documentation

## 2015-01-15 DIAGNOSIS — R109 Unspecified abdominal pain: Secondary | ICD-10-CM | POA: Insufficient documentation

## 2015-01-15 DIAGNOSIS — R1030 Lower abdominal pain, unspecified: Secondary | ICD-10-CM

## 2015-01-15 DIAGNOSIS — R102 Pelvic and perineal pain: Secondary | ICD-10-CM

## 2015-01-15 DIAGNOSIS — N7011 Chronic salpingitis: Secondary | ICD-10-CM

## 2015-01-15 LAB — POCT URINALYSIS DIPSTICK
Bilirubin, UA: NEGATIVE
Blood, UA: NEGATIVE
GLUCOSE UA: NEGATIVE
Ketones, UA: NEGATIVE
LEUKOCYTES UA: NEGATIVE
NITRITE UA: NEGATIVE
PH UA: 5.5
Protein, UA: POSITIVE
Spec Grav, UA: >=1.03
Urobilinogen, UA: NEGATIVE

## 2015-01-15 LAB — CBC
HCT: 41 % (ref 36.0–46.0)
HEMOGLOBIN: 13.8 g/dL (ref 12.0–15.0)
MCHC: 33.6 g/dL (ref 30.0–36.0)
MCV: 84.8 fl (ref 78.0–100.0)
Platelets: 212 10*3/uL (ref 150.0–400.0)
RBC: 4.84 Mil/uL (ref 3.87–5.11)
RDW: 13.3 % (ref 11.5–15.5)
WBC: 9.3 10*3/uL (ref 4.0–10.5)

## 2015-01-15 LAB — COMPREHENSIVE METABOLIC PANEL
ALK PHOS: 98 U/L (ref 39–117)
ALT: 15 U/L (ref 0–35)
AST: 20 U/L (ref 0–37)
Albumin: 3.7 g/dL (ref 3.5–5.2)
BUN: 10 mg/dL (ref 6–23)
CO2: 24 mEq/L (ref 19–32)
CREATININE: 0.77 mg/dL (ref 0.40–1.20)
Calcium: 9.2 mg/dL (ref 8.4–10.5)
Chloride: 107 mEq/L (ref 96–112)
GFR: 105.8 mL/min (ref >60.00)
Glucose, Bld: 74 mg/dL (ref 70–99)
Potassium: 3.8 mEq/L (ref 3.5–5.1)
Sodium: 138 mEq/L (ref 135–145)
Total Bilirubin: 0.5 mg/dL (ref 0.2–1.2)
Total Protein: 6.8 g/dL (ref 6.0–8.3)

## 2015-01-15 LAB — POCT URINE PREGNANCY: Preg Test, Ur: NEGATIVE

## 2015-01-15 LAB — LIPASE: Lipase: 11 U/L (ref 11.0–59.0)

## 2015-01-15 NOTE — Progress Notes (Signed)
Pre visit review using our clinic review tool, if applicable. No additional management support is needed unless otherwise documented below in the visit note. 

## 2015-01-15 NOTE — Assessment & Plan Note (Signed)
Suspect constipation related.  Urine preg and Urine dip unremarkable.  Will obtain CBC, CMP, lipase, DG Abdomen and urine culture to further assess.  Increase fluids.  Start stool softener and fiber supplement. Stay active. Will alter regimen based on results.

## 2015-01-15 NOTE — Patient Instructions (Signed)
Suspect symptoms are related to mild constipation that is causing added pressure to bladder.  Your urine preg and Urine dip unremarkable. Increase your fluids.  Start stool softener and fiber supplement. Stay active.   Go downstairs for x-ray. I will call you with your results.

## 2015-01-15 NOTE — Progress Notes (Signed)
Patient presents to clinic today for 1 month follow-up of obesity after starting Phentermine 15 mg daily. Patient is not taking medication at present due to complaints below.  Patient also endorses pain in lower and middle abdomen x 6 weeks.  Endorses pain is pressure-like and sore in nature and intermittent.  Pain does not radiate. Denies dysuria, hematuria, nausea, vomiting, fever, chills. Denies trauma or injury.  Patient endorses mild heart burn with certain foods. Patient with a history of GERD but is not taking her PPI daily as directed.  Patient endorses she is 1 week late for menstrual period.  Is currently sexually active.   Past Medical History  Diagnosis Date  . Chicken pox   . GERD (gastroesophageal reflux disease)   . Elevated blood pressure   . Drug abuse in remission   . UTI (lower urinary tract infection)   . Hypolipidemia   . STD (sexually transmitted disease)     gc over 32yrs ago    Current Outpatient Prescriptions on File Prior to Visit  Medication Sig Dispense Refill  . omeprazole (PRILOSEC) 40 MG capsule Take 1 capsule (40 mg total) by mouth daily. (Patient not taking: Reported on 01/15/2015) 30 capsule 1   No current facility-administered medications on file prior to visit.    No Known Allergies  Family History  Problem Relation Age of Onset  . Alcoholism Father     Deceased  . Drug abuse Father   . Prostate cancer Father   . Hypertension Mother     Living  . Diabetes Mother   . Hyperlipidemia Mother   . Heart murmur Mother   . Alcoholism Maternal Grandfather   . Dementia Maternal Grandfather   . Arthritis Maternal Grandmother   . Breast cancer Maternal Grandmother   . Stroke Maternal Grandmother   . Hypertension Maternal Grandmother   . Stroke Maternal Aunt   . Colon cancer Maternal Aunt   . Hyperlipidemia Maternal Aunt   . Alcoholism Maternal Uncle     x2  . Alcoholism Maternal Aunt   . Aneurysm Maternal Aunt   . Hypertension Maternal Aunt      x2  . Stroke Brother   . Drug abuse Brother   . Heart disease Brother   . Bone cancer Paternal Aunt     History   Social History  . Marital Status: Single    Spouse Name: N/A  . Number of Children: N/A  . Years of Education: N/A   Social History Main Topics  . Smoking status: Current Every Day Smoker -- 1.00 packs/day    Types: Cigarettes  . Smokeless tobacco: Never Used     Comment: D/C Chantix.  Marland Kitchen Alcohol Use: No  . Drug Use: No  . Sexual Activity:    Partners: Male    Birth Control/ Protection: None   Other Topics Concern  . None   Social History Narrative   Review of Systems - See HPI.  All other ROS are negative.  BP 128/84 mmHg  Pulse 83  Temp(Src) 98.1 F (36.7 C) (Oral)  Resp 16  Ht 5' 2.25" (1.581 m)  Wt 218 lb (98.884 kg)  BMI 39.56 kg/m2  SpO2 98%  LMP 12/06/2014  Physical Exam  Constitutional: She is oriented to person, place, and time and well-developed, well-nourished, and in no distress.  HENT:  Head: Normocephalic and atraumatic.  Eyes: Conjunctivae are normal.  Cardiovascular: Normal rate, regular rhythm, normal heart sounds and intact distal pulses.   Pulmonary/Chest:  Effort normal and breath sounds normal. No respiratory distress. She has no wheezes. She has no rales. She exhibits no tenderness.  Abdominal: Soft. Bowel sounds are normal. She exhibits no distension and no mass. There is generalized tenderness. There is no rigidity, no rebound, no guarding, no CVA tenderness, no tenderness at McBurney's point and negative Murphy's sign.  Neurological: She is alert and oriented to person, place, and time.  Skin: Skin is warm. No rash noted.  Psychiatric: Affect normal.  Vitals reviewed.  Assessment/Plan: Generalized abdominal pain Suspect constipation related.  Urine preg and Urine dip unremarkable.  Will obtain CBC, CMP, lipase, DG Abdomen and urine culture to further assess.  Increase fluids.  Start stool softener and fiber supplement.  Stay active. Will alter regimen based on results.

## 2015-01-17 DIAGNOSIS — IMO0002 Reserved for concepts with insufficient information to code with codable children: Secondary | ICD-10-CM

## 2015-01-17 HISTORY — DX: Reserved for concepts with insufficient information to code with codable children: IMO0002

## 2015-01-17 LAB — CULTURE, URINE COMPREHENSIVE
COLONY COUNT: NO GROWTH
Organism ID, Bacteria: NO GROWTH

## 2015-01-18 NOTE — Addendum Note (Signed)
Addended by: Peggyann Shoals on: 01/18/2015 02:52 PM   Modules accepted: Orders

## 2015-01-19 ENCOUNTER — Other Ambulatory Visit: Payer: Self-pay | Admitting: Physician Assistant

## 2015-01-19 DIAGNOSIS — R102 Pelvic and perineal pain: Secondary | ICD-10-CM

## 2015-01-19 DIAGNOSIS — R1030 Lower abdominal pain, unspecified: Secondary | ICD-10-CM

## 2015-01-20 ENCOUNTER — Encounter: Payer: Self-pay | Admitting: Physician Assistant

## 2015-01-20 ENCOUNTER — Telehealth: Payer: Self-pay | Admitting: Physician Assistant

## 2015-01-20 NOTE — Telephone Encounter (Signed)
Caller name: Asiah Relation to pt: self Call back number: [phone]  Pharmacy:  Reason for call:   Patient states that she is still not feeling well and would like a work note from dates 01/21/15-01/23/15 and return to work on Sunday. Ok to leave a detailed message.

## 2015-01-20 NOTE — Telephone Encounter (Signed)
Informed patient of this and she states that she will pickup tomorrow.

## 2015-01-20 NOTE — Telephone Encounter (Signed)
Note written. At front desk for pickup.

## 2015-01-21 ENCOUNTER — Ambulatory Visit (HOSPITAL_BASED_OUTPATIENT_CLINIC_OR_DEPARTMENT_OTHER)
Admission: RE | Admit: 2015-01-21 | Discharge: 2015-01-21 | Disposition: A | Discharge status: Home / Self Care | Payer: PRIVATE HEALTH INSURANCE | Source: Ambulatory Visit | Attending: Physician Assistant | Admitting: Physician Assistant

## 2015-01-21 DIAGNOSIS — D259 Leiomyoma of uterus, unspecified: Secondary | ICD-10-CM | POA: Diagnosis not present

## 2015-01-21 DIAGNOSIS — R102 Pelvic and perineal pain: Secondary | ICD-10-CM

## 2015-01-21 DIAGNOSIS — R109 Unspecified abdominal pain: Secondary | ICD-10-CM | POA: Diagnosis present

## 2015-01-21 DIAGNOSIS — R938 Abnormal findings on diagnostic imaging of other specified body structures: Secondary | ICD-10-CM | POA: Diagnosis not present

## 2015-01-21 DIAGNOSIS — R1030 Lower abdominal pain, unspecified: Secondary | ICD-10-CM

## 2015-01-21 NOTE — Addendum Note (Signed)
Addended by: Leticia Penna A on: 01/21/2015 11:45 AM   Modules accepted: Orders

## 2015-01-25 ENCOUNTER — Encounter: Payer: Self-pay | Admitting: Physician Assistant

## 2015-01-25 ENCOUNTER — Telehealth: Payer: Self-pay | Admitting: Physician Assistant

## 2015-01-25 NOTE — Telephone Encounter (Signed)
Contacted PT - informed of status

## 2015-01-25 NOTE — Telephone Encounter (Signed)
Note written.  Is at front desk for pick up or we can fax to her if she has a fax.

## 2015-01-25 NOTE — Telephone Encounter (Signed)
Caller name:Lexy Relationship to patient:self Can be [phone]   Reason for call: Pt states that Elyn Aquas wrote a work excuse- stayed out of work longer than note. Requesting new note be written starting May 5th 2016- May 8th 2016. Returning to work Monday May 9th 2016.

## 2015-01-27 ENCOUNTER — Other Ambulatory Visit (HOSPITAL_COMMUNITY)
Admission: RE | Admit: 2015-01-27 | Discharge: 2015-01-27 | Disposition: A | Discharge status: Home / Self Care | Payer: PRIVATE HEALTH INSURANCE | Source: Ambulatory Visit | Attending: Gynecology | Admitting: Gynecology

## 2015-01-27 ENCOUNTER — Telehealth: Payer: Self-pay | Admitting: *Deleted

## 2015-01-27 ENCOUNTER — Ambulatory Visit (INDEPENDENT_AMBULATORY_CARE_PROVIDER_SITE_OTHER): Payer: PRIVATE HEALTH INSURANCE | Admitting: Gynecology

## 2015-01-27 ENCOUNTER — Encounter: Payer: Self-pay | Admitting: Gynecology

## 2015-01-27 ENCOUNTER — Encounter: Payer: Self-pay | Admitting: Internal Medicine

## 2015-01-27 VITALS — BP 130/82 | Ht 63.0 in | Wt 220.0 lb

## 2015-01-27 DIAGNOSIS — R109 Unspecified abdominal pain: Secondary | ICD-10-CM

## 2015-01-27 DIAGNOSIS — R87810 Cervical high risk human papillomavirus (HPV) DNA test positive: Secondary | ICD-10-CM | POA: Diagnosis present

## 2015-01-27 DIAGNOSIS — Z1151 Encounter for screening for human papillomavirus (HPV): Secondary | ICD-10-CM | POA: Insufficient documentation

## 2015-01-27 DIAGNOSIS — Z124 Encounter for screening for malignant neoplasm of cervix: Secondary | ICD-10-CM

## 2015-01-27 DIAGNOSIS — Z01419 Encounter for gynecological examination (general) (routine) without abnormal findings: Secondary | ICD-10-CM | POA: Insufficient documentation

## 2015-01-27 DIAGNOSIS — N8320 Unspecified ovarian cysts: Secondary | ICD-10-CM

## 2015-01-27 DIAGNOSIS — Z9189 Other specified personal risk factors, not elsewhere classified: Secondary | ICD-10-CM

## 2015-01-27 DIAGNOSIS — R102 Pelvic and perineal pain: Secondary | ICD-10-CM

## 2015-01-27 DIAGNOSIS — N83202 Unspecified ovarian cyst, left side: Secondary | ICD-10-CM

## 2015-01-27 NOTE — Progress Notes (Signed)
Nicole Mueller [birth year] [id number]        43 y.o.  [id number] new patient presents for annual exam and evaluation of abdominal pain. Patient notes over the last 2 months generalized abdominal pain both upper mid epigastric as well as lower pelvic. Comes and goes cramping to sharp stabbing. She thought was due to being on her feet a lot. Was evaluated through her primary physician where an ultrasound was ordered. Uterus was grossly normal in size with 2 small myomas largest measuring 1.5 cm. There was a left septated cystic mass 3.5 x 2.4 x 2.9 cm. And a suggestion of a small left hydrosalpinx. The patient was referred for further evaluation. Has some mild nausea but no vomiting. No frequency dysuria or urgency. Is having some constipation recently started on stool softeners. No fever or chills.  Having regular monthly menses with no intermenstrual bleeding. Currently sexual active using no contraception and would accept pregnancy occurs.  Past medical history,surgical history, problem list, medications, allergies, family history and social history were all reviewed and documented as reviewed in the EPIC chart.  ROS:  Performed with pertinent positives and negatives included in the history, assessment and plan.   Additional significant findings :  none   Exam: Kim Counsellor Vitals:   01/27/15 1001  BP: 130/82  Height: 5\' 3"  (1.6 m)  Weight: 220 lb (99.791 kg)   General appearance:  Normal affect, orientation and appearance. Skin: Grossly normal HEENT: Without gross lesions.  No cervical or supraclavicular adenopathy. Thyroid normal.  Lungs:  Clear without wheezing, rales or rhonchi Cardiac: RR, without RMG Abdominal:  Soft, nontender, without masses, guarding, rebound, organomegaly or hernia Breasts:  Examined lying and sitting without masses, retractions, discharge or axillary adenopathy. Pelvic:  Ext/BUS/vagina normal  Cervix normal. Pap smear/HPV done  Uterus retroverted, normal  size, shape and contour, midline and mobile nontender   Adnexa  Without masses or tenderness    Anus and perineum  Normal   Rectovaginal  Normal sphincter tone without palpated masses or tenderness.    Assessment/Plan:  44 y.o. [id number] female for annual exam with regular menses contraception..   1. Generalized abdominal pain, cystic left adnexal mass. Small in nature. Given the upper and lower component of her abdominal pain I doubt due to the cyst. Possibilities to include tumor versus physiologic as well as hydrosalpinx all discussed with her. Options of laparoscopic evaluation now versus expectant management with GI referral and follow up ultrasound discussed. At this point the patient's comfortable with expectant management. We'll check baseline CA 125 and will arrange a follow up appointment with a gastroenterologist. She'll return in one month for repeat ultrasound. Various scenarios were reviewed and the potential for laparoscopic evaluation discussed. Follow up sooner if acute changes in the pain. 2. Pap smear 2014. Pap smear/HPV today as I do not have prior records. No history of significant abnormal Pap smears previously. 3. Contraception. Not currently using contraception and would accept pregnancy if occurs.  Multivitamin with folic acid recommended. 4. Mammography 2014. Recommended baseline mammogram now and patient agrees to schedule. SBE monthly reviewed. 5. Health maintenance. No routine blood work done as she reports this done at her primary physician's office.  Follow up in one month for repeat ultrasound. Follow up with gastroenterologist as arranged.     Anastasio Auerbach MD, 11:56 AM 01/27/2015

## 2015-01-27 NOTE — Telephone Encounter (Signed)
-----   Message from Anastasio Auerbach, MD sent at 01/27/2015 12:04 PM EDT ----- Schedule an appointment with [facility] gastroenterology reference abdominal pain.

## 2015-01-27 NOTE — Telephone Encounter (Signed)
Referral placed for GI they will contact pt to schedule.

## 2015-01-27 NOTE — Patient Instructions (Signed)
Follow up for repeat ultrasound in one month. Office will contact you to arrange the gastroenterology appointment.

## 2015-01-28 ENCOUNTER — Telehealth: Payer: Self-pay

## 2015-01-28 LAB — CYTOLOGY - PAP

## 2015-01-28 LAB — CA 125: CA 125: 20 U/mL (ref <35)

## 2015-01-28 NOTE — Telephone Encounter (Signed)
Appointment on 02/03/15 @ 1:00pm with Dr.Ganmen notes faxed, pt aware

## 2015-01-28 NOTE — Telephone Encounter (Signed)
-----   Message from Anastasio Auerbach, MD sent at 01/28/2015  8:06 AM EDT ----- Tell patient the "ovarian cancer" blood test was negative.

## 2015-01-28 NOTE — Telephone Encounter (Signed)
appointment 04/01/15 @ 10:30am with Dr.Gessner

## 2015-01-28 NOTE — Telephone Encounter (Signed)
Patient said you told her to see GI MD at [facility].  Nicole Mueller placed referral.)  They called her and scheduled her for July 14, their first appointment. She asked me to let you know that was soonest they could schedule her.

## 2015-01-28 NOTE — Telephone Encounter (Signed)
Sent this to Anderson Malta so she can check on rescheduling her with [facility].

## 2015-01-28 NOTE — Telephone Encounter (Signed)
Check with [facility] gastroenterology or Dr. Collene Mares to see if they can see her sooner.

## 2015-01-28 NOTE — Telephone Encounter (Signed)
-----   Message from Ramond Craver, [place] sent at 01/28/2015 10:46 AM EDT ----- Regarding: referral to GI Check with [facility] gastroenterology or Dr. Collene Mares to see if they can see her sooner  (This is one who you sent referral to [facility] and they scheduled her for July.)  Thanks!!!

## 2015-02-01 ENCOUNTER — Encounter: Payer: Self-pay | Admitting: Gynecology

## 2015-02-11 ENCOUNTER — Other Ambulatory Visit: Payer: Self-pay | Admitting: Gastroenterology

## 2015-02-11 DIAGNOSIS — R1013 Epigastric pain: Secondary | ICD-10-CM

## 2015-02-16 ENCOUNTER — Ambulatory Visit: Payer: PRIVATE HEALTH INSURANCE | Admitting: Physician Assistant

## 2015-02-19 ENCOUNTER — Ambulatory Visit
Admission: RE | Admit: 2015-02-19 | Discharge: 2015-02-19 | Disposition: A | Discharge status: Home / Self Care | Payer: PRIVATE HEALTH INSURANCE | Source: Ambulatory Visit | Attending: Gastroenterology | Admitting: Gastroenterology

## 2015-02-19 DIAGNOSIS — R1013 Epigastric pain: Secondary | ICD-10-CM

## 2015-02-26 ENCOUNTER — Encounter: Payer: Self-pay | Admitting: Gynecology

## 2015-02-26 ENCOUNTER — Ambulatory Visit (INDEPENDENT_AMBULATORY_CARE_PROVIDER_SITE_OTHER): Payer: PRIVATE HEALTH INSURANCE | Admitting: Gynecology

## 2015-02-26 ENCOUNTER — Encounter: Payer: Self-pay | Admitting: Nurse Practitioner

## 2015-02-26 ENCOUNTER — Ambulatory Visit (INDEPENDENT_AMBULATORY_CARE_PROVIDER_SITE_OTHER): Payer: PRIVATE HEALTH INSURANCE | Admitting: Nurse Practitioner

## 2015-02-26 ENCOUNTER — Ambulatory Visit (INDEPENDENT_AMBULATORY_CARE_PROVIDER_SITE_OTHER): Payer: PRIVATE HEALTH INSURANCE

## 2015-02-26 ENCOUNTER — Other Ambulatory Visit: Payer: Self-pay | Admitting: Gynecology

## 2015-02-26 VITALS — BP 117/83 | HR 95 | Temp 98.0°F | Resp 16 | Wt 223.0 lb

## 2015-02-26 DIAGNOSIS — G44209 Tension-type headache, unspecified, not intractable: Secondary | ICD-10-CM | POA: Diagnosis not present

## 2015-02-26 DIAGNOSIS — D251 Intramural leiomyoma of uterus: Secondary | ICD-10-CM

## 2015-02-26 DIAGNOSIS — R102 Pelvic and perineal pain: Secondary | ICD-10-CM

## 2015-02-26 DIAGNOSIS — R1900 Intra-abdominal and pelvic swelling, mass and lump, unspecified site: Secondary | ICD-10-CM

## 2015-02-26 DIAGNOSIS — N7011 Chronic salpingitis: Secondary | ICD-10-CM

## 2015-02-26 DIAGNOSIS — N8320 Unspecified ovarian cysts: Secondary | ICD-10-CM | POA: Diagnosis not present

## 2015-02-26 DIAGNOSIS — N83202 Unspecified ovarian cyst, left side: Secondary | ICD-10-CM

## 2015-02-26 DIAGNOSIS — J309 Allergic rhinitis, unspecified: Secondary | ICD-10-CM

## 2015-02-26 MED ORDER — METHOCARBAMOL 750 MG PO TABS: 750.0000 mg | ORAL_TABLET | Freq: Every day | ORAL | Status: DC | PRN

## 2015-02-26 NOTE — Patient Instructions (Signed)
Call with your decision as far as surgery.

## 2015-02-26 NOTE — Progress Notes (Signed)
Nicole Mueller 17-Nov-1970 [id number]        43 y.o.  [id number] presents for follow up ultrasound. Patient was seen last month due to abdominal pain. Described as cramping to sharp stabbing coming and going. Still feels some of this pain although seems a little better. She had an ultrasound through her primary physician's office which showed a left sided septated cystic mass 35 x 24 x 29 mm. Suggestive of a small hydrosalpinx. Was recommended to follow up for repeat ultrasound now.  CA 125 returned 20  Past medical history,surgical history, problem list, medications, allergies, family history and social history were all reviewed and documented in the EPIC chart.  Directed ROS with pertinent positives and negatives documented in the history of present illness/assessment and plan.  Exam: There were no vitals filed for this visit. General appearance:  Normal  Ultrasound shows uterus with small myomas 17 mm, 16 mm, 10 mm. Endometrial echo 6.7 mm. Right adnexa with a serpentine tubular cystic mass 44 x 11 mm with negative color-flow Doppler. Ovary appears normal. Left ovary normal surrounded by serpentine tubular cystic mass negative color-flow with numerous folds 74 x 48 x 72 mm. Cul-de-sac with small amount of fluid.  Assessment/Plan:  44 y.o. 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 Patient continues to have pain although seems a little bit better. Options reviewed to include expectant management versus surgical intervention to include attempted laparoscopy with realistic possibility of exploratory laparotomy. She has not been using contraceptives and has not achieved pregnancy which is not surprising given the probable hydrosalpinx. I reviewed with her that if we did surgery bilateral salpingectomy would be the procedure of choice. This obviously would eliminate any possibility of pregnancy naturally and that her only option would be in vitro. Given the probable hydrosalpinx at this point her only  option would be in vitro. Attempt at salpingostomies to open the tubes would be one possibility but the likelihood of success from a pregnancy standpoint very low and increased risk of ectopic pregnancy reviewed. Option to be referred to a reproductive endocrinologist now also discussed and offered. Patient at this point is leaning towards attempted laparoscopy with bilateral salpingectomies. She would understand except the permanent sterilization aspect of this. I've asked her to go home and discuss this with her husband so that they feel comfortable with whatever decision they make. Again one option would be to do nothing and repeat ultrasound in another 2 months and see where her pain is doing and then go from there. Issues of cannot guarantee it is not carcinoma also discussed.  Patient also had multiple questions about HPV. I reviewed the whole issue of HPV with her to include how common it is, the issues of spontaneous resolution as well as the potential to carry the virus for numerous years. The consequences and possibilities in both female and males and the need for continued follow up reviewed.    Anastasio Auerbach MD, 11:42 AM 02/26/2015

## 2015-02-26 NOTE — Progress Notes (Signed)
Pre visit review using our clinic review tool, if applicable. No additional management support is needed unless otherwise documented below in the visit note. 

## 2015-02-26 NOTE — Patient Instructions (Signed)
Try muscle relaxer for headache. You may also use advil as directed.  Start daily sinus wash-Neilmed sinus rinse-for best allergy management. It will decrease need to clear throat from post-nasal drip.  Visit you tube for these exercise regimens: "physio Neck Exercises: Stretch & Relieve routine" by Mauri Brooklyn; "Pilates Seated Stretch" by [employer]; "Physio med Neck & Upper body stretch Exercises-Occupational Physiotherapy". Get up every hour while at work & do neck & shoulder exercises-take 5-7 minutes.   Let us know if you are not feeling better.

## 2015-02-26 NOTE — Progress Notes (Signed)
Subjective:    Nicole Mueller is a 44 y.o. female who presents for evaluation of headache. Symptoms began about 1 week ago. She had "burst" vessel in R eye day HA started. She is worried she will have stroke or that BP is too high. She took advil w/complete relief. She has not repeated dose. Pain is located back of head & across forehead. 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 She denies vision change, dizzy, fever, sore throat, cough, nausea. She c/o frequent clearing of throat & popping in ears.  The following portions of the patient's history were reviewed and updated as appropriate: allergies, current medications, past medical history, past social history, past surgical history and problem list.  Review of Systems Pertinent items are noted in HPI.    Objective:    BP 117/83 mmHg  Pulse 95  Temp(Src) 98 F (36.7 C) (Oral)  Resp 16  Wt 223 lb (101.152 kg)  SpO2 97%  LMP 02/15/2015 General appearance: alert, cooperative, appears stated age and no distress Head: Normocephalic, without obvious abnormality, atraumatic Eyes: negative findings: lids and lashes normal, conjunctivae and sclerae normal, corneas clear, pupils equal, round, reactive to light and accomodation and visual fields full to confrontation Ears: fluid air level both TM Throat: lips, mucosa, and tongue normal; teeth and gums normal Neck: no adenopathy, supple, symmetrical, trachea midline and thyroid not enlarged, symmetric, no tenderness/mass/nodules Lungs: clear to auscultation bilaterally Heart: regular rate and rhythm, S1, S2 normal, no murmur, click, rub or gallop Neurologic: Grossly normal    Assessment:Plan     1. Tension headache - methocarbamol (ROBAXIN) 750 MG tablet; Take 1 tablet (750 mg total) by mouth daily as needed for muscle spasms.  Dispense: 15 tablet; Refill: 0 advil PRN as directed Neck & shoulder exerises  2. Allergic rhinitis,  unspecified allergic rhinitis type Daily sinus wash  F/u PRN

## 2015-03-04 ENCOUNTER — Telehealth: Payer: Self-pay | Admitting: Gynecology

## 2015-03-04 NOTE — Telephone Encounter (Signed)
Erroneous entry

## 2015-03-25 ENCOUNTER — Telehealth: Payer: Self-pay

## 2015-03-25 NOTE — Telephone Encounter (Addendum)
Patient called wanting me to give her an idea about cost for surgery and ins benefits.  She said you told her surgery would be to have "fluid drained from the fallopian tube". She wasn't sure what kind of surgery she said that was how you explained it.  Any idea what she is referring to so I can check cost for her?

## 2015-03-29 NOTE — Telephone Encounter (Signed)
Patient informed. I read her paragraph below and explained some of the terms as we read. She wants to discuss options with her mom and she will let me know. She still would like for me to check in benefits for Lap Bilat Salpingectomy and let her know.

## 2015-03-29 NOTE — Telephone Encounter (Signed)
I had talked to her about laparoscopy with bilateral salpingectomy. I was not in favor of trying to drain the fallopian tubes as this has a poor intrauterine pregnancy rate and increased ectopic pregnancy rate. If she is interested in salvaging the fallopian tubes then I would recommend an appointment with a reproductive endocrinologist, i.e. Dr. Kerin Perna to discuss options as far as pregnancy with him. Obviously if she has a bilateral salpingectomy she would be sterilized and would have to pursue in vitro fertilization regardless. I would recommend if she is at all interested in pregnancy to make an appointment first with reproductive endocrinologist to discuss her situation and options.  Would recommend that she also takes a copy of the ultrasound with her.

## 2015-03-30 ENCOUNTER — Telehealth: Payer: Self-pay

## 2015-03-30 NOTE — Telephone Encounter (Signed)
Patient had asked me to check ins benefits for her to have Lap Bilat Salpingectomy.  I informed her that her plan year runs from 9/1-8/31 of each year.  She has $5000 deductible and then plans pays 100% of in network charges.  Her [facility] pre-payment would be $1105.  She said she was happy to have the information to consider and she will consider it all and let me know if she decides to schedule.

## 2015-04-01 ENCOUNTER — Ambulatory Visit: Payer: PRIVATE HEALTH INSURANCE | Admitting: Internal Medicine

## 2015-07-19 ENCOUNTER — Ambulatory Visit (HOSPITAL_BASED_OUTPATIENT_CLINIC_OR_DEPARTMENT_OTHER)
Admission: RE | Admit: 2015-07-19 | Discharge: 2015-07-19 | Disposition: A | Discharge status: Home / Self Care | Payer: PRIVATE HEALTH INSURANCE | Source: Ambulatory Visit | Attending: Physician Assistant | Admitting: Physician Assistant

## 2015-07-19 ENCOUNTER — Encounter: Payer: Self-pay | Admitting: Physician Assistant

## 2015-07-19 ENCOUNTER — Telehealth: Payer: Self-pay | Admitting: Physician Assistant

## 2015-07-19 ENCOUNTER — Ambulatory Visit (INDEPENDENT_AMBULATORY_CARE_PROVIDER_SITE_OTHER): Payer: PRIVATE HEALTH INSURANCE | Admitting: Physician Assistant

## 2015-07-19 VITALS — BP 135/98 | HR 78 | Temp 98.1°F | Resp 16 | Ht 63.0 in | Wt 222.1 lb

## 2015-07-19 DIAGNOSIS — M5440 Lumbago with sciatica, unspecified side: Secondary | ICD-10-CM | POA: Insufficient documentation

## 2015-07-19 DIAGNOSIS — M542 Cervicalgia: Secondary | ICD-10-CM | POA: Diagnosis not present

## 2015-07-19 DIAGNOSIS — M545 Low back pain: Secondary | ICD-10-CM

## 2015-07-19 DIAGNOSIS — R200 Anesthesia of skin: Secondary | ICD-10-CM | POA: Insufficient documentation

## 2015-07-19 DIAGNOSIS — M5412 Radiculopathy, cervical region: Secondary | ICD-10-CM | POA: Diagnosis not present

## 2015-07-19 DIAGNOSIS — M5430 Sciatica, unspecified side: Secondary | ICD-10-CM | POA: Diagnosis not present

## 2015-07-19 DIAGNOSIS — M5136 Other intervertebral disc degeneration, lumbar region: Secondary | ICD-10-CM | POA: Diagnosis not present

## 2015-07-19 MED ORDER — METHYLPREDNISOLONE 4 MG PO TBPK: ORAL_TABLET | ORAL | Status: DC

## 2015-07-19 MED ORDER — TRAMADOL HCL 50 MG PO TABS: 50.0000 mg | ORAL_TABLET | Freq: Three times a day (TID) | ORAL | Status: DC | PRN

## 2015-07-19 NOTE — Progress Notes (Signed)
Patient presents to clinic today c/o left neck pain radiating into left arm and hand with tingling and numbness x 2 weeks. Endorses heavy lifting at work but denies trauma or injury. Also endorses left leg pain with numbness and tingling.  Endorses leg feels swollen but denies actual swelling of leg. Denies fever, chills, malaise. Symptoms are improving slightly since onset but are still present. Has been taking Advil with some relief of symptoms.  Past Medical History  Diagnosis Date  . Chicken pox   . GERD (gastroesophageal reflux disease)   . Elevated blood pressure   . Drug abuse in remission   . UTI (lower urinary tract infection)   . Hypolipidemia   . STD (sexually transmitted disease)     gc over 70yrs ago  . 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   No current facility-administered medications on file prior to visit.    No Known Allergies  Family History  Problem Relation Age of Onset  . Alcoholism Father     Deceased  . Drug abuse Father   . Prostate cancer Father   . Hypertension Mother     Living  . Diabetes Mother   . Hyperlipidemia Mother   . Heart murmur Mother   . Alcoholism Maternal Grandfather   . Dementia Maternal Grandfather   . Arthritis Maternal Grandmother   . Breast cancer Maternal Grandmother 60  . Stroke Maternal Grandmother   . Hypertension Maternal Grandmother   . Stroke Maternal Aunt   . Colon cancer Maternal Aunt   . Hyperlipidemia Maternal Aunt   . Alcoholism Maternal Uncle     x2  . Alcoholism Maternal Aunt   . Aneurysm Maternal Aunt   . Hypertension Maternal Aunt     x2  . Stroke Brother   . Drug abuse Brother   . Heart disease Brother   . Bone cancer Paternal Aunt     Social History   Social History  . Marital Status: Single    Spouse Name: N/A  . Number of Children:  N/A  . Years of Education: N/A   Social History Main Topics  . Smoking status: Current Every Day Smoker -- 0.50 packs/day    Types: Cigarettes  . Smokeless tobacco: Never Used     Comment: D/C Chantix.  Marland Kitchen Alcohol Use: No  . Drug Use: No  . Sexual Activity:    Partners: Male    Birth Control/ Protection: None     Comment: 1st intercourse 44 yo- More than 5 partners   Other Topics Concern  . None   Social History Narrative   Review of Systems - See HPI.  All other ROS are negative.  BP 135/98 mmHg  Pulse 78  Temp(Src) 98.1 F (36.7 C) (Oral)  Resp 16  Ht 5\' 3"  (1.6 m)  Wt 222 lb 2 oz (100.755 kg)  BMI 39.36 kg/m2  SpO2 100%  LMP 06/18/2015  Physical Exam  Constitutional: She is oriented to person, place, and time and well-developed, well-nourished, and in no distress.  HENT:  Head: Normocephalic and atraumatic.  Cardiovascular: Normal rate, regular rhythm, normal heart sounds and intact distal pulses.   Pulmonary/Chest: Effort normal and breath sounds normal. No respiratory distress. She has no wheezes. She has no rales. She exhibits no tenderness.  Musculoskeletal:  Cervical back: She exhibits tenderness and pain. She exhibits no spasm.       Lumbar back: She exhibits tenderness and pain. She exhibits no spasm.  Neurological: She is alert and oriented to person, place, and time.  Skin: Skin is warm and dry. No rash noted.  Vitals reviewed.   No results found for this or any previous visit (from the past 2160 hour(s)).  Assessment/Plan: Cervical pain (neck) Will obtain x-ray today. Rx Medrol dose pack. Tramadol for breakthrough pain. Heating pad and rest. No heavy lifting. Follow-up 1 week.  Back pain of lumbar region with sciatica Will obtain x-ray today. Rx Medrol dose pack. Tramadol for breakthrough pain. Heating pad and rest. No heavy lifting. Follow-up 1 week.

## 2015-07-19 NOTE — Progress Notes (Signed)
Pre visit review using our clinic review tool, if applicable. No additional management support is needed unless otherwise documented below in the visit note/SLS  

## 2015-07-19 NOTE — Patient Instructions (Signed)
Please go downstairs for imaging. I will call you with your results. Please limit heavy lifting over the next 1-2 weeks. Take steroid pack (Medrol) as directed. Use Tramadol for breakthrough pain only.  Follow-up 1 week. Return immediately or ER if anything worsens.

## 2015-07-19 NOTE — Telephone Encounter (Signed)
Patient Name: Nicole Mueller DOB: [birth year] Initial Comment Caller states, has tingling / numbness in leg , arm on left side Nurse Assessment Nurse: Ronnald Ramp, RN, Miranda Date/Time (Eastern Time): 07/19/2015 10:09:58 AM Confirm and document reason for call. If symptomatic, describe symptoms. ---Caller states she has been having pain, tingling, and numbness in her left arm and leg for 2 weeks. Has the patient traveled out of the country within the last 30 days? ---Not Applicable Does the patient have any new or worsening symptoms? ---Yes Will a triage be completed? ---Yes Related visit to physician within the last 2 weeks? ---No Does the PT have any chronic conditions? (i.e. diabetes, asthma, etc.) ---No Did the patient indicate they were pregnant? ---No Guidelines Guideline Title Affirmed Question Affirmed Notes Arm Pain Numbness (i.e., loss of sensation) in hand or fingers Leg Pain [1] Thigh or calf pain AND [2] only 1 side AND [3] present > 1 hour Final Disposition User See Physician within 4 Hours (or PCP triage) Ronnald Ramp, RN, Miranda Comments During triage of arm pain, pt reports she is having pain in the left thigh. Appt scheduled to see Elyn Aquas at 11:15 am today. Disagree/Comply: Comply Disagree/Comply: Comply

## 2015-07-19 NOTE — Telephone Encounter (Signed)
Noted  

## 2015-07-19 NOTE — Assessment & Plan Note (Signed)
Will obtain x-ray today. Rx Medrol dose pack. Tramadol for breakthrough pain. Heating pad and rest. No heavy lifting. Follow-up 1 week.

## 2015-07-20 ENCOUNTER — Encounter: Payer: PRIVATE HEALTH INSURANCE | Admitting: Physician Assistant

## 2015-07-21 ENCOUNTER — Telehealth: Payer: Self-pay

## 2015-07-21 ENCOUNTER — Other Ambulatory Visit: Payer: Self-pay | Admitting: Physician Assistant

## 2015-07-21 MED ORDER — MELOXICAM 15 MG PO TABS: 15.0000 mg | ORAL_TABLET | Freq: Every day | ORAL | Status: DC

## 2015-07-21 NOTE — Telephone Encounter (Signed)
Patient states after taking MethylPrednisolone she has experienced sweats, nervousness,chest tightness(last night),soreness in chest this morning. Advised to stop medication until talking with Daun Peacock. Advised I would call her back.

## 2015-07-21 NOTE — Telephone Encounter (Signed)
Called patient with new orders.  Advised new medication has been sent to pharmacy. Patient informed to call if symptoms continue. Patient agreed.

## 2015-07-21 NOTE — Telephone Encounter (Signed)
Have her stop steroids completely. Ok to send in Rx Meloxicam 15 mg to take each morning with food. Quantity 20 0 refills. Continue Tramadol for breakthrough pain. Follow-up as scheduled.

## 2015-07-26 ENCOUNTER — Ambulatory Visit: Payer: PRIVATE HEALTH INSURANCE | Admitting: Physician Assistant

## 2015-07-27 ENCOUNTER — Encounter: Payer: Self-pay | Admitting: Physician Assistant

## 2015-07-27 ENCOUNTER — Ambulatory Visit (INDEPENDENT_AMBULATORY_CARE_PROVIDER_SITE_OTHER): Payer: PRIVATE HEALTH INSURANCE | Admitting: Physician Assistant

## 2015-07-27 ENCOUNTER — Telehealth: Payer: Self-pay | Admitting: *Deleted

## 2015-07-27 ENCOUNTER — Ambulatory Visit: Payer: PRIVATE HEALTH INSURANCE | Admitting: Physician Assistant

## 2015-07-27 NOTE — Progress Notes (Signed)
Error -- patient left before nurse went to room her.

## 2015-07-27 NOTE — Telephone Encounter (Signed)
FYI: Spoke with patient about her F/U appointment at 10:30 am to inform her that she is overdue for her complete CPE, appt was changed to a 30 minute physical; pt understood & agreed/SLS

## 2015-07-27 NOTE — Progress Notes (Signed)
Pre visit review using our clinic review tool, if applicable. No additional management support is needed unless otherwise documented below in the visit note/SLS  

## 2015-07-28 ENCOUNTER — Encounter: Payer: Self-pay | Admitting: Physician Assistant

## 2015-07-28 ENCOUNTER — Ambulatory Visit (INDEPENDENT_AMBULATORY_CARE_PROVIDER_SITE_OTHER): Payer: PRIVATE HEALTH INSURANCE | Admitting: Physician Assistant

## 2015-07-28 VITALS — BP 135/87 | HR 81 | Temp 98.3°F | Resp 16 | Ht 63.0 in | Wt 222.5 lb

## 2015-07-28 DIAGNOSIS — Z0000 Encounter for general adult medical examination without abnormal findings: Secondary | ICD-10-CM

## 2015-07-28 DIAGNOSIS — M5430 Sciatica, unspecified side: Secondary | ICD-10-CM | POA: Diagnosis not present

## 2015-07-28 DIAGNOSIS — Z23 Encounter for immunization: Secondary | ICD-10-CM

## 2015-07-28 DIAGNOSIS — G44209 Tension-type headache, unspecified, not intractable: Secondary | ICD-10-CM | POA: Diagnosis not present

## 2015-07-28 DIAGNOSIS — M545 Low back pain: Secondary | ICD-10-CM

## 2015-07-28 DIAGNOSIS — M5440 Lumbago with sciatica, unspecified side: Secondary | ICD-10-CM

## 2015-07-28 LAB — URINALYSIS, ROUTINE W REFLEX MICROSCOPIC
Bilirubin Urine: NEGATIVE
Ketones, ur: NEGATIVE
Leukocytes, UA: NEGATIVE
Nitrite: NEGATIVE
PH: 7 (ref 5.0–8.0)
SPECIFIC GRAVITY, URINE: 1.025 (ref 1.000–1.030)
TOTAL PROTEIN, URINE-UPE24: NEGATIVE
URINE GLUCOSE: NEGATIVE
UROBILINOGEN UA: 0.2 (ref 0.0–1.0)

## 2015-07-28 LAB — CBC WITH DIFFERENTIAL/PLATELET
BASOS ABS: 0 10*3/uL (ref 0.0–0.1)
Basophils Relative: 0.3 % (ref 0.0–3.0)
EOS ABS: 0.1 10*3/uL (ref 0.0–0.7)
Eosinophils Relative: 1.3 % (ref 0.0–5.0)
HCT: 41.5 % (ref 36.0–46.0)
Hemoglobin: 13.5 g/dL (ref 12.0–15.0)
LYMPHS ABS: 2.8 10*3/uL (ref 0.7–4.0)
Lymphocytes Relative: 36.4 % (ref 12.0–46.0)
MCHC: 32.5 g/dL (ref 30.0–36.0)
MCV: 85.3 fl (ref 78.0–100.0)
Monocytes Absolute: 0.4 10*3/uL (ref 0.1–1.0)
Monocytes Relative: 5.8 % (ref 3.0–12.0)
NEUTROS ABS: 4.3 10*3/uL (ref 1.4–7.7)
NEUTROS PCT: 56.2 % (ref 43.0–77.0)
PLATELETS: 206 10*3/uL (ref 150.0–400.0)
RBC: 4.87 Mil/uL (ref 3.87–5.11)
RDW: 13.5 % (ref 11.5–15.5)
WBC: 7.7 10*3/uL (ref 4.0–10.5)

## 2015-07-28 LAB — COMPREHENSIVE METABOLIC PANEL
ALT: 14 U/L (ref 0–35)
AST: 16 U/L (ref 0–37)
Albumin: 3.6 g/dL (ref 3.5–5.2)
Alkaline Phosphatase: 97 U/L (ref 39–117)
BILIRUBIN TOTAL: 0.4 mg/dL (ref 0.2–1.2)
BUN: 11 mg/dL (ref 6–23)
CO2: 26 meq/L (ref 19–32)
CREATININE: 0.69 mg/dL (ref 0.40–1.20)
Calcium: 9.2 mg/dL (ref 8.4–10.5)
Chloride: 108 mEq/L (ref 96–112)
GFR: 119.77 mL/min (ref >60.00)
Glucose, Bld: 87 mg/dL (ref 70–99)
Potassium: 4.7 mEq/L (ref 3.5–5.1)
Sodium: 139 mEq/L (ref 135–145)
Total Protein: 6.5 g/dL (ref 6.0–8.3)

## 2015-07-28 LAB — LIPID PANEL
CHOL/HDL RATIO: 4
Cholesterol: 156 mg/dL (ref 0–200)
HDL: 40.8 mg/dL (ref >39.00)
LDL CALC: 107 mg/dL — AB (ref 0–99)
NONHDL: 115.15
Triglycerides: 43 mg/dL (ref 0.0–149.0)
VLDL: 8.6 mg/dL (ref 0.0–40.0)

## 2015-07-28 LAB — TSH: TSH: 1.93 u[IU]/mL (ref 0.35–4.50)

## 2015-07-28 LAB — VITAMIN D 25 HYDROXY (VIT D DEFICIENCY, FRACTURES): VITD: 10.91 ng/mL — AB (ref 30.00–100.00)

## 2015-07-28 NOTE — Addendum Note (Signed)
Addended by: Rockwell Germany on: 07/28/2015 03:38 PM   Modules accepted: Orders

## 2015-07-28 NOTE — Addendum Note (Signed)
Addended by: Rockwell Germany on: 07/28/2015 02:03 PM   Modules accepted: Orders

## 2015-07-28 NOTE — Progress Notes (Signed)
Pre visit review using our clinic review tool, if applicable. No additional management support is needed unless otherwise documented below in the visit note/SLS  

## 2015-07-28 NOTE — Assessment & Plan Note (Signed)
Depression screen negative. Score of 1. Will monitor. Health Maintenance reviewed -- TDaP and flu shot given. Preventive schedule discussed and handout given in AVS. Will obtain fasting labs today.

## 2015-07-28 NOTE — Assessment & Plan Note (Signed)
Secondary to stress. Patient denies depression. Recommend Heating pad, Aspercreme and stretching exercises. Excedrin for acute headache. Follow-up if not improving.  Giving fatigue with this, will check lab panel

## 2015-07-28 NOTE — Assessment & Plan Note (Signed)
Resolved. Sciatica exercises discussed with patient. Handout given to help prevent recurrence.

## 2015-07-28 NOTE — Progress Notes (Signed)
Patient presents to clinic today for annual exam.  Patient is fasting for labs.  Chronic Issues: Patient endorses fatigue affecting daily activities. Endorses some mild anhedonia due to increased stress. Denies depressed mood. Is sleeping well.  Endorses some tension headaches recently coinciding with increased stressors.   Health Maintenance: Dental -- up-to-date Vision -- up-to-date Immunizations -- Will be getting flu shot and TDaP today. Mammogram -- Endorses due for repeat in May 2017. Followed by GYN. PAP -- Up-to-date.  Past Medical History  Diagnosis Date  . Chicken pox   . GERD (gastroesophageal reflux disease)   . Elevated blood pressure   . Drug abuse in remission   . UTI (lower urinary tract infection)   . Hypolipidemia   . STD (sexually transmitted disease)     gc over 76yrs ago  . 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 Wisdom tooth extraction    . Foot surgery      Right    Current Outpatient Prescriptions on File Prior to Visit  Medication Sig Dispense Refill  . meloxicam (MOBIC) 15 MG tablet Take 1 tablet (15 mg total) by mouth daily. Take each morning with food 20 tablet 0  . traMADol (ULTRAM) 50 MG tablet Take 1 tablet (50 mg total) by mouth every 8 (eight) hours as needed. 30 tablet 0   No current facility-administered medications on file prior to visit.    No Known Allergies  Family History  Problem Relation Age of Onset  . Alcoholism Father     Deceased  . Drug abuse Father   . Prostate cancer Father   . Hypertension Mother     Living  . Diabetes Mother   . Hyperlipidemia Mother   . Heart murmur Mother   . Alcoholism Maternal Grandfather   . Dementia Maternal Grandfather   . Arthritis Maternal Grandmother   . Breast cancer Maternal Grandmother 60  . Stroke Maternal Grandmother   .  Hypertension Maternal Grandmother   . Stroke Maternal Aunt   . Colon cancer Maternal Aunt   . Hyperlipidemia Maternal Aunt   . Alcoholism Maternal Uncle     x2  . Alcoholism Maternal Aunt   . Aneurysm Maternal Aunt   . Hypertension Maternal Aunt     x2  . Stroke Brother   . Drug abuse Brother   . Heart disease Brother   . Bone cancer Paternal Aunt     Social History   Social History  . Marital Status: Single    Spouse Name: N/A  . Number of Children: N/A  . Years of Education: N/A   Occupational History  . Not on file.   Social History Main Topics  . Smoking status: Current Every Day Smoker -- 0.50 packs/day    Types: Cigarettes  . Smokeless tobacco: Never Used     Comment: D/C Chantix.  Marland Kitchen Alcohol Use: No  . Drug Use: No  . Sexual Activity:    Partners: Male    Birth Control/ Protection: None     Comment: 1st intercourse 44 yo- More than 5 partners   Other Topics Concern  . Not on file   Social History Narrative   Review of Systems  Constitutional: Positive for malaise/fatigue. Negative for fever and weight loss.  HENT: Negative for ear discharge, ear pain, hearing loss and  tinnitus.   Eyes: Negative for blurred vision, double vision, photophobia and pain.  Respiratory: Negative for cough and shortness of breath.   Cardiovascular: Negative for chest pain and palpitations.  Gastrointestinal: Negative for heartburn, nausea, vomiting, abdominal pain, diarrhea, constipation, blood in stool and melena.  Genitourinary: Negative for dysuria, urgency, frequency, hematuria and flank pain.  Musculoskeletal: Negative for falls.  Neurological: Positive for headaches. Negative for dizziness and loss of consciousness.  Endo/Heme/Allergies: Negative for environmental allergies.  Psychiatric/Behavioral: Negative for depression, suicidal ideas, hallucinations and substance abuse. The patient is not nervous/anxious and does not have insomnia.    BP 135/87 mmHg  Pulse 81   Temp(Src) 98.3 F (36.8 C) (Oral)  Resp 16  Ht 5\' 3"  (1.6 m)  Wt 222 lb 8 oz (100.925 kg)  BMI 39.42 kg/m2  SpO2 100%  LMP 07/25/2015  Physical Exam  Constitutional: She is oriented to person, place, and time and well-developed, well-nourished, and in no distress.  HENT:  Head: Normocephalic and atraumatic.  Right Ear: Tympanic membrane, external ear and ear canal normal.  Left Ear: Tympanic membrane, external ear and ear canal normal.  Nose: Nose normal. No mucosal edema.  Mouth/Throat: Uvula is midline, oropharynx is clear and moist and mucous membranes are normal. No oropharyngeal exudate or posterior oropharyngeal erythema.  Eyes: Conjunctivae are normal. Pupils are equal, round, and reactive to light.  Neck: Neck supple. No thyromegaly present.  Cardiovascular: Normal rate, regular rhythm, normal heart sounds and intact distal pulses.   Pulmonary/Chest: Effort normal and breath sounds normal. No respiratory distress. She has no wheezes. She has no rales.  Abdominal: Soft. Bowel sounds are normal. She exhibits no distension and no mass. There is no tenderness. There is no rebound and no guarding.  Lymphadenopathy:    She has no cervical adenopathy.  Neurological: She is alert and oriented to person, place, and time. No cranial nerve deficit.  Skin: Skin is warm and dry. No rash noted.  Psychiatric: Affect normal.  Vitals reviewed.  No results found for this or any previous visit (from the past 2160 hour(s)).  Assessment/Plan: Back pain of lumbar region with sciatica Resolved. Sciatica exercises discussed with patient. Handout given to help prevent recurrence.  Tension headache Secondary to stress. Patient denies depression. Recommend Heating pad, Aspercreme and stretching exercises. Excedrin for acute headache. Follow-up if not improving.  Giving fatigue with this, will check lab panel  Visit for preventive health examination Depression screen negative. Score of 1. Will  monitor. Health Maintenance reviewed -- TDaP and flu shot given. Preventive schedule discussed and handout given in AVS. Will obtain fasting labs today.

## 2015-07-28 NOTE — Patient Instructions (Addendum)
Please go to the lab for blood work.  I will call you with your results. If your blood work is normal we will follow-up yearly for physicals.  We will treat any noted causes of your fatigue or other abnormal results.  Please apply Aspercreme to the neck to help with muscle tension. Excedrin will help when you have an acute headache. Please try to spend time daily to help relieve stress as this is causing your symptoms.  Please follow the exercises below to help prevent recurrence of back pain.  Preventive Care for Adults, Female A healthy lifestyle and preventive care can promote health and wellness. Preventive health guidelines for women include the following key practices.  A routine yearly physical is a good way to check with your health care provider about your health and preventive screening. It is a chance to share any concerns and updates on your health and to receive a thorough exam.  Visit your dentist for a routine exam and preventive care every 6 months. Brush your teeth twice a day and floss once a day. Good oral hygiene prevents tooth decay and gum disease.  The frequency of eye exams is based on your age, health, family medical history, use of contact lenses, and other factors. Follow your health care provider's recommendations for frequency of eye exams.  Eat a healthy diet. Foods like vegetables, fruits, whole grains, low-fat dairy products, and lean protein foods contain the nutrients you need without too many calories. Decrease your intake of foods high in solid fats, added sugars, and salt. Eat the right amount of calories for you.Get information about a proper diet from your health care provider, if necessary.  Regular physical exercise is one of the most important things you can do for your health. Most adults should get at least 150 minutes of moderate-intensity exercise (any activity that increases your heart rate and causes you to sweat) each week. In addition, most  adults need muscle-strengthening exercises on 2 or more days a week.  Maintain a healthy weight. The body mass index (BMI) is a screening tool to identify possible weight problems. It provides an estimate of body fat based on height and weight. Your health care provider can find your BMI and can help you achieve or maintain a healthy weight.For adults 20 years and older:  A BMI below 18.5 is considered underweight.  A BMI of 18.5 to 24.9 is normal.  A BMI of 25 to 29.9 is considered overweight.  A BMI of 30 and above is considered obese.  Maintain normal blood lipids and cholesterol levels by exercising and minimizing your intake of saturated fat. Eat a balanced diet with plenty of fruit and vegetables. Blood tests for lipids and cholesterol should begin at age 20 and be repeated every 5 years. If your lipid or cholesterol levels are high, you are over 50, or you are at high risk for heart disease, you may need your cholesterol levels checked more frequently.Ongoing high lipid and cholesterol levels should be treated with medicines if diet and exercise are not working.  If you smoke, find out from your health care provider how to quit. If you do not use tobacco, do not start.  Lung cancer screening is recommended for adults aged 55-80 years who are at high risk for developing lung cancer because of a history of smoking. A yearly low-dose CT scan of the lungs is recommended for people who have at least a 30-pack-year history of smoking and are a current   smoker or have quit within the past 15 years. A pack year of smoking is smoking an average of 1 pack of cigarettes a day for 1 year (for example: 1 pack a day for 30 years or 2 packs a day for 15 years). Yearly screening should continue until the smoker has stopped smoking for at least 15 years. Yearly screening should be stopped for people who develop a health problem that would prevent them from having lung cancer treatment.  If you are pregnant,  do not drink alcohol. If you are breastfeeding, be very cautious about drinking alcohol. If you are not pregnant and choose to drink alcohol, do not have more than 1 drink per day. One drink is considered to be 12 ounces (355 mL) of beer, 5 ounces (148 mL) of wine, or 1.5 ounces (44 mL) of liquor.  Avoid use of street drugs. Do not share needles with anyone. Ask for help if you need support or instructions about stopping the use of drugs.  High blood pressure causes heart disease and increases the risk of stroke. Your blood pressure should be checked at least every 1 to 2 years. Ongoing high blood pressure should be treated with medicines if weight loss and exercise do not work.  If you are 2-22 years old, ask your health care provider if you should take aspirin to prevent strokes.  Diabetes screening is done by taking a blood sample to check your blood glucose level after you have not eaten for a certain period of time (fasting). If you are not overweight and you do not have risk factors for diabetes, you should be screened once every 3 years starting at age 2. If you are overweight or obese and you are 27-57 years of age, you should be screened for diabetes every year as part of your cardiovascular risk assessment.  Breast cancer screening is essential preventive care for women. You should practice "breast self-awareness." This means understanding the normal appearance and feel of your breasts and may include breast self-examination. Any changes detected, no matter how small, should be reported to a health care provider. Women in their 53s and 30s should have a clinical breast exam (CBE) by a health care provider as part of a regular health exam every 1 to 3 years. After age 73, women should have a CBE every year. Starting at age 26, women should consider having a mammogram (breast X-ray test) every year. Women who have a family history of breast cancer should talk to their health care provider about  genetic screening. Women at a high risk of breast cancer should talk to their health care providers about having an MRI and a mammogram every year.  Breast cancer gene (BRCA)-related cancer risk assessment is recommended for women who have family members with BRCA-related cancers. BRCA-related cancers include breast, ovarian, tubal, and peritoneal cancers. Having family members with these cancers may be associated with an increased risk for harmful changes (mutations) in the breast cancer genes BRCA1 and BRCA2. Results of the assessment will determine the need for genetic counseling and BRCA1 and BRCA2 testing.  Your health care provider may recommend that you be screened regularly for cancer of the pelvic organs (ovaries, uterus, and vagina). This screening involves a pelvic examination, including checking for microscopic changes to the surface of your cervix (Pap test). You may be encouraged to have this screening done every 3 years, beginning at age 47.  For women ages 12-65, health care providers may recommend pelvic exams  and Pap testing every 3 years, or they may recommend the Pap and pelvic exam, combined with testing for human papilloma virus (HPV), every 5 years. Some types of HPV increase your risk of cervical cancer. Testing for HPV may also be done on women of any age with unclear Pap test results.  Other health care providers may not recommend any screening for nonpregnant women who are considered low risk for pelvic cancer and who do not have symptoms. Ask your health care provider if a screening pelvic exam is right for you.  If you have had past treatment for cervical cancer or a condition that could lead to cancer, you need Pap tests and screening for cancer for at least 20 years after your treatment. If Pap tests have been discontinued, your risk factors (such as having a new sexual partner) need to be reassessed to determine if screening should resume. Some women have medical problems  that increase the chance of getting cervical cancer. In these cases, your health care provider may recommend more frequent screening and Pap tests.  Colorectal cancer can be detected and often prevented. Most routine colorectal cancer screening begins at the age of 50 years and continues through age 75 years. However, your health care provider may recommend screening at an earlier age if you have risk factors for colon cancer. On a yearly basis, your health care provider may provide home test kits to check for hidden blood in the stool. Use of a small camera at the end of a tube, to directly examine the colon (sigmoidoscopy or colonoscopy), can detect the earliest forms of colorectal cancer. Talk to your health care provider about this at age 50, when routine screening begins. Direct exam of the colon should be repeated every 5-10 years through age 75 years, unless early forms of precancerous polyps or small growths are found.  People who are at an increased risk for hepatitis B should be screened for this virus. You are considered at high risk for hepatitis B if:  You were born in a country where hepatitis B occurs often. Talk with your health care provider about which countries are considered high risk.  Your parents were born in a high-risk country and you have not received a shot to protect against hepatitis B (hepatitis B vaccine).  You have HIV or AIDS.  You use needles to inject street drugs.  You live with, or have sex with, someone who has hepatitis B.  You get hemodialysis treatment.  You take certain medicines for conditions like cancer, organ transplantation, and autoimmune conditions.  Hepatitis C blood testing is recommended for all people born from 1945 through 1965 and any individual with known risks for hepatitis C.  Practice safe sex. Use condoms and avoid high-risk sexual practices to reduce the spread of sexually transmitted infections (STIs). STIs include gonorrhea,  chlamydia, syphilis, trichomonas, herpes, HPV, and human immunodeficiency virus (HIV). Herpes, HIV, and HPV are viral illnesses that have no cure. They can result in disability, cancer, and death.  You should be screened for sexually transmitted illnesses (STIs) including gonorrhea and chlamydia if:  You are sexually active and are younger than 24 years.  You are older than 24 years and your health care provider tells you that you are at risk for this type of infection.  Your sexual activity has changed since you were last screened and you are at an increased risk for chlamydia or gonorrhea. Ask your health care provider if you are at risk.    If you are at risk of being infected with HIV, it is recommended that you take a prescription medicine daily to prevent HIV infection. This is called preexposure prophylaxis (PrEP). You are considered at risk if:  You are sexually active and do not regularly use condoms or know the HIV status of your partner(s).  You take drugs by injection.  You are sexually active with a partner who has HIV.  Talk with your health care provider about whether you are at high risk of being infected with HIV. If you choose to begin PrEP, you should first be tested for HIV. You should then be tested every 3 months for as long as you are taking PrEP.  Osteoporosis is a disease in which the bones lose minerals and strength with aging. This can result in serious bone fractures or breaks. The risk of osteoporosis can be identified using a bone density scan. Women ages 65 years and over and women at risk for fractures or osteoporosis should discuss screening with their health care providers. Ask your health care provider whether you should take a calcium supplement or vitamin D to reduce the rate of osteoporosis.  Menopause can be associated with physical symptoms and risks. Hormone replacement therapy is available to decrease symptoms and risks. You should talk to your health  care provider about whether hormone replacement therapy is right for you.  Use sunscreen. Apply sunscreen liberally and repeatedly throughout the day. You should seek shade when your shadow is shorter than you. Protect yourself by wearing long sleeves, pants, a wide-brimmed hat, and sunglasses year round, whenever you are outdoors.  Once a month, do a whole body skin exam, using a mirror to look at the skin on your back. Tell your health care provider of new moles, moles that have irregular borders, moles that are larger than a pencil eraser, or moles that have changed in shape or color.  Stay current with required vaccines (immunizations).  Influenza vaccine. All adults should be immunized every year.  Tetanus, diphtheria, and acellular pertussis (Td, Tdap) vaccine. Pregnant women should receive 1 dose of Tdap vaccine during each pregnancy. The dose should be obtained regardless of the length of time since the last dose. Immunization is preferred during the 27th-36th week of gestation. An adult who has not previously received Tdap or who does not know her vaccine status should receive 1 dose of Tdap. This initial dose should be followed by tetanus and diphtheria toxoids (Td) booster doses every 10 years. Adults with an unknown or incomplete history of completing a 3-dose immunization series with Td-containing vaccines should begin or complete a primary immunization series including a Tdap dose. Adults should receive a Td booster every 10 years.  Varicella vaccine. An adult without evidence of immunity to varicella should receive 2 doses or a second dose if she has previously received 1 dose. Pregnant females who do not have evidence of immunity should receive the first dose after pregnancy. This first dose should be obtained before leaving the health care facility. The second dose should be obtained 4-8 weeks after the first dose.  Human papillomavirus (HPV) vaccine. Females aged 13-26 years who have  not received the vaccine previously should obtain the 3-dose series. The vaccine is not recommended for use in pregnant females. However, pregnancy testing is not needed before receiving a dose. If a female is found to be pregnant after receiving a dose, no treatment is needed. In that case, the remaining doses should be delayed until   after the pregnancy. Immunization is recommended for any person with an immunocompromised condition through the age of 60 years if she did not get any or all doses earlier. During the 3-dose series, the second dose should be obtained 4-8 weeks after the first dose. The third dose should be obtained 24 weeks after the first dose and 16 weeks after the second dose.  Zoster vaccine. One dose is recommended for adults aged 36 years or older unless certain conditions are present.  Measles, mumps, and rubella (MMR) vaccine. Adults born before [birth year] generally are considered immune to measles and mumps. Adults born in [birth year] or later should have 1 or more doses of MMR vaccine unless there is a contraindication to the vaccine or there is laboratory evidence of immunity to each of the three diseases. A routine second dose of MMR vaccine should be obtained at least 28 days after the first dose for students attending postsecondary schools, health care workers, or international travelers. People who received inactivated measles vaccine or an unknown type of measles vaccine during 1963-1967 should receive 2 doses of MMR vaccine. People who received inactivated mumps vaccine or an unknown type of mumps vaccine before 1979 and are at high risk for mumps infection should consider immunization with 2 doses of MMR vaccine. For females of childbearing age, rubella immunity should be determined. If there is no evidence of immunity, females who are not pregnant should be vaccinated. If there is no evidence of immunity, females who are pregnant should delay immunization until after pregnancy. Unvaccinated  health care workers born before [birth year] who lack laboratory evidence of measles, mumps, or rubella immunity or laboratory confirmation of disease should consider measles and mumps immunization with 2 doses of MMR vaccine or rubella immunization with 1 dose of MMR vaccine.  Pneumococcal 13-valent conjugate (PCV13) vaccine. When indicated, a person who is uncertain of his immunization history and has no record of immunization should receive the PCV13 vaccine. All adults 58 years of age and older should receive this vaccine. An adult aged 70 years or older who has certain medical conditions and has not been previously immunized should receive 1 dose of PCV13 vaccine. This PCV13 should be followed with a dose of pneumococcal polysaccharide (PPSV23) vaccine. Adults who are at high risk for pneumococcal disease should obtain the PPSV23 vaccine at least 8 weeks after the dose of PCV13 vaccine. Adults older than 42 years of age who have normal immune system function should obtain the PPSV23 vaccine dose at least 1 year after the dose of PCV13 vaccine.  Pneumococcal polysaccharide (PPSV23) vaccine. When PCV13 is also indicated, PCV13 should be obtained first. All adults aged 37 years and older should be immunized. An adult younger than age 27 years who has certain medical conditions should be immunized. Any person who resides in a nursing home or long-term care facility should be immunized. An adult smoker should be immunized. People with an immunocompromised condition and certain other conditions should receive both PCV13 and PPSV23 vaccines. People with human immunodeficiency virus (HIV) infection should be immunized as soon as possible after diagnosis. Immunization during chemotherapy or radiation therapy should be avoided. Routine use of PPSV23 vaccine is not recommended for American Indians, [place] Natives, or people younger than 65 years unless there are medical conditions that require PPSV23 vaccine. When indicated,  people who have unknown immunization and have no record of immunization should receive PPSV23 vaccine. One-time revaccination 5 years after the first dose of PPSV23 is recommended for people  aged 19-64 years who have chronic kidney failure, nephrotic syndrome, asplenia, or immunocompromised conditions. People who received 1-2 doses of PPSV23 before age 65 years should receive another dose of PPSV23 vaccine at age 65 years or later if at least 5 years have passed since the previous dose. Doses of PPSV23 are not needed for people immunized with PPSV23 at or after age 65 years.  Meningococcal vaccine. Adults with asplenia or persistent complement component deficiencies should receive 2 doses of quadrivalent meningococcal conjugate (MenACWY-D) vaccine. The doses should be obtained at least 2 months apart. Microbiologists working with certain meningococcal bacteria, military recruits, people at risk during an outbreak, and people who travel to or live in countries with a high rate of meningitis should be immunized. A first-year college student up through age 21 years who is living in a residence hall should receive a dose if she did not receive a dose on or after her 16th birthday. Adults who have certain high-risk conditions should receive one or more doses of vaccine.  Hepatitis A vaccine. Adults who wish to be protected from this disease, have certain high-risk conditions, work with hepatitis A-infected animals, work in hepatitis A research labs, or travel to or work in countries with a high rate of hepatitis A should be immunized. Adults who were previously unvaccinated and who anticipate close contact with an international adoptee during the first 60 days after arrival in the [place] from a country with a high rate of hepatitis A should be immunized.  Hepatitis B vaccine. Adults who wish to be protected from this disease, have certain high-risk conditions, may be exposed to blood or other infectious body  fluids, are household contacts or sex partners of hepatitis B positive people, are clients or workers in certain care facilities, or travel to or work in countries with a high rate of hepatitis B should be immunized.  Haemophilus influenzae type b (Hib) vaccine. A previously unvaccinated person with asplenia or sickle cell disease or having a scheduled splenectomy should receive 1 dose of Hib vaccine. Regardless of previous immunization, a recipient of a hematopoietic stem cell transplant should receive a 3-dose series 6-12 months after her successful transplant. Hib vaccine is not recommended for adults with HIV infection. Preventive Services / Frequency Ages 19 to 39 years  Blood pressure check.** / Every 3-5 years.  Lipid and cholesterol check.** / Every 5 years beginning at age 20.  Clinical breast exam.** / Every 3 years for women in their 20s and 30s.  BRCA-related cancer risk assessment.** / For women who have family members with a BRCA-related cancer (breast, ovarian, tubal, or peritoneal cancers).  Pap test.** / Every 2 years from ages 21 through 29. Every 3 years starting at age 30 through age 65 or 70 with a history of 3 consecutive normal Pap tests.  HPV screening.** / Every 3 years from ages 30 through ages 65 to 70 with a history of 3 consecutive normal Pap tests.  Hepatitis C blood test.** / For any individual with known risks for hepatitis C.  Skin self-exam. / Monthly.  Influenza vaccine. / Every year.  Tetanus, diphtheria, and acellular pertussis (Tdap, Td) vaccine.** / Consult your health care provider. Pregnant women should receive 1 dose of Tdap vaccine during each pregnancy. 1 dose of Td every 10 years.  Varicella vaccine.** / Consult your health care provider. Pregnant females who do not have evidence of immunity should receive the first dose after pregnancy.  HPV vaccine. / 3 doses   over 6 months, if 60 and younger. The vaccine is not recommended for use in pregnant  females. However, pregnancy testing is not needed before receiving a dose.  Measles, mumps, rubella (MMR) vaccine.** / You need at least 1 dose of MMR if you were born in 1957 or later. You may also need a 2nd dose. For females of childbearing age, rubella immunity should be determined. If there is no evidence of immunity, females who are not pregnant should be vaccinated. If there is no evidence of immunity, females who are pregnant should delay immunization until after pregnancy.  Pneumococcal 13-valent conjugate (PCV13) vaccine.** / Consult your health care provider.  Pneumococcal polysaccharide (PPSV23) vaccine.** / 1 to 2 doses if you smoke cigarettes or if you have certain conditions.  Meningococcal vaccine.** / 1 dose if you are age 66 to 85 years and a Market researcher living in a residence hall, or have one of several medical conditions, you need to get vaccinated against meningococcal disease. You may also need additional booster doses.  Hepatitis A vaccine.** / Consult your health care provider.  Hepatitis B vaccine.** / Consult your health care provider.  Haemophilus influenzae type b (Hib) vaccine.** / Consult your health care provider. Ages 74 to 45 years  Blood pressure check.** / Every year.  Lipid and cholesterol check.** / Every 5 years beginning at age 73 years.  Lung cancer screening. / Every year if you are aged 50-80 years and have a 30-pack-year history of smoking and currently smoke or have quit within the past 15 years. Yearly screening is stopped once you have quit smoking for at least 15 years or develop a health problem that would prevent you from having lung cancer treatment.  Clinical breast exam.** / Every year after age 11 years.  BRCA-related cancer risk assessment.** / For women who have family members with a BRCA-related cancer (breast, ovarian, tubal, or peritoneal cancers).  Mammogram.** / Every year beginning at age 20 years and continuing  for as long as you are in good health. Consult with your health care provider.  Pap test.** / Every 3 years starting at age 11 years through age 58 or 57 years with a history of 3 consecutive normal Pap tests.  HPV screening.** / Every 3 years from ages 35 years through ages 19 to 34 years with a history of 3 consecutive normal Pap tests.  Fecal occult blood test (FOBT) of stool. / Every year beginning at age 35 years and continuing until age 87 years. You may not need to do this test if you get a colonoscopy every 10 years.  Flexible sigmoidoscopy or colonoscopy.** / Every 5 years for a flexible sigmoidoscopy or every 10 years for a colonoscopy beginning at age 78 years and continuing until age 59 years.  Hepatitis C blood test.** / For all people born from [birth year] through 1963 and any individual with known risks for hepatitis C.  Skin self-exam. / Monthly.  Influenza vaccine. / Every year.  Tetanus, diphtheria, and acellular pertussis (Tdap/Td) vaccine.** / Consult your health care provider. Pregnant women should receive 1 dose of Tdap vaccine during each pregnancy. 1 dose of Td every 10 years.  Varicella vaccine.** / Consult your health care provider. Pregnant females who do not have evidence of immunity should receive the first dose after pregnancy.  Zoster vaccine.** / 1 dose for adults aged 60 years or older.  Measles, mumps, rubella (MMR) vaccine.** / You need at least 1 dose of MMR if  you were born in 1957 or later. You may also need a second dose. For females of childbearing age, rubella immunity should be determined. If there is no evidence of immunity, females who are not pregnant should be vaccinated. If there is no evidence of immunity, females who are pregnant should delay immunization until after pregnancy.  Pneumococcal 13-valent conjugate (PCV13) vaccine.** / Consult your health care provider.  Pneumococcal polysaccharide (PPSV23) vaccine.** / 1 to 2 doses if you smoke  cigarettes or if you have certain conditions.  Meningococcal vaccine.** / Consult your health care provider.  Hepatitis A vaccine.** / Consult your health care provider.  Hepatitis B vaccine.** / Consult your health care provider.  Haemophilus influenzae type b (Hib) vaccine.** / Consult your health care provider. Ages 65 years and over  Blood pressure check.** / Every year.  Lipid and cholesterol check.** / Every 5 years beginning at age 20 years.  Lung cancer screening. / Every year if you are aged 55-80 years and have a 30-pack-year history of smoking and currently smoke or have quit within the past 15 years. Yearly screening is stopped once you have quit smoking for at least 15 years or develop a health problem that would prevent you from having lung cancer treatment.  Clinical breast exam.** / Every year after age 40 years.  BRCA-related cancer risk assessment.** / For women who have family members with a BRCA-related cancer (breast, ovarian, tubal, or peritoneal cancers).  Mammogram.** / Every year beginning at age 40 years and continuing for as long as you are in good health. Consult with your health care provider.  Pap test.** / Every 3 years starting at age 30 years through age 65 or 70 years with 3 consecutive normal Pap tests. Testing can be stopped between 65 and 70 years with 3 consecutive normal Pap tests and no abnormal Pap or HPV tests in the past 10 years.  HPV screening.** / Every 3 years from ages 30 years through ages 65 or 70 years with a history of 3 consecutive normal Pap tests. Testing can be stopped between 65 and 70 years with 3 consecutive normal Pap tests and no abnormal Pap or HPV tests in the past 10 years.  Fecal occult blood test (FOBT) of stool. / Every year beginning at age 50 years and continuing until age 75 years. You may not need to do this test if you get a colonoscopy every 10 years.  Flexible sigmoidoscopy or colonoscopy.** / Every 5 years for a  flexible sigmoidoscopy or every 10 years for a colonoscopy beginning at age 50 years and continuing until age 75 years.  Hepatitis C blood test.** / For all people born from 1945 through 1965 and any individual with known risks for hepatitis C.  Osteoporosis screening.** / A one-time screening for women ages 65 years and over and women at risk for fractures or osteoporosis.  Skin self-exam. / Monthly.  Influenza vaccine. / Every year.  Tetanus, diphtheria, and acellular pertussis (Tdap/Td) vaccine.** / 1 dose of Td every 10 years.  Varicella vaccine.** / Consult your health care provider.  Zoster vaccine.** / 1 dose for adults aged 60 years or older.  Pneumococcal 13-valent conjugate (PCV13) vaccine.** / Consult your health care provider.  Pneumococcal polysaccharide (PPSV23) vaccine.** / 1 dose for all adults aged 65 years and older.  Meningococcal vaccine.** / Consult your health care provider.  Hepatitis A vaccine.** / Consult your health care provider.  Hepatitis B vaccine.** / Consult your health care   provider.  Haemophilus influenzae type b (Hib) vaccine.** / Consult your health care provider. ** Family history and personal history of risk and conditions may change your health care provider's recommendations.   This information is not intended to replace advice given to you by your health care provider. Make sure you discuss any questions you have with your health care provider.   Document Released: 10/31/2001 Document Revised: 09/25/2014 Document Reviewed: 01/30/2011 Elsevier Interactive Patient Education [employer].

## 2015-07-29 NOTE — Telephone Encounter (Signed)
Per notes pt was here and left because she had not been called back by 11:00am. Charge for no show?

## 2015-07-29 NOTE — Telephone Encounter (Signed)
No charge. 

## 2015-07-30 ENCOUNTER — Telehealth: Payer: Self-pay | Admitting: *Deleted

## 2015-07-30 MED ORDER — ERGOCALCIFEROL 1.25 MG (50000 UT) PO CAPS: 50000.0000 [IU] | ORAL_CAPSULE | ORAL | Status: DC

## 2015-07-30 NOTE — Telephone Encounter (Signed)
Patient informed, understood & agreed to Rx therapy, new Rx to pharmacy/SLS

## 2015-07-30 NOTE — Telephone Encounter (Signed)
-----   Message from Brunetta Jeans, PA-C sent at 07/30/2015  9:34 AM EST ----- Labs look great overall. Vitamin D is low. Recommend start Ergocalciferol 50,000 units once weekly for 10 weeks. Then start OTC 1000 unit Vitamin D supplement daily. This will help energy levels.

## 2015-08-24 ENCOUNTER — Telehealth: Payer: Self-pay | Admitting: Physician Assistant

## 2015-08-24 NOTE — Telephone Encounter (Signed)
error:[id number] ° °

## 2015-08-25 ENCOUNTER — Telehealth: Payer: Self-pay | Admitting: Physician Assistant

## 2015-08-25 ENCOUNTER — Ambulatory Visit: Payer: PRIVATE HEALTH INSURANCE | Admitting: Physician Assistant

## 2015-08-25 NOTE — Telephone Encounter (Signed)
No charge. But patient with no-shows and last minute cancellation. If this keeps happening will have to charge and she may be dismissed.

## 2015-08-25 NOTE — Telephone Encounter (Signed)
Pt was no show today for 10:00am appt, pt left VM 12/7 9:59am and called back at 10:15am stating she'll reschedule if she needs to, charge or no charge?

## 2015-10-06 ENCOUNTER — Other Ambulatory Visit: Payer: Self-pay | Admitting: Physician Assistant

## 2015-10-06 NOTE — Telephone Encounter (Signed)
Rx denied.  Called and spoke with the pt and informed her that she will not need the Vit D 50000 but once.  Informed her that Einar Pheasant wanted her to complete the Vit D 50000,then he would like for her to take Vit D OTC 1000 IU supplement daily.  Pt verbalized understanding.//AB/CMA

## 2015-10-11 ENCOUNTER — Telehealth: Payer: Self-pay | Admitting: Physician Assistant

## 2015-10-11 ENCOUNTER — Ambulatory Visit: Payer: PRIVATE HEALTH INSURANCE | Admitting: Physician Assistant

## 2015-10-13 ENCOUNTER — Encounter: Payer: Self-pay | Admitting: Physician Assistant

## 2015-10-13 NOTE — Telephone Encounter (Signed)
Charge. 

## 2015-10-13 NOTE — Telephone Encounter (Signed)
Pt was no show 10/11/15 7:00am for mychart appt, pt has not rescheduled, 2nd no show, charge or no charge?

## 2015-10-13 NOTE — Telephone Encounter (Signed)
Marked to charge and mailing letter °

## 2016-02-02 ENCOUNTER — Encounter: Payer: PRIVATE HEALTH INSURANCE | Admitting: Gynecology

## 2016-02-11 ENCOUNTER — Encounter (HOSPITAL_BASED_OUTPATIENT_CLINIC_OR_DEPARTMENT_OTHER): Payer: Self-pay | Admitting: *Deleted

## 2016-02-11 ENCOUNTER — Emergency Department (HOSPITAL_BASED_OUTPATIENT_CLINIC_OR_DEPARTMENT_OTHER)
Admission: EM | Admit: 2016-02-11 | Discharge: 2016-02-11 | Disposition: A | Discharge status: Home / Self Care | Payer: PRIVATE HEALTH INSURANCE | Attending: Emergency Medicine | Admitting: Emergency Medicine

## 2016-02-11 DIAGNOSIS — J029 Acute pharyngitis, unspecified: Secondary | ICD-10-CM

## 2016-02-11 DIAGNOSIS — F17210 Nicotine dependence, cigarettes, uncomplicated: Secondary | ICD-10-CM | POA: Diagnosis not present

## 2016-02-11 MED ORDER — ACETAMINOPHEN 500 MG PO TABS
1000.0000 mg | ORAL_TABLET | Freq: Once | ORAL | Status: AC
  Administered 2016-02-11: 1000 mg via ORAL
  Filled 2016-02-11: qty 2

## 2016-02-11 NOTE — ED Provider Notes (Signed)
CSN: [id number]     Arrival date & time 02/11/16  0908 History   First MD Initiated Contact with Patient 02/11/16 [phone]     Chief Complaint  Patient presents with  . Sore Throat     (Consider location/radiation/quality/duration/timing/severity/associated sxs/prior Treatment) HPI Complains of sore throat, with pain on swallowing onset 7 days ago. Pain is mild. No other associated symptoms. She's treated herself with amoxicillin 500 mg twice daily for the past 2 days from an old prescription. She denies fever admits to chills 2 nights ago. No other associated symptoms. Pain mild at present. Past Medical History  Diagnosis Date  . Chicken pox   . GERD (gastroesophageal reflux disease)   . Elevated blood pressure   . Drug abuse in remission   . UTI (lower urinary tract infection)   . Hypolipidemia   . STD (sexually transmitted disease)     gc over 38yrs ago  . 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 Wisdom tooth extraction    . Foot surgery      Right   Family History  Problem Relation Age of Onset  . Alcoholism Father     Deceased  . Drug abuse Father   . Prostate cancer Father   . Hypertension Mother     Living  . Diabetes Mother   . Hyperlipidemia Mother   . Heart murmur Mother   . Alcoholism Maternal Grandfather   . Dementia Maternal Grandfather   . Arthritis Maternal Grandmother   . Breast cancer Maternal Grandmother 60  . Stroke Maternal Grandmother   . Hypertension Maternal Grandmother   . Stroke Maternal Aunt   . Colon cancer Maternal Aunt   . Hyperlipidemia Maternal Aunt   . Alcoholism Maternal Uncle     x2  . Alcoholism Maternal Aunt   . Aneurysm Maternal Aunt   . Hypertension Maternal Aunt     x2  . Stroke Brother   . Drug abuse Brother   . Heart disease Brother   . Bone cancer Paternal Aunt    Social  History  Substance Use Topics  . Smoking status: Current Every Day Smoker -- 0.50 packs/day    Types: Cigarettes  . Smokeless tobacco: Never Used     Comment: D/C Chantix.  Marland Kitchen Alcohol Use: No   OB History    Gravida Para Term Preterm AB TAB SAB Ectopic Multiple Living   3 1 1  2 2    1      Review of Systems  Constitutional: Positive for chills.  HENT: Positive for sore throat.   Respiratory: Negative.   Genitourinary: Negative.       Allergies  Review of patient's allergies indicates no known allergies.  Home Medications   Prior to Admission medications   Medication Sig Start Date End Date Taking? Authorizing Provider  ergocalciferol (VITAMIN D2) 50000 UNITS capsule Take 1 capsule (50,000 Units total) by mouth once a week. 07/30/15   Brunetta Jeans, PA-C  meloxicam (MOBIC) 15 MG tablet Take 1 tablet (15 mg total) by mouth daily. Take each morning with food 07/21/15   Brunetta Jeans, PA-C  traMADol (ULTRAM) 50 MG tablet Take 1 tablet (50 mg total) by mouth every 8 (eight) hours as needed. 07/19/15   Brunetta Jeans, PA-C   BP 137/98 mmHg  Pulse 92  Temp(Src)  98.6 F (37 C) (Oral)  Resp 20  Ht 5\' 3"  (1.6 m)  Wt 224 lb (101.606 kg)  BMI 39.69 kg/m2  SpO2 99% Physical Exam  Constitutional: She appears well-developed and well-nourished.  HENT:  Head: Normocephalic and atraumatic.  Right Ear: External ear normal.  Left Ear: External ear normal.  Oropharynx reddened uvula midline no tonsillar exudate. Bilateral tympanic membranes normal  Eyes: Conjunctivae are normal. Pupils are equal, round, and reactive to light.  Neck: Neck supple. No tracheal deviation present. No thyromegaly present.  No pain on manipulation of laryngeal cartilage  Cardiovascular: Normal rate and regular rhythm.   No murmur heard. Pulmonary/Chest: Effort normal and breath sounds normal.  Abdominal: Soft. Bowel sounds are normal. She exhibits no distension. There is no tenderness.  Obese   Musculoskeletal: Normal range of motion. She exhibits no edema or tenderness.  Lymphadenopathy:    She has no cervical adenopathy.  Neurological: She is alert. Coordination normal.  Skin: Skin is warm and dry. No rash noted.  Psychiatric: She has a normal mood and affect.  Nursing note and vitals reviewed.   ED Course  Procedures (including critical care time) Labs Review Labs Reviewed - No data to display  Imaging Review No results found. I have personally reviewed and evaluated these images and lab results as part of my medical decision-making.   EKG Interpretation None      MDM  Will not test patient for strep as she is already on amoxicillin. Plan continue amoxicillin. Patient has 19 capsules left. 500 mg 3 times daily. Saltwater gargles. Tylenol for pain. Follow-up with PMD if not improved in a week. I counseled patient for 5 minutes on smoking cessation Diagnosis #1 pharyngitis #2 tobacco abuse Final diagnoses:  None        Orlie Dakin, MD 02/11/16 1000

## 2016-02-11 NOTE — Discharge Instructions (Signed)
Sore Throat Continue to take your amoxicillin 500 mg, 1 capsule 3 times daily until bottle is empty. Take Tylenol as directed for pain. Dissolve 1/4 teaspoon of salt in 8 ounces of warm water and gargle 4 times daily. See your primary care physician if not improved in a week. Return if concerned for any reason. A sore throat is a painful, burning, sore, or scratchy feeling of the throat. There may be pain or tenderness when swallowing or talking. You may have other symptoms with a sore throat. These include coughing, sneezing, fever, or a swollen neck. A sore throat is often the first sign of another sickness. These sicknesses may include a cold, flu, strep throat, or an infection called mono. Most sore throats go away without medical treatment.  HOME CARE   Only take medicine as told by your doctor.  Drink enough fluids to keep your pee (urine) clear or pale yellow.  Rest as needed.  Try using throat sprays, lozenges, or suck on hard candy (if older than 4 years or as told).  Sip warm liquids, such as broth, herbal tea, or warm water with honey. Try sucking on frozen ice pops or drinking cold liquids.  Rinse the mouth (gargle) with salt water. Mix 1 teaspoon salt with 8 ounces of water.  Do not smoke. Avoid being around others when they are smoking.  Put a humidifier in your bedroom at night to moisten the air. You can also turn on a hot shower and sit in the bathroom for 5-10 minutes. Be sure the bathroom door is closed. GET HELP RIGHT AWAY IF:   You have trouble breathing.  You cannot swallow fluids, soft foods, or your spit (saliva).  You have more puffiness (swelling) in the throat.  Your sore throat does not get better in 7 days.  You feel sick to your stomach (nauseous) and throw up (vomit).  You have a fever or lasting symptoms for more than 2-3 days.  You have a fever and your symptoms suddenly get worse. MAKE SURE YOU:   Understand these instructions.  Will watch your  condition.  Will get help right away if you are not doing well or get worse.   This information is not intended to replace advice given to you by your health care provider. Make sure you discuss any questions you have with your health care provider.   Document Released: 06/13/2008 Document Revised: 05/29/2012 Document Reviewed: 05/12/2012 Elsevier Interactive Patient Education [employer].

## 2016-02-11 NOTE — ED Notes (Signed)
Patient c/o sore throat for the past 5 days, she states she had some chills at night,  Took advil and she had some amoxicillin she started taking two days ago

## 2016-02-17 ENCOUNTER — Encounter: Payer: PRIVATE HEALTH INSURANCE | Admitting: Gynecology

## 2016-02-23 ENCOUNTER — Ambulatory Visit (INDEPENDENT_AMBULATORY_CARE_PROVIDER_SITE_OTHER): Payer: PRIVATE HEALTH INSURANCE | Admitting: Family Medicine

## 2016-02-23 VITALS — BP 104/82 | HR 79 | Temp 97.8°F | Resp 17 | Ht 63.0 in | Wt 226.0 lb

## 2016-02-23 DIAGNOSIS — E049 Nontoxic goiter, unspecified: Secondary | ICD-10-CM | POA: Diagnosis not present

## 2016-02-23 DIAGNOSIS — J309 Allergic rhinitis, unspecified: Secondary | ICD-10-CM

## 2016-02-23 DIAGNOSIS — J029 Acute pharyngitis, unspecified: Secondary | ICD-10-CM | POA: Diagnosis not present

## 2016-02-23 DIAGNOSIS — Z720 Tobacco use: Secondary | ICD-10-CM | POA: Diagnosis not present

## 2016-02-23 DIAGNOSIS — K219 Gastro-esophageal reflux disease without esophagitis: Secondary | ICD-10-CM

## 2016-02-23 LAB — POCT CBC
GRANULOCYTE PERCENT: 58.9 % (ref 37–80)
HEMATOCRIT: 40 % (ref 37.7–47.9)
HEMOGLOBIN: 13.7 g/dL (ref 12.2–16.2)
Lymph, poc: 3.6 — AB (ref 0.6–3.4)
MCH: 28.2 pg (ref 27–31.2)
MCHC: 34.2 g/dL (ref 31.8–35.4)
MCV: 82.6 fL (ref 80–97)
MID (cbc): 0.5 (ref 0–0.9)
MPV: 6.9 fL (ref 0–99.8)
POC GRANULOCYTE: 5.9 (ref 2–6.9)
POC LYMPH PERCENT: 36.1 %L (ref 10–50)
POC MID %: 5 % (ref 0–12)
Platelet Count, POC: 228 10*3/uL (ref 142–424)
RBC: 4.85 M/uL (ref 4.04–5.48)
RDW, POC: 12.3 %
WBC: 10 10*3/uL (ref 4.6–10.2)

## 2016-02-23 LAB — POCT RAPID STREP A (OFFICE): Rapid Strep A Screen: NEGATIVE

## 2016-02-23 MED ORDER — HYDROCODONE-ACETAMINOPHEN 7.5-325 MG/15ML PO SOLN: 5.0000 mL | Freq: Four times a day (QID) | ORAL | Status: DC | PRN

## 2016-02-23 MED ORDER — OMEPRAZOLE 40 MG PO CPDR: 40.0000 mg | DELAYED_RELEASE_CAPSULE | Freq: Every day | ORAL | Status: DC

## 2016-02-23 MED ORDER — FIRST-DUKES MOUTHWASH MT SUSP: 5.0000 mL | OROMUCOSAL | Status: DC | PRN

## 2016-02-23 NOTE — Patient Instructions (Addendum)
IF you received an x-ray today, you will receive an invoice from [facility]. Please contact [facility] at [phone] with questions or concerns regarding your invoice.   IF you received labwork today, you will receive an invoice from [employer]. Please contact [facility] at [phone] with questions or concerns regarding your invoice.   Our billing staff will not be able to assist you with questions regarding bills from these companies.  You will be contacted with the lab results as soon as they are available. The fastest way to get your results is to activate your My Chart account. Instructions are located on the last page of this paperwork. If you have not heard from [place] regarding the results in 2 weeks, please contact this office.    Pharyngitis Pharyngitis is redness, pain, and swelling (inflammation) of your pharynx.  CAUSES  Pharyngitis is usually caused by infection. Most of the time, these infections are from viruses (viral) and are part of a cold. However, sometimes pharyngitis is caused by bacteria (bacterial). Pharyngitis can also be caused by allergies. Viral pharyngitis may be spread from person to person by coughing, sneezing, and personal items or utensils (cups, forks, spoons, toothbrushes). Bacterial pharyngitis may be spread from person to person by more intimate contact, such as kissing.  SIGNS AND SYMPTOMS  Symptoms of pharyngitis include:   Sore throat.   Tiredness (fatigue).   Low-grade fever.   Headache.  Joint pain and muscle aches.  Skin rashes.  Swollen lymph nodes.  Plaque-like film on throat or tonsils (often seen with bacterial pharyngitis). DIAGNOSIS  Your health care provider will ask you questions about your illness and your symptoms. Your medical history, along with a physical exam, is often all that is needed to diagnose pharyngitis. Sometimes, a rapid strep test is done. Other lab tests may  also be done, depending on the suspected cause.  TREATMENT  Viral pharyngitis will usually get better in 3-4 days without the use of medicine. Bacterial pharyngitis is treated with medicines that kill germs (antibiotics).  HOME CARE INSTRUCTIONS   Drink enough water and fluids to keep your urine clear or pale yellow.   Only take over-the-counter or prescription medicines as directed by your health care provider:   If you are prescribed antibiotics, make sure you finish them even if you start to feel better.   Do not take aspirin.   Get lots of rest.   Gargle with 8 oz of salt water ( tsp of salt per 1 qt of water) as often as every 1-2 hours to soothe your throat.   Throat lozenges (if you are not at risk for choking) or sprays may be used to soothe your throat. SEEK MEDICAL CARE IF:   You have large, tender lumps in your neck.  You have a rash.  You cough up green, yellow-brown, or bloody spit. SEEK IMMEDIATE MEDICAL CARE IF:   Your neck becomes stiff.  You drool or are unable to swallow liquids.  You vomit or are unable to keep medicines or liquids down.  You have severe pain that does not go away with the use of recommended medicines.  You have trouble breathing (not caused by a stuffy nose). MAKE SURE YOU:   Understand these instructions.  Will watch your condition.  Will get help right away if you are not doing well or get worse.   This information is not intended to replace advice given to you by your health care  provider. Make sure you discuss any questions you have with your health care provider.   Document Released: 09/04/2005 Document Revised: 06/25/2013 Document Reviewed: 05/12/2013 Elsevier Interactive Patient Education 2016 [employer].  Goiter A goiter is an enlarged thyroid gland. The thyroid gland is located in the lower front of the neck. The gland produces hormones that regulate mood, body temperature, pulse rate, and digestion. Most  goiters are painless and are not a cause for serious concern. Goiters and conditions that cause goiters can be treated, if necessary. CAUSES Causes of this condition include:  Diseases that attack healthy cells in your body (autoimmune diseases) and affect your thyroid function, such as:  Graves disease. This causes too much thyroid hormone to be produced and it makes your thyroid overly active (hyperthyroidism).  Hashimoto disease. This type of inflammation of the thyroid (thyroiditis) causes too little thyroid hormone to be produced and it makes your thyroid not active enough (hypothyroidism).  Other conditions that cause thyroiditis.  Nodular goiter. This means that there are one or more small growths on your thyroid. These can create too much thyroid hormone.  Pregnancy.  Thyroid cancer. This is rare.  Certain medicines.  Radiation exposure.  Iodine deficiency. In some cases, the cause may not be known (idiopathic). RISK FACTORS This condition is more likely to develop in:  People who have a family history of goiter.  Women.  People who do not get enough iodine in their diet.  People who are older than 22.  People who smoke tobacco. SYMPTOMS Common symptoms of this condition include:  Swelling in the lower part of the neck. This swelling can range from a very small bump to a large lump.  A tight feeling in the throat.  A hoarse voice. Other symptoms include:   Coughing.  Wheezing.  Difficulty swallowing.  Difficulty breathing.  Bulging neck veins.  Dizziness. In some cases, there are no symptoms and thyroid hormone levels may be normal. When a goiter is the result of hyperthyroidism, symptoms may also include:  Nervousness or restlessness.  Inability to tolerate heat.  Unexplained weight loss.  Diarrhea.  Change in the texture of hair or skin.  Changes in heart beat, such as skipped beats, extra beats, or a rapid heart rate.  Loss of  menstruation.  Shaky hands.  Increased appetite.  Sleep problems. When a goiter is the result of hypothyroidism, symptoms may also include:  Feeling like you have no energy (lethargy).  Inability to tolerate cold.  Weight gain that is not explained by a change in diet or exercise habits.  Dry skin.  Coarse hair.  Menstrual irregularity.  Constipation.  Sadness or depression. DIAGNOSIS This condition may be diagnosed with a medical history and physical exam. You may also have other tests, including:  Blood tests to check thyroid function.  Imaging tests, such as:  Ultrasonography.  CT scan.  MRI.  Thyroid scan. You will be given a safe radioactive injection, then images will be taken of your thyroid.  Tissue sample (biopsy) of the goiter or any nodules. This checks to see if the goiter or nodules are cancerous. TREATMENT Treatment for this condition depends on the cause. Treatment may include:  Medicines to control your thyroid.  Anti-inflammatory or steroid medicines, if inflammation is the cause.  Iodine supplements or changes in diet, if the goiter is caused by iodine deficiency.  Radiation therapy.  Surgery to remove your thyroid. In some cases, no treatment is necessary, and your health care provider  will monitor your condition at regular checkups. HOME CARE INSTRUCTIONS  Follow recommendations from your health care provider for any changes to your diet.  Take over-the-counter and prescription medicines only as told by your health care provider.  Do not use any tobacco products, including cigarettes, chewing tobacco, or e-cigarettes. If you need help quitting, ask your health care provider.  Keep all follow-up appointments as told by your health care provider. This is important. SEEK MEDICAL CARE IF:  Your symptoms do not get better with treatment. SEEK IMMEDIATE MEDICAL CARE IF:  You develop sudden, unexplained confusion or other mental  changes.  You have nausea, vomiting, or diarrhea.  You develop a fever.  Your skin or the whites of your eyes appear yellow (jaundice).  You develop chest pain.  You have trouble breathing or swallowing.  You suddenly become very weak.  You experience extreme restlessness.   This information is not intended to replace advice given to you by your health care provider. Make sure you discuss any questions you have with your health care provider.   Document Released: 02/22/2010 Document Revised: 01/19/2015 Document Reviewed: 08/31/2014 Elsevier Interactive Patient Education [employer].

## 2016-02-23 NOTE — Progress Notes (Signed)
By signing my name below, I, Mesha Guinyard, attest that this documentation has been prepared under the direction and in the presence of Delman Cheadle, MD.  Electronically Signed: Verlee Monte, Medical Scribe. 02/23/2016. 5:40 PM.  Subjective:    Patient ID: Nicole Mueller, female    DOB: 03-03-1971, 45 y.o.   MRN: [id number]  HPI Chief Complaint  Patient presents with  . Sore Throat    HPI Comments: Nicole Mueller is a 45 y.o. female who presents to the [facility] complaining of sore throat onset past couple of weeks. Pt went to the urgent care in [place] to get treated for similiar symptoms- pt wasn't tested for strep. Pt self medicated with 500mg  amoxicillen TID for 4-5 day. Pt was told not to smoke. Pt had 2 cigarettes yesterday and none today because she's trying to quit. Pt has been doing salt mouth washes, and taking tylenol with little to no relief. Pt never had her tonsils taken out as a kid Pt hasn't used nasal sprays for relief.  Heartburn: Pt has heart burn and takes omeprazole to relieve her symptoms- has been off of omeprazole for a couple of weeks. Pt states she's having a hard time scheduling an appt.  PMHx: TSH nl 7 months previously. Pt was seen at the [facility] and they told her she could continue the [place] she had at home and to take tylenol. Pt has had multiple no shows at [facility] recently, and telephone notes document that she will likely be dismissed. Pt was checked for H. Pylori twice in 2012 and 2015- both negative.   FHx: Pt's aunt has thyroid problems.   Patient Active Problem List   Diagnosis Date Noted  . Visit for preventive health examination 07/28/2015  . Back pain of lumbar region with sciatica 07/19/2015  . Tension headache 02/26/2015  . Rhinitis, allergic 02/26/2015  . Morbid obesity ([place]) 10/17/2013  . Acid reflux 06/04/2013  . Smoking trying to quit 06/04/2013   Past Medical History  Diagnosis Date    . Chicken pox   . GERD (gastroesophageal reflux disease)   . Elevated blood pressure   . Drug abuse in remission   . UTI (lower urinary tract infection)   . Hypolipidemia   . STD (sexually transmitted disease)     gc over 65yrs ago  . 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 Wisdom tooth extraction    . Foot surgery      Right   No Known Allergies Prior to Admission medications   Medication Sig Start Date End Date Taking? Authorizing Provider  ergocalciferol (VITAMIN D2) 50000 UNITS capsule Take 1 capsule (50,000 Units total) by mouth once a week. 07/30/15  Yes Brunetta Jeans, PA-C  meloxicam (MOBIC) 15 MG tablet Take 1 tablet (15 mg total) by mouth daily. Take each morning with food Patient not taking: Reported on 02/23/2016 07/21/15   Brunetta Jeans, PA-C  traMADol (ULTRAM) 50 MG tablet Take 1 tablet (50 mg total) by mouth every 8 (eight) hours as needed. Patient not taking: Reported on 02/23/2016 07/19/15   Brunetta Jeans, PA-C   Social History   Social History  . Marital Status: Single    Spouse Name: N/A  . Number of Children: N/A  . Years of Education: N/A  Occupational History  . Not on file.   Social History Main Topics  . Smoking status: Current Every Day Smoker -- 0.50 packs/day for 27 years    Types: Cigarettes  . Smokeless tobacco: Never Used     Comment: D/C Chantix.  Marland Kitchen Alcohol Use: No  . Drug Use: No  . Sexual Activity:    Partners: Male    Birth Control/ Protection: None     Comment: 1st intercourse 45 yo- More than 5 partners   Other Topics Concern  . Not on file   Social History Narrative    Review of Systems  Constitutional: Positive for fever, chills and fatigue.  HENT: Positive for congestion, sinus pressure and sore throat.   Respiratory: Positive for cough (dry). Negative for wheezing.      Objective:  BP 104/82 mmHg  Pulse 79  Temp(Src) 97.8 F (36.6 C) (Oral)  Resp 17  Ht 5\' 3"  (1.6 m)  Wt 226 lb (102.513 kg)  BMI 40.04 kg/m2  SpO2 99%  Physical Exam  Constitutional: She appears well-developed and well-nourished. No distress.  HENT:  Head: Normocephalic and atraumatic.  Mouth/Throat: Oropharynx is clear and moist.  Soft cerumen bilaterally Nares with rhinitis  Eyes: Conjunctivae are normal.  Neck: Neck supple.  Question of some thyromegaly on the right  Cardiovascular: Normal rate, regular rhythm, S1 normal, S2 normal and normal heart sounds.  Exam reveals no gallop and no friction rub.   No murmur heard. Pulmonary/Chest: Effort normal and breath sounds normal. No respiratory distress. She has no wheezes. She has no rales.  Lymphadenopathy:  No lymph adenopathy  Neurological: She is alert.  Skin: Skin is warm and dry.  Psychiatric: She has a normal mood and affect. Her behavior is normal.  Nursing note and vitals reviewed.  Results for orders placed or performed in visit on 02/23/16  POCT rapid strep A  Result Value Ref Range   Rapid Strep A Screen Negative Negative  POCT CBC  Result Value Ref Range   WBC 10.0 4.6 - 10.2 K/uL   Lymph, poc 3.6 (A) 0.6 - 3.4   POC LYMPH PERCENT 36.1 10 - 50 %L   MID (cbc) 0.5 0 - 0.9   POC MID % 5.0 0 - 12 %M   POC Granulocyte 5.9 2 - 6.9   Granulocyte percent 58.9 37 - 80 %G   RBC 4.85 4.04 - 5.48 M/uL   Hemoglobin 13.7 12.2 - 16.2 g/dL   HCT, POC 40.0 37.7 - 47.9 %   MCV 82.6 80 - 97 fL   MCH, POC 28.2 27 - 31.2 pg   MCHC 34.2 31.8 - 35.4 g/dL   RDW, POC 12.3 %   Platelet Count, POC 228 142 - 424 K/uL   MPV 6.9 0 - 99.8 fL   Assessment & Plan:   1. Acute pharyngitis, unspecified etiology   2. Smoking trying to quit   3. Gastroesophageal reflux disease, esophagitis presence not specified   4. Allergic rhinitis, unspecified allergic rhinitis type   5. Enlarged thyroid gland     Orders Placed This  Encounter  Procedures  . Culture, Group A Strep    Order Specific Question:  Source    Answer:  oropharynx  . Thyroid Panel With TSH  . POCT rapid strep A  . POCT CBC    Meds ordered this encounter  Medications  . omeprazole (PRILOSEC) 40 MG capsule    Sig: Take 1 capsule (40 mg total) by mouth  daily. 30 minutes before dinner    Dispense:  30 capsule    Refill:  1  . Diphenhyd-Hydrocort-Nystatin (FIRST-DUKES MOUTHWASH) SUSP    Sig: Use as directed 5 mLs in the mouth or throat every 2 (two) hours as needed (sore throat).    Dispense:  237 mL    Refill:  0    1:1:1 ratio of diphenhydramine:hydrocortisone:nystatin please; ok to use other similar pharmacy formulary if less expensive  . HYDROcodone-acetaminophen (HYCET) 7.5-325 mg/15 ml solution    Sig: Take 5 mLs by mouth every 6 (six) hours as needed for moderate pain.    Dispense:  140 mL    Refill:  0    I personally performed the services described in this documentation, which was scribed in my presence. The recorded information has been reviewed and considered, and addended by me as needed.   Delman Cheadle, M.D.  [facility] [address] [place], [place] [address] [phone] phone [phone] fax  03/04/2016 9:18 PM

## 2016-02-24 LAB — THYROID PANEL WITH TSH
Free Thyroxine Index: 2.1 (ref 1.4–3.8)
T3 Uptake: 30 % (ref 22–35)
T4, Total: 7 ug/dL (ref 4.5–12.0)
TSH: 2.18 mIU/L

## 2016-02-25 ENCOUNTER — Ambulatory Visit: Payer: PRIVATE HEALTH INSURANCE | Admitting: Physician Assistant

## 2016-02-25 LAB — CULTURE, GROUP A STREP: Organism ID, Bacteria: NORMAL

## 2016-03-09 ENCOUNTER — Telehealth: Payer: Self-pay

## 2016-03-09 NOTE — Telephone Encounter (Signed)
Dr. Brigitte Pulse  Pt. Called about lab results please review when you can

## 2016-03-10 NOTE — Telephone Encounter (Signed)
Reviewed and sent to MyChart.  Her thyroid function is normal and she did not have any bacteria grow out of the throat swab that would make her ill so likely viral.

## 2016-03-30 ENCOUNTER — Encounter: Payer: PRIVATE HEALTH INSURANCE | Admitting: Gynecology

## 2016-05-02 ENCOUNTER — Ambulatory Visit (INDEPENDENT_AMBULATORY_CARE_PROVIDER_SITE_OTHER): Payer: PRIVATE HEALTH INSURANCE | Admitting: Women's Health

## 2016-05-02 ENCOUNTER — Encounter: Payer: Self-pay | Admitting: Women's Health

## 2016-05-02 VITALS — BP 110/72 | Ht 63.0 in | Wt 235.6 lb

## 2016-05-02 DIAGNOSIS — B977 Papillomavirus as the cause of diseases classified elsewhere: Secondary | ICD-10-CM

## 2016-05-02 DIAGNOSIS — Z13220 Encounter for screening for lipoid disorders: Secondary | ICD-10-CM | POA: Diagnosis not present

## 2016-05-02 DIAGNOSIS — Z01419 Encounter for gynecological examination (general) (routine) without abnormal findings: Secondary | ICD-10-CM

## 2016-05-02 LAB — CBC WITH DIFFERENTIAL/PLATELET
BASOS PCT: 0 %
Basophils Absolute: 0 cells/uL (ref 0–200)
EOS PCT: 1 %
Eosinophils Absolute: 107 cells/uL (ref 15–500)
HCT: 42.2 % (ref 35.0–45.0)
HEMOGLOBIN: 14.2 g/dL (ref 11.7–15.5)
Lymphs Abs: 4173 cells/uL — ABNORMAL HIGH (ref 850–3900)
MCH: 27.6 pg (ref 27.0–33.0)
MCHC: 33.6 g/dL (ref 32.0–36.0)
MCV: 82.1 fL (ref 80.0–100.0)
MONOS PCT: 6 %
MPV: 8.8 fL (ref 7.5–12.5)
Monocytes Absolute: 642 cells/uL (ref 200–950)
NEUTROS ABS: 5778 {cells}/uL (ref 1500–7800)
Neutrophils Relative %: 54 %
PLATELETS: 244 10*3/uL (ref 140–400)
RBC: 5.14 MIL/uL — AB (ref 3.80–5.10)
RDW: 13.7 % (ref 11.0–15.0)
WBC: 10.7 10*3/uL (ref 3.8–10.8)

## 2016-05-02 MED ORDER — IMIQUIMOD 5 % EX CREA
TOPICAL_CREAM | CUTANEOUS | 0 refills | Status: DC
Start: 2016-05-03 — End: 2016-09-20

## 2016-05-02 NOTE — Progress Notes (Signed)
AROUSH Mueller 1971/06/22 [id number]    History:    Presents for annual exam.  Monthly cycle using no contraception without pregnancy for many years. 2016 Pap was normal positive HR HPV -16, 18, 45. First abnormal Pap. Overdue for mammogram.  Past medical history, past surgical history, family history and social history were all reviewed and documented in the EPIC chart. Recently engaged planning marriage in the next year. Family history of diabetes and hypertension. Works at [employer] in [employer].  ROS:  A ROS was performed and pertinent positives and negatives are included.  Exam:  Vitals:   05/02/16 1556  BP: 110/72  Weight: 235 lb 9.6 oz (106.9 kg)  Height: 5\' 3"  (1.6 m)   Body mass index is 41.73 kg/m.   General appearance:  Normal Thyroid:  Symmetrical, normal in size, without palpable masses or nodularity. Respiratory  Auscultation:  Clear without wheezing or rhonchi Cardiovascular  Auscultation:  Regular rate, without rubs, murmurs or gallops  Edema/varicosities:  Not grossly evident Abdominal  Soft,nontender, without masses, guarding or rebound.  Liver/spleen:  No organomegaly noted  Hernia:  None appreciated  Skin  Inspection:  Grossly normal Questionable skin tag versus wart right inner thigh half centimeter   Breasts: Examined lying and sitting.     Right: Without masses, retractions, discharge or axillary adenopathy.     Left: Without masses, retractions, discharge or axillary adenopathy. Gentitourinary   Inguinal/mons:  Normal without inguinal adenopathy  External genitalia:  Normal  BUS/Urethra/Skene's glands:  Normal  Vagina:  Normal  Cervix:  Normal  Uterus:   normal in size, shape and contour.  Midline and mobile  Adnexa/parametria:     Rt: Without masses or tenderness.   Lt: Without masses or tenderness.  Anus and perineum: Normal  Digital rectal exam: Normal sphincter tone without palpated masses or tenderness  Assessment/Plan:  45 y.o.  S WF G3 P1 for annual exam.     2016 Normal Pap with positive HR HPV with -16, 18 and 45 Monthly cycle using no contraception History of 2 small fibroids Questionable HPV wart/skin tag right inner thigh Obesity History of low vitamin D  Plan: Options reviewed biopsy, will try [place] 5% Monday Wednesday Friday wash off in a.m. if no relief schedule appointment with Dr. Phineas Real for biopsy/removal.  SBE's, annual screening mammogram overdue instructed to schedule at the breast center. Reviewed importance of increasing exercise and decreasing calories for weight loss. CBC, vitamin D, CMP, lipid panel, UA, Pap with HR HPV typing.    Nicole Mueller [facility], 5:06 PM 05/02/2016

## 2016-05-02 NOTE — Patient Instructions (Signed)
Health Maintenance, Female Adopting a healthy lifestyle and getting preventive care can go a long way to promote health and wellness. Talk with your health care provider about what schedule of regular examinations is right for you. This is a good chance for you to check in with your provider about disease prevention and staying healthy. In between checkups, there are plenty of things you can do on your own. Experts have done a lot of research about which lifestyle changes and preventive measures are most likely to keep you healthy. Ask your health care provider for more information. WEIGHT AND DIET  Eat a healthy diet  Be sure to include plenty of vegetables, fruits, low-fat dairy products, and lean protein.  Do not eat a lot of foods high in solid fats, added sugars, or salt.  Get regular exercise. This is one of the most important things you can do for your health.  Most adults should exercise for at least 150 minutes each week. The exercise should increase your heart rate and make you sweat (moderate-intensity exercise).  Most adults should also do strengthening exercises at least twice a week. This is in addition to the moderate-intensity exercise.  Maintain a healthy weight  Body mass index (BMI) is a measurement that can be used to identify possible weight problems. It estimates body fat based on height and weight. Your health care provider can help determine your BMI and help you achieve or maintain a healthy weight.  For females 20 years of age and older:   A BMI below 18.5 is considered underweight.  A BMI of 18.5 to 24.9 is normal.  A BMI of 25 to 29.9 is considered overweight.  A BMI of 30 and above is considered obese.  Watch levels of cholesterol and blood lipids  You should start having your blood tested for lipids and cholesterol at 43 years of age, then have this test every 5 years.  You may need to have your cholesterol levels checked more often if:  Your lipid  or cholesterol levels are high.  You are older than 43 years of age.  You are at high risk for heart disease.  CANCER SCREENING   Lung Cancer  Lung cancer screening is recommended for adults 55-80 years old who are at high risk for lung cancer because of a history of smoking.  A yearly low-dose CT scan of the lungs is recommended for people who:  Currently smoke.  Have quit within the past 15 years.  Have at least a 30-pack-year history of smoking. A pack year is smoking an average of one pack of cigarettes a day for 1 year.  Yearly screening should continue until it has been 15 years since you quit.  Yearly screening should stop if you develop a health problem that would prevent you from having lung cancer treatment.  Breast Cancer  Practice breast self-awareness. This means understanding how your breasts normally appear and feel.  It also means doing regular breast self-exams. Let your health care provider know about any changes, no matter how small.  If you are in your 20s or 30s, you should have a clinical breast exam (CBE) by a health care provider every 1-3 years as part of a regular health exam.  If you are 40 or older, have a CBE every year. Also consider having a breast X-ray (mammogram) every year.  If you have a family history of breast cancer, talk to your health care provider about genetic screening.  If you   are at high risk for breast cancer, talk to your health care provider about having an MRI and a mammogram every year.  Breast cancer gene (BRCA) assessment is recommended for women who have family members with BRCA-related cancers. BRCA-related cancers include:  Breast.  Ovarian.  Tubal.  Peritoneal cancers.  Results of the assessment will determine the need for genetic counseling and BRCA1 and BRCA2 testing. Cervical Cancer Your health care provider may recommend that you be screened regularly for cancer of the pelvic organs (ovaries, uterus, and  vagina). This screening involves a pelvic examination, including checking for microscopic changes to the surface of your cervix (Pap test). You may be encouraged to have this screening done every 3 years, beginning at age 21.  For women ages 30-65, health care providers may recommend pelvic exams and Pap testing every 3 years, or they may recommend the Pap and pelvic exam, combined with testing for human papilloma virus (HPV), every 5 years. Some types of HPV increase your risk of cervical cancer. Testing for HPV may also be done on women of any age with unclear Pap test results.  Other health care providers may not recommend any screening for nonpregnant women who are considered low risk for pelvic cancer and who do not have symptoms. Ask your health care provider if a screening pelvic exam is right for you.  If you have had past treatment for cervical cancer or a condition that could lead to cancer, you need Pap tests and screening for cancer for at least 20 years after your treatment. If Pap tests have been discontinued, your risk factors (such as having a new sexual partner) need to be reassessed to determine if screening should resume. Some women have medical problems that increase the chance of getting cervical cancer. In these cases, your health care provider may recommend more frequent screening and Pap tests. Colorectal Cancer  This type of cancer can be detected and often prevented.  Routine colorectal cancer screening usually begins at 43 years of age and continues through 43 years of age.  Your health care provider may recommend screening at an earlier age if you have risk factors for colon cancer.  Your health care provider may also recommend using home test kits to check for hidden blood in the stool.  A small camera at the end of a tube can be used to examine your colon directly (sigmoidoscopy or colonoscopy). This is done to check for the earliest forms of colorectal  cancer.  Routine screening usually begins at age 50.  Direct examination of the colon should be repeated every 5-10 years through 43 years of age. However, you may need to be screened more often if early forms of precancerous polyps or small growths are found. Skin Cancer  Check your skin from head to toe regularly.  Tell your health care provider about any new moles or changes in moles, especially if there is a change in a mole's shape or color.  Also tell your health care provider if you have a mole that is larger than the size of a pencil eraser.  Always use sunscreen. Apply sunscreen liberally and repeatedly throughout the day.  Protect yourself by wearing long sleeves, pants, a wide-brimmed hat, and sunglasses whenever you are outside. HEART DISEASE, DIABETES, AND HIGH BLOOD PRESSURE   High blood pressure causes heart disease and increases the risk of stroke. High blood pressure is more likely to develop in:  People who have blood pressure in the high end   of the normal range (130-139/85-89 mm Hg).  People who are overweight or obese.  People who are African American.  If you are 38-23 years of age, have your blood pressure checked every 3-5 years. If you are 61 years of age or older, have your blood pressure checked every year. You should have your blood pressure measured twice--once when you are at a hospital or clinic, and once when you are not at a hospital or clinic. Record the average of the two measurements. To check your blood pressure when you are not at a hospital or clinic, you can use:  An automated blood pressure machine at a pharmacy.  A home blood pressure monitor.  If you are between 45 years and 39 years old, ask your health care provider if you should take aspirin to prevent strokes.  Have regular diabetes screenings. This involves taking a blood sample to check your fasting blood sugar level.  If you are at a normal weight and have a low risk for diabetes,  have this test once every three years after 43 years of age.  If you are overweight and have a high risk for diabetes, consider being tested at a younger age or more often. PREVENTING INFECTION  Hepatitis B  If you have a higher risk for hepatitis B, you should be screened for this virus. You are considered at high risk for hepatitis B if:  You were born in a country where hepatitis B is common. Ask your health care provider which countries are considered high risk.  Your parents were born in a high-risk country, and you have not been immunized against hepatitis B (hepatitis B vaccine).  You have HIV or AIDS.  You use needles to inject street drugs.  You live with someone who has hepatitis B.  You have had sex with someone who has hepatitis B.  You get hemodialysis treatment.  You take certain medicines for conditions, including cancer, organ transplantation, and autoimmune conditions. Hepatitis C  Blood testing is recommended for:  Everyone born from [birth year] through 1963.  Anyone with known risk factors for hepatitis C. Sexually transmitted infections (STIs)  You should be screened for sexually transmitted infections (STIs) including gonorrhea and chlamydia if:  You are sexually active and are younger than 43 years of age.  You are older than 43 years of age and your health care provider tells you that you are at risk for this type of infection.  Your sexual activity has changed since you were last screened and you are at an increased risk for chlamydia or gonorrhea. Ask your health care provider if you are at risk.  If you do not have HIV, but are at risk, it may be recommended that you take a prescription medicine daily to prevent HIV infection. This is called pre-exposure prophylaxis (PrEP). You are considered at risk if:  You are sexually active and do not regularly use condoms or know the HIV status of your partner(s).  You take drugs by injection.  You are sexually  active with a partner who has HIV. Talk with your health care provider about whether you are at high risk of being infected with HIV. If you choose to begin PrEP, you should first be tested for HIV. You should then be tested every 3 months for as long as you are taking PrEP.  PREGNANCY   If you are premenopausal and you may become pregnant, ask your health care provider about preconception counseling.  If you may  become pregnant, take 400 to 800 micrograms (mcg) of folic acid every day.  If you want to prevent pregnancy, talk to your health care provider about birth control (contraception). OSTEOPOROSIS AND MENOPAUSE   Osteoporosis is a disease in which the bones lose minerals and strength with aging. This can result in serious bone fractures. Your risk for osteoporosis can be identified using a bone density scan.  If you are 42 years of age or older, or if you are at risk for osteoporosis and fractures, ask your health care provider if you should be screened.  Ask your health care provider whether you should take a calcium or vitamin D supplement to lower your risk for osteoporosis.  Menopause may have certain physical symptoms and risks.  Hormone replacement therapy may reduce some of these symptoms and risks. Talk to your health care provider about whether hormone replacement therapy is right for you.  HOME CARE INSTRUCTIONS   Schedule regular health, dental, and eye exams.  Stay current with your immunizations.   Do not use any tobacco products including cigarettes, chewing tobacco, or electronic cigarettes.  If you are pregnant, do not drink alcohol.  If you are breastfeeding, limit how much and how often you drink alcohol.  Limit alcohol intake to no more than 1 drink per day for nonpregnant women. One drink equals 12 ounces of beer, 5 ounces of wine, or 1 ounces of hard liquor.  Do not use street drugs.  Do not share needles.  Ask your health care provider for help if  you need support or information about quitting drugs.  Tell your health care provider if you often feel depressed.  Tell your health care provider if you have ever been abused or do not feel safe at home.   This information is not intended to replace advice given to you by your health care provider. Make sure you discuss any questions you have with your health care provider.   Document Released: 03/20/2011 Document Revised: 09/25/2014 Document Reviewed: 08/06/2013 Elsevier Interactive Patient Education 2016 [place] Carbohydrate Counting for Diabetes Mellitus Carbohydrate counting is a method for keeping track of the amount of carbohydrates you eat. Eating carbohydrates naturally increases the level of sugar (glucose) in your blood, so it is important for you to know the amount that is okay for you to have in every meal. Carbohydrate counting helps keep the level of glucose in your blood within normal limits. The amount of carbohydrates allowed is different for every person. A dietitian can help you calculate the amount that is right for you. Once you know the amount of carbohydrates you can have, you can count the carbohydrates in the foods you want to eat. Carbohydrates are found in the following foods:  Grains, such as breads and cereals.  Dried beans and soy products.  Starchy vegetables, such as potatoes, peas, and corn.  Fruit and fruit juices.  Milk and yogurt.  Sweets and snack foods, such as cake, cookies, candy, chips, soft drinks, and fruit drinks. CARBOHYDRATE COUNTING There are two ways to count the carbohydrates in your food. You can use either of the methods or a combination of both. Reading the "Nutrition Facts" on [place] The "Nutrition Facts" is an area that is included on the labels of almost all packaged food and beverages in the [place]. It includes the serving size of that food or beverage and information about the nutrients in each serving of  the food, including the grams (g)  of carbohydrate per serving.  Decide the number of servings of this food or beverage that you will be able to eat or drink. Multiply that number of servings by the number of grams of carbohydrate that is listed on the label for that serving. The total will be the amount of carbohydrates you will be having when you eat or drink this food or beverage. Learning Standard Serving Sizes of Food When you eat food that is not packaged or does not include "Nutrition Facts" on the label, you need to measure the servings in order to count the amount of carbohydrates.A serving of most carbohydrate-rich foods contains about 15 g of carbohydrates. The following list includes serving sizes of carbohydrate-rich foods that provide 15 g ofcarbohydrate per serving:   1 slice of bread (1 oz) or 1 six-inch tortilla.    of a hamburger bun or English muffin.  4-6 crackers.   cup unsweetened dry cereal.    cup hot cereal.   cup rice or pasta.    cup mashed potatoes or  of a large baked potato.  1 cup fresh fruit or one small piece of fruit.    cup canned or frozen fruit or fruit juice.  1 cup milk.   cup plain fat-free yogurt or yogurt sweetened with artificial sweeteners.   cup cooked dried beans or starchy vegetable, such as peas, corn, or potatoes.  Decide the number of standard-size servings that you will eat. Multiply that number of servings by 15 (the grams of carbohydrates in that serving). For example, if you eat 2 cups of strawberries, you will have eaten 2 servings and 30 g of carbohydrates (2 servings x 15 g = 30 g). For foods such as soups and casseroles, in which more than one food is mixed in, you will need to count the carbohydrates in each food that is included. EXAMPLE OF CARBOHYDRATE COUNTING Sample Dinner  3 oz chicken breast.   cup of brown rice.   cup of corn.  1 cup milk.   1 cup strawberries with sugar-free whipped topping.   Carbohydrate Calculation Step 1: Identify the foods that contain carbohydrates:   Rice.   Corn.   Milk.   Strawberries. Step 2:Calculate the number of servings eaten of each:   2 servings of rice.   1 serving of corn.   1 serving of milk.   1 serving of strawberries. Step 3: Multiply each of those number of servings by 15 g:   2 servings of rice x 15 g = 30 g.   1 serving of corn x 15 g = 15 g.   1 serving of milk x 15 g = 15 g.   1 serving of strawberries x 15 g = 15 g. Step 4: Add together all of the amounts to find the total grams of carbohydrates eaten: 30 g + 15 g + 15 g + 15 g = 75 g.   This information is not intended to replace advice given to you by your health care provider. Make sure you discuss any questions you have with your health care provider.   Document Released: 09/04/2005 Document Revised: 09/25/2014 Document Reviewed: 08/01/2013 Elsevier Interactive Patient Education [employer].

## 2016-05-03 ENCOUNTER — Other Ambulatory Visit: Payer: Self-pay | Admitting: Women's Health

## 2016-05-03 LAB — LIPID PANEL
CHOL/HDL RATIO: 3.5 ratio (ref <=5.0)
Cholesterol: 191 mg/dL (ref 125–200)
HDL: 54 mg/dL (ref >=46)
LDL Cholesterol: 115 mg/dL (ref <130)
Triglycerides: 108 mg/dL (ref <150)
VLDL: 22 mg/dL (ref <30)

## 2016-05-03 LAB — COMPREHENSIVE METABOLIC PANEL
ALT: 16 U/L (ref 6–29)
AST: 20 U/L (ref 10–30)
Albumin: 3.9 g/dL (ref 3.6–5.1)
Alkaline Phosphatase: 111 U/L (ref 33–115)
BILIRUBIN TOTAL: 0.3 mg/dL (ref 0.2–1.2)
BUN: 14 mg/dL (ref 7–25)
CHLORIDE: 103 mmol/L (ref 98–110)
CO2: 26 mmol/L (ref 20–31)
CREATININE: 1.03 mg/dL (ref 0.50–1.10)
Calcium: 9.7 mg/dL (ref 8.6–10.2)
Glucose, Bld: 81 mg/dL (ref 65–99)
Potassium: 3.9 mmol/L (ref 3.5–5.3)
SODIUM: 137 mmol/L (ref 135–146)
TOTAL PROTEIN: 6.9 g/dL (ref 6.1–8.1)

## 2016-05-03 LAB — PAP, TP IMAGING W/ HPV RNA, RFLX HPV TYPE 16,18/45: HPV MRNA, HIGH RISK: NOT DETECTED

## 2016-05-03 LAB — VITAMIN D 25 HYDROXY (VIT D DEFICIENCY, FRACTURES): VIT D 25 HYDROXY: 21 ng/mL — AB (ref 30–100)

## 2016-05-03 MED ORDER — ERGOCALCIFEROL 1.25 MG (50000 UT) PO CAPS: 50000.0000 [IU] | ORAL_CAPSULE | ORAL | 0 refills | Status: DC

## 2016-05-08 ENCOUNTER — Other Ambulatory Visit: Payer: Self-pay | Admitting: Family Medicine

## 2016-07-26 ENCOUNTER — Encounter: Payer: Self-pay | Admitting: Physician Assistant

## 2016-07-26 ENCOUNTER — Ambulatory Visit (INDEPENDENT_AMBULATORY_CARE_PROVIDER_SITE_OTHER): Payer: PRIVATE HEALTH INSURANCE | Admitting: Physician Assistant

## 2016-07-26 VITALS — BP 118/86 | HR 93 | Temp 98.3°F | Resp 16 | Ht 63.0 in | Wt 243.0 lb

## 2016-07-26 DIAGNOSIS — Z1239 Encounter for other screening for malignant neoplasm of breast: Secondary | ICD-10-CM | POA: Insufficient documentation

## 2016-07-26 DIAGNOSIS — Z23 Encounter for immunization: Secondary | ICD-10-CM | POA: Diagnosis not present

## 2016-07-26 DIAGNOSIS — Z0000 Encounter for general adult medical examination without abnormal findings: Secondary | ICD-10-CM | POA: Diagnosis not present

## 2016-07-26 DIAGNOSIS — Z1231 Encounter for screening mammogram for malignant neoplasm of breast: Secondary | ICD-10-CM

## 2016-07-26 MED ORDER — OMEPRAZOLE 40 MG PO CPDR: 40.0000 mg | DELAYED_RELEASE_CAPSULE | Freq: Every day | ORAL | 5 refills | Status: DC

## 2016-07-26 NOTE — Patient Instructions (Signed)
Please go to the lab for blood work.   Our office will call you with your results unless you have chosen to receive results via MyChart.  If your blood work is normal we will follow-up each year for physicals and as scheduled for chronic medical problems.  If anything is abnormal we will treat accordingly and get you in for a follow-up.  You will be contacted to schedule a screening mammogram. Continue wearing supportive footwear and orthotic for plantar's fascitis. Try out the exercises below and see if this helps with the spells of symptoms.   Sciatica With Rehab The sciatic nerve runs from the back down the leg and is responsible for sensation and control of the muscles in the back (posterior) side of the thigh, lower leg, and foot. Sciatica is a condition that is characterized by inflammation of this nerve.  SYMPTOMS   Signs of nerve damage, including numbness and/or weakness along the posterior side of the lower extremity.  Pain in the back of the thigh that may also travel down the leg.  Pain that worsens when sitting for long periods of time.  Occasionally, pain in the back or buttock. CAUSES  Inflammation of the sciatic nerve is the cause of sciatica. The inflammation is due to something irritating the nerve. Common sources of irritation include:  Sitting for long periods of time.  Direct trauma to the nerve.  Arthritis of the spine.  Herniated or ruptured disk.  Slipping of the vertebrae (spondylolisthesis).  Pressure from soft tissues, such as muscles or ligament-like tissue (fascia). RISK INCREASES WITH:  Sports that place pressure or stress on the spine (football or weightlifting).  Poor strength and flexibility.  Failure to warm up properly before activity.  Family history of low back pain or disk disorders.  Previous back injury or surgery.  Poor body mechanics, especially when lifting, or poor posture. PREVENTION   Warm up and stretch properly before  activity.  Maintain physical fitness:  Strength, flexibility, and endurance.  Cardiovascular fitness.  Learn and use proper technique, especially with posture and lifting. When possible, have coach correct improper technique.  Avoid activities that place stress on the spine. PROGNOSIS If treated properly, then sciatica usually resolves within 6 weeks. However, occasionally surgery is necessary.  RELATED COMPLICATIONS   Permanent nerve damage, including pain, numbness, tingle, or weakness.  Chronic back pain.  Risks of surgery: infection, bleeding, nerve damage, or damage to surrounding tissues. TREATMENT Treatment initially involves resting from any activities that aggravate your symptoms. The use of ice and medication may help reduce pain and inflammation. The use of strengthening and stretching exercises may help reduce pain with activity. These exercises may be performed at home or with referral to a therapist. A therapist may recommend further treatments, such as transcutaneous electronic nerve stimulation (TENS) or ultrasound. Your caregiver may recommend corticosteroid injections to help reduce inflammation of the sciatic nerve. If symptoms persist despite non-surgical (conservative) treatment, then surgery may be recommended. MEDICATION  If pain medication is necessary, then nonsteroidal anti-inflammatory medications, such as aspirin and ibuprofen, or other minor pain relievers, such as acetaminophen, are often recommended.  Do not take pain medication for 7 days before surgery.  Prescription pain relievers may be given if deemed necessary by your caregiver. Use only as directed and only as much as you need.  Ointments applied to the skin may be helpful.  Corticosteroid injections may be given by your caregiver. These injections should be reserved for the most serious  cases, because they may only be given a certain number of times. HEAT AND COLD  Cold treatment (icing)  relieves pain and reduces inflammation. Cold treatment should be applied for 10 to 15 minutes every 2 to 3 hours for inflammation and pain and immediately after any activity that aggravates your symptoms. Use ice packs or massage the area with a piece of ice (ice massage).  Heat treatment may be used prior to performing the stretching and strengthening activities prescribed by your caregiver, physical therapist, or athletic trainer. Use a heat pack or soak the injury in warm water. SEEK MEDICAL CARE IF:  Treatment seems to offer no benefit, or the condition worsens.  Any medications produce adverse side effects. EXERCISES  RANGE OF MOTION (ROM) AND STRETCHING EXERCISES - Sciatica Most people with sciatic will find that their symptoms worsen with either excessive bending forward (flexion) or arching at the low back (extension). The exercises which will help resolve your symptoms will focus on the opposite motion. Your physician, physical therapist or athletic trainer will help you determine which exercises will be most helpful to resolve your low back pain. Do not complete any exercises without first consulting with your clinician. Discontinue any exercises which worsen your symptoms until you speak to your clinician. If you have pain, numbness or tingling which travels down into your buttocks, leg or foot, the goal of the therapy is for these symptoms to move closer to your back and eventually resolve. Occasionally, these leg symptoms will get better, but your low back pain may worsen; this is typically an indication of progress in your rehabilitation. Be certain to be very alert to any changes in your symptoms and the activities in which you participated in the 24 hours prior to the change. Sharing this information with your clinician will allow him/her to most efficiently treat your condition. These exercises may help you when beginning to rehabilitate your injury. Your symptoms may resolve with or  without further involvement from your physician, physical therapist or athletic trainer. While completing these exercises, remember:   Restoring tissue flexibility helps normal motion to return to the joints. This allows healthier, less painful movement and activity.  An effective stretch should be held for at least 30 seconds.  A stretch should never be painful. You should only feel a gentle lengthening or release in the stretched tissue. FLEXION RANGE OF MOTION AND STRETCHING EXERCISES: STRETCH - Flexion, Single Knee to Chest   Lie on a firm bed or floor with both legs extended in front of you.  Keeping one leg in contact with the floor, bring your opposite knee to your chest. Hold your leg in place by either grabbing behind your thigh or at your knee.  Pull until you feel a gentle stretch in your low back. Hold __________ seconds.  Slowly release your grasp and repeat the exercise with the opposite side. Repeat __________ times. Complete this exercise __________ times per day.  STRETCH - Flexion, Double Knee to Chest  Lie on a firm bed or floor with both legs extended in front of you.  Keeping one leg in contact with the floor, bring your opposite knee to your chest.  Tense your stomach muscles to support your back and then lift your other knee to your chest. Hold your legs in place by either grabbing behind your thighs or at your knees.  Pull both knees toward your chest until you feel a gentle stretch in your low back. Hold __________ seconds.  Tense  your stomach muscles and slowly return one leg at a time to the floor. Repeat __________ times. Complete this exercise __________ times per day.  STRETCH - Low Trunk Rotation   Lie on a firm bed or floor. Keeping your legs in front of you, bend your knees so they are both pointed toward the ceiling and your feet are flat on the floor.  Extend your arms out to the side. This will stabilize your upper body by keeping your shoulders  in contact with the floor.  Gently and slowly drop both knees together to one side until you feel a gentle stretch in your low back. Hold for __________ seconds.  Tense your stomach muscles to support your low back as you bring your knees back to the starting position. Repeat the exercise to the other side. Repeat __________ times. Complete this exercise __________ times per day  EXTENSION RANGE OF MOTION AND FLEXIBILITY EXERCISES: STRETCH - Extension, Prone on Elbows  Lie on your stomach on the floor, a bed will be too soft. Place your palms about shoulder width apart and at the height of your head.  Place your elbows under your shoulders. If this is too painful, stack pillows under your chest.  Allow your body to relax so that your hips drop lower and make contact more completely with the floor.  Hold this position for __________ seconds.  Slowly return to lying flat on the floor. Repeat __________ times. Complete this exercise __________ times per day.  RANGE OF MOTION - Extension, Prone Press Ups  Lie on your stomach on the floor, a bed will be too soft. Place your palms about shoulder width apart and at the height of your head.  Keeping your back as relaxed as possible, slowly straighten your elbows while keeping your hips on the floor. You may adjust the placement of your hands to maximize your comfort. As you gain motion, your hands will come more underneath your shoulders.  Hold this position __________ seconds.  Slowly return to lying flat on the floor. Repeat __________ times. Complete this exercise __________ times per day.  STRENGTHENING EXERCISES - Sciatica  These exercises may help you when beginning to rehabilitate your injury. These exercises should be done near your "sweet spot." This is the neutral, low-back arch, somewhere between fully rounded and fully arched, that is your least painful position. When performed in this safe range of motion, these exercises can be  used for people who have either a flexion or extension based injury. These exercises may resolve your symptoms with or without further involvement from your physician, physical therapist or athletic trainer. While completing these exercises, remember:   Muscles can gain both the endurance and the strength needed for everyday activities through controlled exercises.  Complete these exercises as instructed by your physician, physical therapist or athletic trainer. Progress with the resistance and repetition exercises only as your caregiver advises.  You may experience muscle soreness or fatigue, but the pain or discomfort you are trying to eliminate should never worsen during these exercises. If this pain does worsen, stop and make certain you are following the directions exactly. If the pain is still present after adjustments, discontinue the exercise until you can discuss the trouble with your clinician. STRENGTHENING - Deep Abdominals, Pelvic Tilt   Lie on a firm bed or floor. Keeping your legs in front of you, bend your knees so they are both pointed toward the ceiling and your feet are flat on the floor.  Tense your lower abdominal muscles to press your low back into the floor. This motion will rotate your pelvis so that your tail bone is scooping upwards rather than pointing at your feet or into the floor.  With a gentle tension and even breathing, hold this position for __________ seconds. Repeat __________ times. Complete this exercise __________ times per day.  STRENGTHENING - Abdominals, Crunches   Lie on a firm bed or floor. Keeping your legs in front of you, bend your knees so they are both pointed toward the ceiling and your feet are flat on the floor. Cross your arms over your chest.  Slightly tip your chin down without bending your neck.  Tense your abdominals and slowly lift your trunk high enough to just clear your shoulder blades. Lifting higher can put excessive stress on the  low back and does not further strengthen your abdominal muscles.  Control your return to the starting position. Repeat __________ times. Complete this exercise __________ times per day.  STRENGTHENING - Quadruped, Opposite UE/LE Lift  Assume a hands and knees position on a firm surface. Keep your hands under your shoulders and your knees under your hips. You may place padding under your knees for comfort.  Find your neutral spine and gently tense your abdominal muscles so that you can maintain this position. Your shoulders and hips should form a rectangle that is parallel with the floor and is not twisted.  Keeping your trunk steady, lift your right hand no higher than your shoulder and then your left leg no higher than your hip. Make sure you are not holding your breath. Hold this position __________ seconds.  Continuing to keep your abdominal muscles tense and your back steady, slowly return to your starting position. Repeat with the opposite arm and leg. Repeat __________ times. Complete this exercise __________ times per day.  STRENGTHENING - Abdominals and Quadriceps, Straight Leg Raise   Lie on a firm bed or floor with both legs extended in front of you.  Keeping one leg in contact with the floor, bend the other knee so that your foot can rest flat on the floor.  Find your neutral spine, and tense your abdominal muscles to maintain your spinal position throughout the exercise.  Slowly lift your straight leg off the floor about 6 inches for a count of 15, making sure to not hold your breath.  Still keeping your neutral spine, slowly lower your leg all the way to the floor. Repeat this exercise with each leg __________ times. Complete this exercise __________ times per day. POSTURE AND BODY MECHANICS CONSIDERATIONS - Sciatica Keeping correct posture when sitting, standing or completing your activities will reduce the stress put on different body tissues, allowing injured tissues a  chance to heal and limiting painful experiences. The following are general guidelines for improved posture. Your physician or physical therapist will provide you with any instructions specific to your needs. While reading these guidelines, remember:  The exercises prescribed by your provider will help you have the flexibility and strength to maintain correct postures.  The correct posture provides the optimal environment for your joints to work. All of your joints have less wear and tear when properly supported by a spine with good posture. This means you will experience a healthier, less painful body.  Correct posture must be practiced with all of your activities, especially prolonged sitting and standing. Correct posture is as important when doing repetitive low-stress activities (typing) as it is when doing a single  heavy-load activity (lifting). RESTING POSITIONS Consider which positions are most painful for you when choosing a resting position. If you have pain with flexion-based activities (sitting, bending, stooping, squatting), choose a position that allows you to rest in a less flexed posture. You would want to avoid curling into a fetal position on your side. If your pain worsens with extension-based activities (prolonged standing, working overhead), avoid resting in an extended position such as sleeping on your stomach. Most people will find more comfort when they rest with their spine in a more neutral position, neither too rounded nor too arched. Lying on a non-sagging bed on your side with a pillow between your knees, or on your back with a pillow under your knees will often provide some relief. Keep in mind, being in any one position for a prolonged period of time, no matter how correct your posture, can still lead to stiffness. PROPER SITTING POSTURE In order to minimize stress and discomfort on your spine, you must sit with correct posture Sitting with good posture should be effortless for  a healthy body. Returning to good posture is a gradual process. Many people can work toward this most comfortably by using various supports until they have the flexibility and strength to maintain this posture on their own. When sitting with proper posture, your ears will fall over your shoulders and your shoulders will fall over your hips. You should use the back of the chair to support your upper back. Your low back will be in a neutral position, just slightly arched. You may place a small pillow or folded towel at the base of your low back for support.  When working at a desk, create an environment that supports good, upright posture. Without extra support, muscles fatigue and lead to excessive strain on joints and other tissues. Keep these recommendations in mind: CHAIR:   A chair should be able to slide under your desk when your back makes contact with the back of the chair. This allows you to work closely.  The chair's height should allow your eyes to be level with the upper part of your monitor and your hands to be slightly lower than your elbows. BODY POSITION  Your feet should make contact with the floor. If this is not possible, use a foot rest.  Keep your ears over your shoulders. This will reduce stress on your neck and low back. INCORRECT SITTING POSTURES   If you are feeling tired and unable to assume a healthy sitting posture, do not slouch or slump. This puts excessive strain on your back tissues, causing more damage and pain. Healthier options include:  Using more support, like a lumbar pillow.  Switching tasks to something that requires you to be upright or walking.  Talking a brief walk.  Lying down to rest in a neutral-spine position. PROLONGED STANDING WHILE SLIGHTLY LEANING FORWARD  When completing a task that requires you to lean forward while standing in one place for a long time, place either foot up on a stationary 2-4 inch high object to help maintain the best  posture. When both feet are on the ground, the low back tends to lose its slight inward curve. If this curve flattens (or becomes too large), then the back and your other joints will experience too much stress, fatigue more quickly and can cause pain.  CORRECT STANDING POSTURES Proper standing posture should be assumed with all daily activities, even if they only take a few moments, like when brushing your  teeth. As in sitting, your ears should fall over your shoulders and your shoulders should fall over your hips. You should keep a slight tension in your abdominal muscles to brace your spine. Your tailbone should point down to the ground, not behind your body, resulting in an over-extended swayback posture.  INCORRECT STANDING POSTURES  Common incorrect standing postures include a forward head, locked knees and/or an excessive swayback. WALKING Walk with an upright posture. Your ears, shoulders and hips should all line-up. PROLONGED ACTIVITY IN A FLEXED POSITION When completing a task that requires you to bend forward at your waist or lean over a low surface, try to find a way to stabilize 3 of 4 of your limbs. You can place a hand or elbow on your thigh or rest a knee on the surface you are reaching across. This will provide you more stability so that your muscles do not fatigue as quickly. By keeping your knees relaxed, or slightly bent, you will also reduce stress across your low back. CORRECT LIFTING TECHNIQUES DO :   Assume a wide stance. This will provide you more stability and the opportunity to get as close as possible to the object which you are lifting.  Tense your abdominals to brace your spine; then bend at the knees and hips. Keeping your back locked in a neutral-spine position, lift using your leg muscles. Lift with your legs, keeping your back straight.  Test the weight of unknown objects before attempting to lift them.  Try to keep your elbows locked down at your sides in order get  the best strength from your shoulders when carrying an object.  Always ask for help when lifting heavy or awkward objects. INCORRECT LIFTING TECHNIQUES DO NOT:   Lock your knees when lifting, even if it is a small object.  Bend and twist. Pivot at your feet or move your feet when needing to change directions.  Assume that you cannot safely pick up a paperclip without proper posture.   This information is not intended to replace advice given to you by your health care provider. Make sure you discuss any questions you have with your health care provider.   Document Released: 09/04/2005 Document Revised: 01/19/2015 Document Reviewed: 12/17/2008 [employer] Interactive Patient Education [employer].

## 2016-07-26 NOTE — Progress Notes (Signed)
Patient presents to clinic today for annual exam.  Patient is fasting for labs. Body mass index is 43.05 kg/m. Patient endorses well-balanced overall. Is not involved in regular exercise at present.     Chronic Issues: GERD -- Taking Omeprazole PRN with good relief of symptoms. Denies breakthrough symptoms.  Health Maintenance: Immunizations -- Tetanus up-to-date. Will get flu shot today. Mammogram -- Followed by GYN. Overdue for screening mammogram. Would like order placed.  PAP -- up-to-date (01/27/2015 - high risk   Past Medical History:  Diagnosis Date  . Chicken pox   . Drug abuse in remission   . Elevated blood pressure   . GERD (gastroesophageal reflux disease)   . 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 Hypolipidemia   . STD (sexually transmitted disease)    gc over 22yr ago  . UTI (lower urinary tract infection)     Past Surgical History:  Procedure Laterality Date  . FOOT SURGERY     Right  . WISDOM TOOTH EXTRACTION      Current Outpatient Prescriptions on File Prior to Visit  Medication Sig Dispense Refill  . ergocalciferol (VITAMIN D2) 50000 units capsule Take 1 capsule (50,000 Units total) by mouth once a week. 12 capsule 0  . imiquimod (ALDARA) 5 % cream Apply topically 3 (three) times a week. 12 each 0   No current facility-administered medications on file prior to visit.     No Known Allergies  Family History  Problem Relation Age of Onset  . Alcoholism Father     Deceased  . Drug abuse Father   . Prostate cancer Father   . Hypertension Mother     Living  . Diabetes Mother   . Hyperlipidemia Mother   . Heart murmur Mother   . Alcoholism Maternal Grandfather   . Dementia Maternal Grandfather   . Arthritis Maternal Grandmother   . Breast cancer Maternal Grandmother 60  . Stroke Maternal Grandmother   . Hypertension Maternal Grandmother   .  Stroke Maternal Aunt   . Colon cancer Maternal Aunt   . Hyperlipidemia Maternal Aunt   . Alcoholism Maternal Uncle     x2  . Alcoholism Maternal Aunt   . Aneurysm Maternal Aunt   . Hypertension Maternal Aunt     x2  . Stroke Brother   . Drug abuse Brother   . Heart disease Brother   . Bone cancer Paternal Aunt     Social History   Social History  . Marital status: Single    Spouse name: N/A  . Number of children: N/A  . Years of education: N/A   Occupational History  . Not on file.   Social History Main Topics  . Smoking status: Former Smoker    Packs/day: 0.50    Years: 27.00    Types: Cigarettes  . Smokeless tobacco: Never Used     Comment: STOPPED SMOKING SINCE February 22, 2016  . Alcohol use No  . Drug use: No  . Sexual activity: Yes    Partners: Male    Birth control/ protection: None     Comment: 1st intercourse 45yo- More than 5 partners   Other Topics Concern  . Not on file   Social History Narrative  . No narrative on file    Review of Systems  Constitutional: Negative for fever and weight loss.  HENT: Negative for ear  discharge, ear pain, hearing loss and tinnitus.   Eyes: Negative for blurred vision, double vision, photophobia and pain.  Respiratory: Negative for cough and shortness of breath.   Cardiovascular: Negative for chest pain and palpitations.  Gastrointestinal: Negative for abdominal pain, blood in stool, constipation, diarrhea, heartburn, melena, nausea and vomiting.  Genitourinary: Negative for dysuria, flank pain, frequency, hematuria and urgency.  Musculoskeletal: Negative for falls.  Neurological: Negative for dizziness, loss of consciousness and headaches.  Endo/Heme/Allergies: Negative for environmental allergies.  Psychiatric/Behavioral: Negative for depression, hallucinations, substance abuse and suicidal ideas. The patient is not nervous/anxious and does not have insomnia.     BP 118/86 (BP Location: Right Arm, Patient Position:  Sitting, Cuff Size: Large)   Pulse 93   Temp 98.3 F (36.8 C) (Oral)   Resp 16   Ht '5\' 3"'$  (1.6 m)   Wt 243 lb (110.2 kg)   LMP 06/18/2016   SpO2 98%   BMI 43.05 kg/m   Physical Exam  Constitutional: She is oriented to person, place, and time and well-developed, well-nourished, and in no distress.  HENT:  Head: Normocephalic and atraumatic.  Right Ear: Tympanic membrane, external ear and ear canal normal.  Left Ear: Tympanic membrane, external ear and ear canal normal.  Nose: Nose normal. No mucosal edema.  Mouth/Throat: Uvula is midline, oropharynx is clear and moist and mucous membranes are normal. No oropharyngeal exudate or posterior oropharyngeal erythema.  Eyes: Conjunctivae are normal. Pupils are equal, round, and reactive to light.  Neck: Neck supple. No thyromegaly present.  Cardiovascular: Normal rate, regular rhythm, normal heart sounds and intact distal pulses.   Pulmonary/Chest: Effort normal and breath sounds normal. No respiratory distress. She has no wheezes. She has no rales.  Abdominal: Soft. Bowel sounds are normal. She exhibits no distension and no mass. There is no tenderness. There is no rebound and no guarding.  Lymphadenopathy:    She has no cervical adenopathy.  Neurological: She is alert and oriented to person, place, and time. No cranial nerve deficit.  Skin: Skin is warm and dry. No rash noted.  Psychiatric: Affect normal.  Vitals reviewed.   Recent Results (from the past 2160 hour(s))  CBC with Differential/Platelet     Status: Abnormal   Collection Time: 05/02/16  4:34 PM  Result Value Ref Range   WBC 10.7 3.8 - 10.8 K/uL   RBC 5.14 (H) 3.80 - 5.10 MIL/uL   Hemoglobin 14.2 11.7 - 15.5 g/dL   HCT 42.2 35.0 - 45.0 %   MCV 82.1 80.0 - 100.0 fL   MCH 27.6 27.0 - 33.0 pg   MCHC 33.6 32.0 - 36.0 g/dL   RDW 13.7 11.0 - 15.0 %   Platelets 244 140 - 400 K/uL   MPV 8.8 7.5 - 12.5 fL   Neutro Abs 5,778 1,500 - 7,800 cells/uL   Lymphs Abs 4,173 (H) 850  - 3,900 cells/uL   Monocytes Absolute 642 200 - 950 cells/uL   Eosinophils Absolute 107 15 - 500 cells/uL   Basophils Absolute 0 0 - 200 cells/uL   Neutrophils Relative % 54 %   Monocytes Relative 6 %   Eosinophils Relative 1 %   Basophils Relative 0 %   Smear Review Criteria for review not met     Comment: ** Please note change in unit of measure and reference range(s). **  Comprehensive metabolic panel     Status: None   Collection Time: 05/02/16  4:34 PM  Result Value Ref Range  Sodium 137 135 - 146 mmol/L   Potassium 3.9 3.5 - 5.3 mmol/L   Chloride 103 98 - 110 mmol/L   CO2 26 20 - 31 mmol/L   Glucose, Bld 81 65 - 99 mg/dL   BUN 14 7 - 25 mg/dL   Creat 1.03 0.50 - 1.10 mg/dL   Total Bilirubin 0.3 0.2 - 1.2 mg/dL   Alkaline Phosphatase 111 33 - 115 U/L   AST 20 10 - 30 U/L   ALT 16 6 - 29 U/L   Total Protein 6.9 6.1 - 8.1 g/dL   Albumin 3.9 3.6 - 5.1 g/dL   Calcium 9.7 8.6 - 10.2 mg/dL  VITAMIN D 25 Hydroxy (Vit-D Deficiency, Fractures)     Status: Abnormal   Collection Time: 05/02/16  4:34 PM  Result Value Ref Range   Vit D, 25-Hydroxy 21 (L) 30 - 100 ng/mL    Comment: Vitamin D Status           25-OH Vitamin D        Deficiency                <20 ng/mL        Insufficiency         20 - 29 ng/mL        Optimal             > or = 30 ng/mL   For 25-OH Vitamin D testing on patients on D2-supplementation and patients for whom quantitation of D2 and D3 fractions is required, the QuestAssureD 25-OH VIT D, (D2,D3), LC/MS/MS is recommended: order code [id number] (patients > 2 yrs).   Lipid panel     Status: None   Collection Time: 05/02/16  4:34 PM  Result Value Ref Range   Cholesterol 191 125 - 200 mg/dL   Triglycerides 108 <150 mg/dL   HDL 54 >=46 mg/dL   Total CHOL/HDL Ratio 3.5 <=5.0 Ratio   VLDL 22 <30 mg/dL   LDL Cholesterol 115 <130 mg/dL    Comment:   Total Cholesterol/HDL Ratio:CHD Risk                        Coronary Heart Disease Risk Table                                         Men       Women          1/2 Average Risk              3.4        3.3              Average Risk              5.0        4.4           2X Average Risk              9.6        7.1           3X Average Risk             23.4       11.0 Use the calculated Patient Ratio above and the CHD Risk table  to determine the patient's CHD Risk.   PAP,TP IMGw/HPV RNA,rflx MEQASTM19,62/22     Status: None  Collection Time: 05/02/16  4:38 PM  Result Value Ref Range   HPV mRNA, High Risk Not Detected     Comment: HIGH RISK HPV types (16,18,31,33,35,39,45,51,52,56,58,59,66,68) were not detected. Other HPV types which cause anogenital lesions may be present. The significance of the other types of HPV in malignant  processes has not been established.                  ** Normal Reference Range: Not Detected **      HPV High Risk testing performed using the APTIMA HPV mRNA Assay.      Specimen adequacy: SEE NOTE     Comment: SATISFACTORY.  Endocervical/transformation zone component present.   FINAL DIAGNOSIS: SEE NOTE     Comment: - NEGATIVE FOR INTRAEPITHELIAL LESIONS OR MALIGNANCY.    COMMENTS: SEE NOTE     Comment: LMP (Last Menstrual Period) of the patient is not given. Shift in flora suggestive of bacterial vaginosis.    Cytotechnologist: SEE NOTE     Comment: JLT, BS CT(ASCP) *  The Pap is a screening test for cervical cancer. It is not a  diagnostic test and is subject to false negative and false positive  results. It is most reliable when a satisfactory sample, regularly  obtained, is submitted with relevant clinical findings and history,  and when the Pap result is evaluated along with historic and current  clinical information.     Assessment/Plan: Breast cancer screening Overdue for repeat mammogram. Denies hx of abnormal. Order placed for screening mammogram  Visit for preventive health examination Depression screen negative. Health Maintenance reviewed -- Flu  shot updated today. PAP up-to-date. Mammogram order placed. Preventive schedule discussed and handout given in AVS. Will obtain fasting labs today.     Leeanne Rio, PA-C

## 2016-07-26 NOTE — Assessment & Plan Note (Signed)
Overdue for repeat mammogram. Denies hx of abnormal. Order placed for screening mammogram

## 2016-07-26 NOTE — Progress Notes (Signed)
Pre visit review using our clinic review tool, if applicable. No additional management support is needed unless otherwise documented below in the visit note/SLS  

## 2016-07-26 NOTE — Assessment & Plan Note (Signed)
Depression screen negative. Health Maintenance reviewed -- Flu shot updated today. PAP up-to-date. Mammogram order placed. Preventive schedule discussed and handout given in AVS. Will obtain fasting labs today.

## 2016-07-27 LAB — URINALYSIS, ROUTINE W REFLEX MICROSCOPIC
Bilirubin Urine: NEGATIVE
HGB URINE DIPSTICK: NEGATIVE
Ketones, ur: NEGATIVE
LEUKOCYTES UA: NEGATIVE
NITRITE: NEGATIVE
RBC / HPF: NONE SEEN (ref 0–?)
Specific Gravity, Urine: 1.025 (ref 1.000–1.030)
Total Protein, Urine: NEGATIVE
URINE GLUCOSE: NEGATIVE
Urobilinogen, UA: 0.2 (ref 0.0–1.0)
WBC, UA: NONE SEEN (ref 0–?)
pH: 6 (ref 5.0–8.0)

## 2016-07-27 LAB — CBC
HEMATOCRIT: 41.4 % (ref 36.0–46.0)
HEMOGLOBIN: 13.7 g/dL (ref 12.0–15.0)
MCHC: 33 g/dL (ref 30.0–36.0)
MCV: 82.7 fl (ref 78.0–100.0)
Platelets: 240 10*3/uL (ref 150.0–400.0)
RBC: 5.01 Mil/uL (ref 3.87–5.11)
RDW: 13.7 % (ref 11.5–15.5)
WBC: 13.1 10*3/uL — AB (ref 4.0–10.5)

## 2016-07-27 LAB — COMPREHENSIVE METABOLIC PANEL
ALBUMIN: 3.9 g/dL (ref 3.5–5.2)
ALK PHOS: 100 U/L (ref 39–117)
ALT: 17 U/L (ref 0–35)
AST: 21 U/L (ref 0–37)
BUN: 12 mg/dL (ref 6–23)
CALCIUM: 9.6 mg/dL (ref 8.4–10.5)
CO2: 28 mEq/L (ref 19–32)
Chloride: 104 mEq/L (ref 96–112)
Creatinine, Ser: 0.99 mg/dL (ref 0.40–1.20)
GFR: 78.59 mL/min (ref >60.00)
Glucose, Bld: 70 mg/dL (ref 70–99)
POTASSIUM: 3.7 meq/L (ref 3.5–5.1)
Sodium: 139 mEq/L (ref 135–145)
TOTAL PROTEIN: 7.4 g/dL (ref 6.0–8.3)
Total Bilirubin: 0.3 mg/dL (ref 0.2–1.2)

## 2016-07-27 LAB — LIPID PANEL
CHOLESTEROL: 190 mg/dL (ref 0–200)
HDL: 45.9 mg/dL (ref >39.00)
LDL Cholesterol: 114 mg/dL — ABNORMAL HIGH (ref 0–99)
NonHDL: 143.61
Total CHOL/HDL Ratio: 4
Triglycerides: 146 mg/dL (ref 0.0–149.0)
VLDL: 29.2 mg/dL (ref 0.0–40.0)

## 2016-07-27 LAB — TSH: TSH: 3.1 u[IU]/mL (ref 0.35–4.50)

## 2016-07-27 LAB — HEMOGLOBIN A1C: HEMOGLOBIN A1C: 5.9 % (ref 4.6–6.5)

## 2016-07-31 ENCOUNTER — Telehealth: Payer: Self-pay | Admitting: *Deleted

## 2016-07-31 DIAGNOSIS — D72828 Other elevated white blood cell count: Secondary | ICD-10-CM

## 2016-07-31 NOTE — Telephone Encounter (Signed)
-----   Message from Brunetta Jeans, PA-C sent at 07/27/2016  8:48 PM EST ----- Labs look great overall. WBC slightly elevated. Would recommend repeat CBC in 1 week to reassess giving she was having no constitutional symptoms or symptoms of infection at visit. Please verify that she has not had any recent steroid use.

## 2016-07-31 NOTE — Telephone Encounter (Signed)
Patient informed, understood & agreed; future lab order placed/SLS 11/13   Notes Recorded by Rockwell Germany, CMA on 07/28/2016 at 5:53 PM EST [facility] with contact name and number for return call RE: results and further provider instructions/SLS 11/10

## 2016-08-03 ENCOUNTER — Other Ambulatory Visit (INDEPENDENT_AMBULATORY_CARE_PROVIDER_SITE_OTHER): Payer: PRIVATE HEALTH INSURANCE

## 2016-08-03 DIAGNOSIS — D72828 Other elevated white blood cell count: Secondary | ICD-10-CM

## 2016-08-03 LAB — CBC
HEMATOCRIT: 42.6 % (ref 36.0–46.0)
HEMOGLOBIN: 13.8 g/dL (ref 12.0–15.0)
MCHC: 32.5 g/dL (ref 30.0–36.0)
MCV: 83.3 fl (ref 78.0–100.0)
PLATELETS: 247 10*3/uL (ref 150.0–400.0)
RBC: 5.11 Mil/uL (ref 3.87–5.11)
RDW: 13.6 % (ref 11.5–15.5)
WBC: 8.2 10*3/uL (ref 4.0–10.5)

## 2016-08-23 ENCOUNTER — Ambulatory Visit: Payer: PRIVATE HEALTH INSURANCE | Admitting: Physician Assistant

## 2016-08-24 ENCOUNTER — Telehealth: Payer: Self-pay | Admitting: Emergency Medicine

## 2016-08-24 NOTE — Telephone Encounter (Signed)
Prior authorization initiated thru Cover My Meds for Omeprazole DR 40 mg daily  Medication approved by [employer] Key: [id number] PA Case Id: [id number] 07/25/2016-08/24/2019 Pharmacy notified of approval

## 2016-09-20 ENCOUNTER — Ambulatory Visit (INDEPENDENT_AMBULATORY_CARE_PROVIDER_SITE_OTHER): Payer: PRIVATE HEALTH INSURANCE | Admitting: Physician Assistant

## 2016-09-20 ENCOUNTER — Encounter: Payer: Self-pay | Admitting: Physician Assistant

## 2016-09-20 DIAGNOSIS — K219 Gastro-esophageal reflux disease without esophagitis: Secondary | ICD-10-CM

## 2016-09-20 MED ORDER — FLUTICASONE PROPIONATE 50 MCG/ACT NA SUSP: 2.0000 | Freq: Every day | NASAL | 6 refills | Status: DC

## 2016-09-20 MED ORDER — PANTOPRAZOLE SODIUM 40 MG PO TBEC: 40.0000 mg | DELAYED_RELEASE_TABLET | Freq: Every day | ORAL | 1 refills | Status: DC

## 2016-09-20 NOTE — Patient Instructions (Signed)
Please stop the Prilosec. Start the Protonix as directed. Continue Zantac over the counter.  Please start Flonase daily as directed.  Avoid heavy foods and late-night eating.  You will be contacted by Gastroenterology for further assessment.

## 2016-09-20 NOTE — Progress Notes (Signed)
Pre visit review using our clinic review tool, if applicable. No additional management support is needed unless otherwise documented below in the visit note. 

## 2016-09-20 NOTE — Progress Notes (Signed)
Patient presents to clinic today c/o worsening heartburn especially at night. Notes scratchy throat on waking in the morning. Has recently stopped smoking. Denies nausea/vomiting, abdominal pain. Does note some issue with solid foods getting stuck occasionally. Is currently on Omeprazole daily.    Past Medical History:  Diagnosis Date  . Chicken pox   . Drug abuse in remission   . Elevated blood pressure   . GERD (gastroesophageal reflux disease)   . 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 Hypolipidemia   . STD (sexually transmitted disease)    gc over 6yrs ago  . UTI (lower urinary tract infection)     No current outpatient prescriptions on file prior to visit.   No current facility-administered medications on file prior to visit.     No Known Allergies  Family History  Problem Relation Age of Onset  . Alcoholism Father     Deceased  . Drug abuse Father   . Prostate cancer Father   . Hypertension Mother     Living  . Diabetes Mother   . Hyperlipidemia Mother   . Heart murmur Mother   . Alcoholism Maternal Grandfather   . Dementia Maternal Grandfather   . Arthritis Maternal Grandmother   . Breast cancer Maternal Grandmother 60  . Stroke Maternal Grandmother   . Hypertension Maternal Grandmother   . Stroke Maternal Aunt   . Colon cancer Maternal Aunt   . Hyperlipidemia Maternal Aunt   . Alcoholism Maternal Uncle     x2  . Alcoholism Maternal Aunt   . Aneurysm Maternal Aunt   . Hypertension Maternal Aunt     x2  . Stroke Brother   . Drug abuse Brother   . Heart disease Brother   . Bone cancer Paternal Aunt     Social History   Social History  . Marital status: Single    Spouse name: N/A  . Number of children: N/A  . Years of education: N/A   Social History Main Topics  . Smoking status: Former Smoker    Packs/day: 0.50    Years: 27.00    Types:  Cigarettes  . Smokeless tobacco: Never Used     Comment: STOPPED SMOKING SINCE February 22, 2016  . Alcohol use No  . Drug use: No  . Sexual activity: Yes    Partners: Male    Birth control/ protection: None     Comment: 1st intercourse 46 yo- More than 5 partners   Other Topics Concern  . None   Social History Narrative  . None    Review of Systems - See HPI.  All other ROS are negative.  BP 116/82   Pulse 90   Temp 98.2 F (36.8 C) (Oral)   Resp 16   Ht 5\' 3"  (1.6 m)   Wt 251 lb (113.9 kg)   SpO2 99%   BMI 44.46 kg/m   Physical Exam  Constitutional: She is oriented to person, place, and time and well-developed, well-nourished, and in no distress.  HENT:  Head: Normocephalic and atraumatic.  Right Ear: External ear normal.  Left Ear: External ear normal.  Nose: Nose normal.  Mouth/Throat: Oropharynx is clear and moist. No oropharyngeal exudate.  TM within normal limits bilaterally.  Eyes: Conjunctivae are normal.  Neck: Neck supple.  Cardiovascular: Normal rate, regular rhythm, normal heart sounds and intact distal pulses.   Pulmonary/Chest: Effort normal  and breath sounds normal. No respiratory distress. She has no wheezes. She has no rales. She exhibits no tenderness.  Abdominal: Soft. Bowel sounds are normal. She exhibits no distension. There is no tenderness.  Neurological: She is alert and oriented to person, place, and time.  Skin: Skin is warm and dry. No rash noted.  Psychiatric: Affect normal.  Vitals reviewed.  Recent Results (from the past 2160 hour(s))  CBC     Status: Abnormal   Collection Time: 07/26/16  3:22 PM  Result Value Ref Range   WBC 13.1 (H) 4.0 - 10.5 K/uL   RBC 5.01 3.87 - 5.11 Mil/uL   Platelets 240.0 150.0 - 400.0 K/uL   Hemoglobin 13.7 12.0 - 15.0 g/dL   HCT 41.4 36.0 - 46.0 %   MCV 82.7 78.0 - 100.0 fl   MCHC 33.0 30.0 - 36.0 g/dL   RDW 13.7 11.5 - 15.5 %  Comprehensive metabolic panel     Status: None   Collection Time: 07/26/16   3:22 PM  Result Value Ref Range   Sodium 139 135 - 145 mEq/L   Potassium 3.7 3.5 - 5.1 mEq/L   Chloride 104 96 - 112 mEq/L   CO2 28 19 - 32 mEq/L   Glucose, Bld 70 70 - 99 mg/dL   BUN 12 6 - 23 mg/dL   Creatinine, Ser 0.99 0.40 - 1.20 mg/dL   Total Bilirubin 0.3 0.2 - 1.2 mg/dL   Alkaline Phosphatase 100 39 - 117 U/L   AST 21 0 - 37 U/L   ALT 17 0 - 35 U/L   Total Protein 7.4 6.0 - 8.3 g/dL   Albumin 3.9 3.5 - 5.2 g/dL   Calcium 9.6 8.4 - 10.5 mg/dL   GFR 78.59 >60.00 mL/min  Lipid panel     Status: Abnormal   Collection Time: 07/26/16  3:22 PM  Result Value Ref Range   Cholesterol 190 0 - 200 mg/dL    Comment: ATP III Classification       Desirable:  < 200 mg/dL               Borderline High:  200 - 239 mg/dL          High:  > = 240 mg/dL   Triglycerides 146.0 0.0 - 149.0 mg/dL    Comment: Normal:  <150 mg/dLBorderline High:  150 - 199 mg/dL   HDL 45.90 >39.00 mg/dL   VLDL 29.2 0.0 - 40.0 mg/dL   LDL Cholesterol 114 (H) 0 - 99 mg/dL   Total CHOL/HDL Ratio 4     Comment:                Men          Women1/2 Average Risk     3.4          3.3Average Risk          5.0          4.42X Average Risk          9.6          7.13X Average Risk          15.0          11.0                       NonHDL 143.61     Comment: NOTE:  Non-HDL goal should be 30 mg/dL higher than patient's LDL goal (i.e. LDL goal of <  70 mg/dL, would have non-HDL goal of < 100 mg/dL)  TSH     Status: None   Collection Time: 07/26/16  3:22 PM  Result Value Ref Range   TSH 3.10 0.35 - 4.50 uIU/mL  Urinalysis, Routine w reflex microscopic (not at [facility])     Status: Abnormal   Collection Time: 07/26/16  3:22 PM  Result Value Ref Range   Color, Urine YELLOW Yellow;Lt. Yellow   APPearance Cloudy (A) Clear   Specific Gravity, Urine 1.025 1.000 - 1.030   pH 6.0 5.0 - 8.0   Total Protein, Urine NEGATIVE Negative   Urine Glucose NEGATIVE Negative   Ketones, ur NEGATIVE Negative   Bilirubin Urine NEGATIVE Negative   Hgb  urine dipstick NEGATIVE Negative   Urobilinogen, UA 0.2 0.0 - 1.0   Leukocytes, UA NEGATIVE Negative   Nitrite NEGATIVE Negative   WBC, UA none seen 0-2/hpf   RBC / HPF none seen 0-2/hpf  Hemoglobin A1c     Status: None   Collection Time: 07/26/16  3:22 PM  Result Value Ref Range   Hgb A1c MFr Bld 5.9 4.6 - 6.5 %    Comment: Glycemic Control Guidelines for People with Diabetes:Non Diabetic:  <6%Goal of Therapy: <7%Additional Action Suggested:  >8%   CBC     Status: None   Collection Time: 08/03/16  7:26 AM  Result Value Ref Range   WBC 8.2 4.0 - 10.5 K/uL   RBC 5.11 3.87 - 5.11 Mil/uL   Platelets 247.0 150.0 - 400.0 K/uL   Hemoglobin 13.8 12.0 - 15.0 g/dL   HCT 42.6 36.0 - 46.0 %   MCV 83.3 78.0 - 100.0 fl   MCHC 32.5 30.0 - 36.0 g/dL   RDW 13.6 11.5 - 15.5 %    Assessment/Plan: Acid reflux Will stop Prilosec. Start Protonix. Nightly Zantac. Start probiotic and restart GERD diet. Void late-night eating. Referral placed to GI for further assessment and EGD.     Leeanne Rio, PA-C

## 2016-10-01 NOTE — Assessment & Plan Note (Signed)
Will stop Prilosec. Start Protonix. Nightly Zantac. Start probiotic and restart GERD diet. Void late-night eating. Referral placed to GI for further assessment and EGD.

## 2016-10-01 NOTE — Assessment & Plan Note (Signed)
>>  ASSESSMENT AND PLAN FOR GERD WITH ESOPHAGITIS WRITTEN ON 10/01/2016  3:38 PM BY Waldon Merl, PA-C  Will stop Prilosec. Start Protonix. Nightly Zantac. Start probiotic and restart GERD diet. Void late-night eating. Referral placed to GI for further assessment and EGD.

## 2016-10-02 ENCOUNTER — Telehealth: Payer: Self-pay | Admitting: Physician Assistant

## 2016-10-02 NOTE — Telephone Encounter (Signed)
Pt needs to speak with someone about medical records being sent to Gastroenterology before they can schedule her at their office. She wanted to speak to a CMA about the specifics on why she needs this done and if it has been done.   Thanks.

## 2016-10-03 NOTE — Telephone Encounter (Signed)
Spoke with patient and she states that [facility] would not schedule her until they received the records from her previous GI provider. Her [facility] records were scanned into her chart. I have faxed the records to [facility] hoping they will schedule patient for an appointment.

## 2016-10-16 ENCOUNTER — Encounter: Payer: Self-pay | Admitting: Gastroenterology

## 2016-10-23 ENCOUNTER — Telehealth: Payer: Self-pay | Admitting: *Deleted

## 2016-10-23 DIAGNOSIS — J3489 Other specified disorders of nose and nasal sinuses: Secondary | ICD-10-CM

## 2016-10-23 NOTE — Telephone Encounter (Signed)
Referral placed - patient is aware and knows that she will get a call to schedule.   Instructions given for the nasal rinse, and patient stated verbal understanding.     Patient is aware to call if she needs anything else.

## 2016-10-23 NOTE — Telephone Encounter (Signed)
Patient was seen at the beginning of January, and was given a RX for [facility] - She has almost finished with that bottle and she still has a lot of drainage and she feels like she can't clear her throat and she sometimes gets a panic that she can't swallow.   She is asking if she needs to be seen or of there is something else that she can can do for this.   She is also interested in the possibility of seeing an ENT to see what is going on.

## 2016-10-23 NOTE — Telephone Encounter (Signed)
I would definitely recommend referral to ENT. Please feel free to place. Have her use saline nasal rinse to flush out nasal passages to help cut back on drainage.

## 2016-10-23 NOTE — Addendum Note (Signed)
Addended by: Katina Dung on: 10/23/2016 04:18 PM   Modules accepted: Orders

## 2016-11-01 ENCOUNTER — Ambulatory Visit: Payer: PRIVATE HEALTH INSURANCE

## 2016-11-14 ENCOUNTER — Ambulatory Visit: Payer: PRIVATE HEALTH INSURANCE

## 2016-11-21 ENCOUNTER — Ambulatory Visit: Payer: PRIVATE HEALTH INSURANCE | Admitting: Gastroenterology

## 2016-12-04 ENCOUNTER — Ambulatory Visit
Admission: RE | Admit: 2016-12-04 | Discharge: 2016-12-04 | Disposition: A | Discharge status: Home / Self Care | Payer: PRIVATE HEALTH INSURANCE | Source: Ambulatory Visit | Attending: Physician Assistant | Admitting: Physician Assistant

## 2016-12-04 DIAGNOSIS — Z1239 Encounter for other screening for malignant neoplasm of breast: Secondary | ICD-10-CM

## 2016-12-25 ENCOUNTER — Ambulatory Visit (INDEPENDENT_AMBULATORY_CARE_PROVIDER_SITE_OTHER): Payer: PRIVATE HEALTH INSURANCE | Admitting: Gastroenterology

## 2016-12-25 ENCOUNTER — Encounter: Payer: Self-pay | Admitting: Gastroenterology

## 2016-12-25 VITALS — BP 112/78 | HR 96 | Ht 61.75 in | Wt 241.5 lb

## 2016-12-25 DIAGNOSIS — R1314 Dysphagia, pharyngoesophageal phase: Secondary | ICD-10-CM | POA: Diagnosis not present

## 2016-12-25 DIAGNOSIS — K219 Gastro-esophageal reflux disease without esophagitis: Secondary | ICD-10-CM | POA: Diagnosis not present

## 2016-12-25 DIAGNOSIS — J3089 Other allergic rhinitis: Secondary | ICD-10-CM | POA: Diagnosis not present

## 2016-12-25 NOTE — Progress Notes (Signed)
[facility] Consult Note:  History: Nicole Mueller 12/25/2016  Referring physician: Leeanne Rio, PA-C  Reason for consult/chief complaint: Heartburn and Gastroesophageal Reflux (feel like food is getting stuck, having some regurgitation)   Subjective  HPI:  This is a 46 year old woman referred by primary care for GERD symptoms and dysphagia. For about the last year she has had intermittent pyrosis and regurgitation, especially when laying down. She will regurgitate undigested food. She also has a soreness on the right side of the throat and feels like food may get stuck there at times. She has had some intermittent sinus congestion and postnasal drip that did not get much better with nasal steroids. She has an appointment with ENT in the next couple of weeks to evaluate these allergic and throat symptoms. Symptoms improved a little after she stopped smoking last June. They also improved somewhat on a brief trial of PPI therapy. She does not have the sensation of food stuck in the chest nor early satiety, nausea or vomiting. She tends toward bloating and gas in her bowel habits what variable. She was seen at least once by Dr. Oletta Lamas at [facility] in 2016 for upper abdominal pain. It does not appear that any endoscopic procedures were performed, the patient does not recall having had any done.  ROS:  Review of Systems  Constitutional: Negative for appetite change and unexpected weight change.  HENT: Negative for mouth sores and voice change.   Eyes: Negative for pain and redness.  Respiratory: Negative for cough and shortness of breath.   Cardiovascular: Negative for chest pain and palpitations.  Genitourinary: Negative for dysuria and hematuria.  Musculoskeletal: Negative for arthralgias and myalgias.  Skin: Negative for pallor and rash.  Allergic/Immunologic: Positive for environmental allergies.  Neurological: Negative for weakness and headaches.    Hematological: Negative for adenopathy.   She is concerned of these digestive symptoms may be keeping her from losing weight.  Past Medical History: Past Medical History:  Diagnosis Date  . Chicken pox   . Drug abuse in remission   . Elevated blood pressure   . Fibroids   . GERD (gastroesophageal reflux disease)   . 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 Hypertension   . Hypolipidemia   . Ovarian cyst   . STD (sexually transmitted disease)    gc over 37yrs ago  . UTI (lower urinary tract infection)      Past Surgical History: Past Surgical History:  Procedure Laterality Date  . FOOT SURGERY Right    bone spur  . WISDOM TOOTH EXTRACTION       Family History: Family History  Problem Relation Age of Onset  . Alcoholism Father   . Drug abuse Father   . Prostate cancer Father   . Hypertension Mother   . Diabetes Mother   . Hyperlipidemia Mother   . Heart murmur Mother   . Alcoholism Maternal Grandfather   . Dementia Maternal Grandfather   . Arthritis Maternal Grandmother   . Breast cancer Maternal Grandmother 60  . Stroke Maternal Grandmother   . Hypertension Maternal Grandmother   . Stroke Maternal Aunt   . Colon cancer Maternal Aunt   . Hyperlipidemia Maternal Aunt   . Alcoholism Maternal Uncle     x2  . Alcoholism Maternal Aunt   . Aneurysm Maternal Aunt   . Hypertension Maternal Aunt  x2  . Stroke Brother   . Drug abuse Brother   . Heart disease Brother   . Bone cancer Paternal Aunt     Social History: Social History   Social History  . Marital status: Single    Spouse name: N/A  . Number of children: N/A  . Years of education: N/A   Social History Main Topics  . Smoking status: Former Smoker    Packs/day: 0.50    Years: 27.00    Types: Cigarettes    Quit date: 02/22/2016  . Smokeless tobacco: Never Used     Comment: STOPPED SMOKING SINCE  February 22, 2016  . Alcohol use No  . Drug use: No  . Sexual activity: Yes    Partners: Male    Birth control/ protection: None     Comment: 1st intercourse 46 yo- More than 5 partners   Other Topics Concern  . None   Social History Narrative  . None    Allergies: No Known Allergies  Outpatient Meds: Current Outpatient Prescriptions  Medication Sig Dispense Refill  . Calcium Carbonate (CALCIUM 600 PO) Take 1 tablet by mouth daily.    . Cholecalciferol (VITAMIN D3) 2000 units TABS Take 1 tablet by mouth daily.    . Omega-3 Fatty Acids (FISH OIL) 1200 MG CAPS Take 1 capsule by mouth daily.    . pantoprazole (PROTONIX) 40 MG tablet Take 1 tablet (40 mg total) by mouth daily. 30 tablet 1   No current facility-administered medications for this visit.       ___________________________________________________________________ Objective   Exam:  BP 112/78 (BP Location: Left Arm, Patient Position: Sitting, Cuff Size: Large)   Pulse 96   Ht 5' 1.75" (1.568 m) Comment: height measured without shoes  Wt 241 lb 8 oz (109.5 kg)   LMP 12/07/2016   BMI 44.53 kg/m    General: this is a(n) Obese and otherwise well-appearing woman , obvious sinus congestion which she says  began several days ago. No dysphonia  Eyes: sclera anicteric, no redness  ENT: oral mucosa moist without lesions, no cervical or supraclavicular lymphadenopathy, good dentition  CV: RRR without murmur, S1/S2, no JVD, no peripheral edema  Resp: clear to auscultation bilaterally, normal RR and effort noted  GI: soft, no tenderness, with active bowel sounds. No guarding or palpable organomegaly noted.  Skin; warm and dry, no rash or jaundice noted  Neuro: awake, alert and oriented x 3. Normal gross motor function and fluent speech  Labs:  CBC Latest Ref Rng & Units 08/03/2016 07/26/2016 05/02/2016  WBC 4.0 - 10.5 K/uL 8.2 13.1(H) 10.7  Hemoglobin 12.0 - 15.0 g/dL 13.8 13.7 14.2  Hematocrit 36.0 - 46.0 % 42.6  41.4 42.2  Platelets 150.0 - 400.0 K/uL 247.0 240.0 244   CMP Latest Ref Rng & Units 07/26/2016 05/02/2016 07/28/2015  Glucose 70 - 99 mg/dL 70 81 87  BUN 6 - 23 mg/dL 12 14 11   Creatinine 0.40 - 1.20 mg/dL 0.99 1.03 0.69  Sodium 135 - 145 mEq/L 139 137 139  Potassium 3.5 - 5.1 mEq/L 3.7 3.9 4.7  Chloride 96 - 112 mEq/L 104 103 108  CO2 19 - 32 mEq/L 28 26 26   Calcium 8.4 - 10.5 mg/dL 9.6 9.7 9.2  Total Protein 6.0 - 8.3 g/dL 7.4 6.9 6.5  Total Bilirubin 0.2 - 1.2 mg/dL 0.3 0.3 0.4  Alkaline Phos 39 - 117 U/L 100 111 97  AST 0 - 37 U/L 21 20 16   ALT 0 -  35 U/L 17 16 14      Radiologic Studies:  Unremarkable abdominal ultrasound June 2016  Assessment: Encounter Diagnoses  Name Primary?  . Gastroesophageal reflux disease, esophagitis presence not specified   . Pharyngoesophageal dysphagia   . Chronic allergic rhinitis due to other allergic trigger, unspecified seasonality Yes  . Morbid obesity ([place])     She has symptoms of GERD, it is not clear if they are the cause of her throat discomfort and dysphagia. There seems to be a probable element of allergic rhinitis/postnasal drip. I'm glad she will be seeing ENT soon to evaluate those symptoms and also hopefully perform a fiberoptic exam to evaluate the larynx because she has a former smoker.  Plan:  Advised weight loss through diet and regular exercise to improve her overall health and also controlled GERD symptoms.  Omeprazole 20 mg every other day  EGD. She is agreeable after discussion of the procedure and risks.  The benefits and risks of the planned procedure were described in detail with the patient or (when appropriate) their health care proxy.  Risks were outlined as including, but not limited to, bleeding, infection, perforation, adverse medication reaction leading to cardiac or pulmonary decompensation, or pancreatitis (if ERCP).  The limitation of incomplete mucosal visualization was also discussed.  No guarantees or  warranties were given.   Thank you for the courtesy of this consult.  Please call me with any questions or concerns.  Nelida Meuse III  CC: Leeanne Rio, PA-C

## 2016-12-25 NOTE — Patient Instructions (Signed)
If you are age 44 or older, your body mass index should be between 23-30. Your Body mass index is 44.53 kg/m. If this is out of the aforementioned range listed, please consider follow up with your Primary Care Provider.  If you are age 39 or younger, your body mass index should be between 19-25. Your Body mass index is 44.53 kg/m. If this is out of the aformentioned range listed, please consider follow up with your Primary Care Provider.   You have been scheduled for a colonoscopy. Please follow written instructions given to you at your visit today.  Please pick up your prep supplies at the pharmacy within the next 1-3 days. If you use inhalers (even only as needed), please bring them with you on the day of your procedure. Your physician has requested that you go to www.startemmi.com and enter the access code given to you at your visit today. This web site gives a general overview about your procedure. However, you should still follow specific instructions given to you by our office regarding your preparation for the procedure.  Thank you for choosing [facility]  Dr Wilfrid Lund III

## 2017-01-10 ENCOUNTER — Encounter: Payer: Self-pay | Admitting: Gastroenterology

## 2017-01-18 ENCOUNTER — Encounter: Payer: PRIVATE HEALTH INSURANCE | Admitting: Gastroenterology

## 2017-01-29 ENCOUNTER — Encounter: Payer: Self-pay | Admitting: Gastroenterology

## 2017-01-29 ENCOUNTER — Ambulatory Visit (AMBULATORY_SURGERY_CENTER): Payer: PRIVATE HEALTH INSURANCE | Admitting: Gastroenterology

## 2017-01-29 VITALS — BP 110/66 | HR 83 | Temp 96.6°F | Resp 16 | Ht 61.75 in | Wt 241.0 lb

## 2017-01-29 DIAGNOSIS — K219 Gastro-esophageal reflux disease without esophagitis: Secondary | ICD-10-CM | POA: Diagnosis present

## 2017-01-29 MED ORDER — SODIUM CHLORIDE 0.9 % IV SOLN: 500.0000 mL | INTRAVENOUS | Status: DC

## 2017-01-29 NOTE — Progress Notes (Signed)
No problems noted in the recovery room. maw 

## 2017-01-29 NOTE — Progress Notes (Signed)
Pt's states no medical or surgical changes since previsit or office visit. 

## 2017-01-29 NOTE — Patient Instructions (Signed)
YOU HAD AN ENDOSCOPIC PROCEDURE TODAY AT [facility]:   Refer to the procedure report that was given to you for any specific questions about what was found during the examination.  If the procedure report does not answer your questions, please call your gastroenterologist to clarify.  If you requested that your care partner not be given the details of your procedure findings, then the procedure report has been included in a sealed envelope for you to review at your convenience later.  YOU SHOULD EXPECT: Some feelings of bloating in the abdomen. Passage of more gas than usual.  Walking can help get rid of the air that was put into your GI tract during the procedure and reduce the bloating. If you had a lower endoscopy (such as a colonoscopy or flexible sigmoidoscopy) you may notice spotting of blood in your stool or on the toilet paper. If you underwent a bowel prep for your procedure, you may not have a normal bowel movement for a few days.  Please Note:  You might notice some irritation and congestion in your nose or some drainage.  This is from the oxygen used during your procedure.  There is no need for concern and it should clear up in a day or so.  SYMPTOMS TO REPORT IMMEDIATELY:    Following upper endoscopy (EGD)  Vomiting of blood or coffee ground material  New chest pain or pain under the shoulder blades  Painful or persistently difficult swallowing  New shortness of breath  Fever of 100F or higher  Black, tarry-looking stools  For urgent or emergent issues, a gastroenterologist can be reached at any hour by calling [phone].   DIET:  We do recommend a small meal at first, but then you may proceed to your regular diet.  Drink plenty of fluids but you should avoid alcoholic beverages for 24 hours.  ACTIVITY:  You should plan to take it easy for the rest of today and you should NOT DRIVE or use heavy machinery until tomorrow (because of the sedation medicines used  during the test).    FOLLOW UP: Our staff will call the number listed on your records the next business day following your procedure to check on you and address any questions or concerns that you may have regarding the information given to you following your procedure. If we do not reach you, we will leave a message.  However, if you are feeling well and you are not experiencing any problems, there is no need to return our call.  We will assume that you have returned to your regular daily activities without incident.  If any biopsies were taken you will be contacted by phone or by letter within the next 1-3 weeks.  Please call us at [phone] if you have not heard about the biopsies in 3 weeks.    SIGNATURES/CONFIDENTIALITY: You and/or your care partner have signed paperwork which will be entered into your electronic medical record.  These signatures attest to the fact that that the information above on your After Visit Summary has been reviewed and is understood.  Full responsibility of the confidentiality of this discharge information lies with you and/or your care-partner.    Handout was given to your care partner on GERD. You may resume your current medications today.  Decrease PANTOPRAZOLE to once daily. Follow anti-reflux regimen indefinitely.  Handout was given Follow up with ENT regarding your allergy and sinus symptoms. Please call if any questions or concerns.

## 2017-01-29 NOTE — Op Note (Signed)
[facility] Patient Name: Nicole Mueller Procedure Date: 01/29/2017 9:29 AM MRN: [id number] Endoscopist: [place]. Loletha Carrow , MD Age: 46 Referring MD:  Date of Birth: 10-02-1970 Gender: Female Account #: [id number] Procedure:                Upper GI endoscopy Indications:              Esophageal reflux symptoms that persist despite                            appropriate therapy, throat pain Medicines:                Monitored Anesthesia Care Procedure:                Pre-Anesthesia Assessment:                           - Prior to the procedure, a History and Physical                            was performed, and patient medications and                            allergies were reviewed. The patient's tolerance of                            previous anesthesia was also reviewed. The risks                            and benefits of the procedure and the sedation                            options and risks were discussed with the patient.                            All questions were answered, and informed consent                            was obtained. Prior Anticoagulants: The patient has                            taken no previous anticoagulant or antiplatelet                            agents. ASA Grade Assessment: II - A patient with                            mild systemic disease. After reviewing the risks                            and benefits, the patient was deemed in                            satisfactory condition to undergo the procedure.  After obtaining informed consent, the endoscope was                            passed under direct vision. Throughout the                            procedure, the patient's blood pressure, pulse, and                            oxygen saturations were monitored continuously. The                            Endoscope was introduced through the mouth, and                            advanced to the second  part of duodenum. The upper                            GI endoscopy was accomplished without difficulty.                            The patient tolerated the procedure well. Scope In: Scope Out: Findings:                 The larynx was normal.                           A patulous lower esophageal sphincter was found.                           The stomach was normal.                           The cardia and gastric fundus were normal on                            retroflexion.                           The examined duodenum was normal. Complications:            No immediate complications. Estimated Blood Loss:     Estimated blood loss: none. Impression:               - Normal larynx.                           - Patulous lower esophageal sphincter.                           - Normal stomach.                           - Normal examined duodenum.                           - No specimens collected. Recommendation:           -  Patient has a contact number available for                            emergencies. The signs and symptoms of potential                            delayed complications were discussed with the                            patient. Return to normal activities tomorrow.                            Written discharge instructions were provided to the                            patient.                           - Resume previous diet.                           - Continue present medications, except decrease                            pantoprazole to once daily.                           - Follow an antireflux regimen indefinitely. These                            things are as important as medicine in attempting                            to control reflux symptoms.                           Follow up with ENT regarding your allergy and sinus                            congestion symptoms. Roczen Waymire L. Loletha Carrow, MD 01/29/2017 9:50:36 AM This report has been signed electronically.

## 2017-01-29 NOTE — Progress Notes (Signed)
Patient awakening,vss,report to rn 

## 2017-01-29 NOTE — Progress Notes (Signed)
Pt c/o bottom lip being sore.  I explained about the bite guard.  Pt remembered it.  Pt's lip is not swollen and no break in skin.  I asked if she would like some ice to place on her lip.  Pt said yes.  Ice given. maw

## 2017-01-30 ENCOUNTER — Telehealth: Payer: Self-pay | Admitting: *Deleted

## 2017-01-30 NOTE — Telephone Encounter (Signed)
  Follow up Call-  Call back number 01/29/2017  Post procedure Call Back phone  # [phone]  Permission to leave phone message Yes  Some recent data might be hidden     Patient questions:  Do you have a fever, pain , or abdominal swelling? No. Pain Score  0 *  Have you tolerated food without any problems? Yes.    Have you been able to return to your normal activities? Yes.    Do you have any questions about your discharge instructions: Diet   No. Medications  No. Follow up visit  No.  Do you have questions or concerns about your Care? No.  Actions: * If pain score is 4 or above: No action needed, pain <4.

## 2017-05-03 ENCOUNTER — Encounter: Payer: PRIVATE HEALTH INSURANCE | Admitting: Women's Health

## 2017-06-04 ENCOUNTER — Encounter: Payer: PRIVATE HEALTH INSURANCE | Admitting: Women's Health

## 2017-07-17 ENCOUNTER — Encounter: Payer: PRIVATE HEALTH INSURANCE | Admitting: Women's Health

## 2017-08-15 ENCOUNTER — Ambulatory Visit: Payer: Self-pay

## 2017-08-15 NOTE — Telephone Encounter (Signed)
Pt. called to report a bump on underneath side of left breast.  Stated that she 1st noticed it last night with showering.  Reported the area is about dime-size and slightly raised.  Denied any open area or drainage.  Reported "there appears to be an area in the center that is starting to come to a head."  Stated it appears like a bruise.  Denied pain or itching.  Reported it is sore when she brushes across the bump.  Denied fever / chills.  Stated that she has recently worn bras that have been "irritating."   Declined an office appt. Tomorrow.  Stated she had an appt. on Monday, and questioned if this could wait?  Advised to apply moist  heat to area for 10 min. intervals, and to keep area clean and dry.  Encouraged to call back if her symptoms worsen.  Verb. Understanding; agreed with plan.            Reason for Disposition . [1] Small swelling or lump AND [2] unexplained AND [3] present < 1 week . [1] Swelling is painful to touch AND [2] no fever  Answer Assessment - Initial Assessment Questions 1. APPEARANCE of SWELLING: "What does it look like?" (e.g., lymph node, insect bite, mole)     Raised and about dime size; sore; appears to be coming to a head; a whitehead forming 2. SIZE: "How large is the swelling?" (inches, cm or compare to coins)     Dime size 3. LOCATION: "Where is the swelling located?"     Underneath side of left breast 4. ONSET: "When did the swelling start?"     Last night about 12:30 AM 5. PAIN: "Is it painful?" If so, ask: "How much?"     No; only sore 6. ITCH: "Does it itch?" If so, ask: "How much?"     no 7. CAUSE: "What do you think caused the swelling?"     Irritation from a bra 8. OTHER SYMPTOMS: "Do you have any other symptoms?" (e.g., fever)     Appears like a little bruised area; denied any fever / chills  Protocols used: SKIN LUMP OR LOCALIZED SWELLING-A-AH

## 2017-08-20 ENCOUNTER — Other Ambulatory Visit: Payer: PRIVATE HEALTH INSURANCE

## 2017-08-20 ENCOUNTER — Ambulatory Visit (INDEPENDENT_AMBULATORY_CARE_PROVIDER_SITE_OTHER): Payer: PRIVATE HEALTH INSURANCE | Admitting: Physician Assistant

## 2017-08-20 ENCOUNTER — Encounter: Payer: Self-pay | Admitting: Physician Assistant

## 2017-08-20 VITALS — BP 130/80 | HR 69 | Temp 98.2°F | Resp 14 | Ht 63.0 in | Wt 255.0 lb

## 2017-08-20 DIAGNOSIS — Z0001 Encounter for general adult medical examination with abnormal findings: Secondary | ICD-10-CM | POA: Diagnosis not present

## 2017-08-20 DIAGNOSIS — L03319 Cellulitis of trunk, unspecified: Secondary | ICD-10-CM | POA: Diagnosis not present

## 2017-08-20 DIAGNOSIS — Z0000 Encounter for general adult medical examination without abnormal findings: Secondary | ICD-10-CM

## 2017-08-20 DIAGNOSIS — Z23 Encounter for immunization: Secondary | ICD-10-CM | POA: Diagnosis not present

## 2017-08-20 DIAGNOSIS — K219 Gastro-esophageal reflux disease without esophagitis: Secondary | ICD-10-CM | POA: Diagnosis not present

## 2017-08-20 LAB — COMPREHENSIVE METABOLIC PANEL
ALK PHOS: 101 U/L (ref 39–117)
ALT: 18 U/L (ref 0–35)
AST: 20 U/L (ref 0–37)
Albumin: 3.8 g/dL (ref 3.5–5.2)
BUN: 10 mg/dL (ref 6–23)
CO2: 27 meq/L (ref 19–32)
Calcium: 9.4 mg/dL (ref 8.4–10.5)
Chloride: 104 mEq/L (ref 96–112)
Creatinine, Ser: 0.78 mg/dL (ref 0.40–1.20)
GFR: 102.97 mL/min (ref >60.00)
GLUCOSE: 89 mg/dL (ref 70–99)
POTASSIUM: 4.3 meq/L (ref 3.5–5.1)
SODIUM: 138 meq/L (ref 135–145)
TOTAL PROTEIN: 6.7 g/dL (ref 6.0–8.3)
Total Bilirubin: 0.4 mg/dL (ref 0.2–1.2)

## 2017-08-20 LAB — URINALYSIS, ROUTINE W REFLEX MICROSCOPIC
BILIRUBIN URINE: NEGATIVE
Hgb urine dipstick: NEGATIVE
KETONES UR: NEGATIVE
Leukocytes, UA: NEGATIVE
Nitrite: NEGATIVE
RBC / HPF: NONE SEEN (ref 0–?)
Total Protein, Urine: NEGATIVE
UROBILINOGEN UA: 0.2 (ref 0.0–1.0)
Urine Glucose: NEGATIVE
WBC, UA: NONE SEEN (ref 0–?)
pH: 6 (ref 5.0–8.0)

## 2017-08-20 LAB — LIPID PANEL
Cholesterol: 191 mg/dL (ref 0–200)
HDL: 46.6 mg/dL (ref >39.00)
LDL CALC: 133 mg/dL — AB (ref 0–99)
NONHDL: 143.94
Total CHOL/HDL Ratio: 4
Triglycerides: 57 mg/dL (ref 0.0–149.0)
VLDL: 11.4 mg/dL (ref 0.0–40.0)

## 2017-08-20 LAB — CBC WITH DIFFERENTIAL/PLATELET
BASOS ABS: 0 10*3/uL (ref 0.0–0.1)
Basophils Relative: 0.5 % (ref 0.0–3.0)
EOS PCT: 1.1 % (ref 0.0–5.0)
Eosinophils Absolute: 0.1 10*3/uL (ref 0.0–0.7)
HCT: 40.6 % (ref 36.0–46.0)
Hemoglobin: 13.4 g/dL (ref 12.0–15.0)
LYMPHS ABS: 2.5 10*3/uL (ref 0.7–4.0)
Lymphocytes Relative: 32.8 % (ref 12.0–46.0)
MCHC: 33.1 g/dL (ref 30.0–36.0)
MCV: 84.9 fl (ref 78.0–100.0)
MONO ABS: 0.4 10*3/uL (ref 0.1–1.0)
Monocytes Relative: 5.1 % (ref 3.0–12.0)
NEUTROS PCT: 60.5 % (ref 43.0–77.0)
Neutro Abs: 4.6 10*3/uL (ref 1.4–7.7)
Platelets: 245 10*3/uL (ref 150.0–400.0)
RBC: 4.78 Mil/uL (ref 3.87–5.11)
RDW: 13.8 % (ref 11.5–15.5)
WBC: 7.6 10*3/uL (ref 4.0–10.5)

## 2017-08-20 LAB — HEMOGLOBIN A1C: HEMOGLOBIN A1C: 5.9 % (ref 4.6–6.5)

## 2017-08-20 LAB — TSH: TSH: 1.61 u[IU]/mL (ref 0.35–4.50)

## 2017-08-20 MED ORDER — CEPHALEXIN 500 MG PO CAPS: 500.0000 mg | ORAL_CAPSULE | Freq: Two times a day (BID) | ORAL | 0 refills | Status: AC

## 2017-08-20 NOTE — Assessment & Plan Note (Signed)
Non-adherent with Protonix. Poor diet. Coffee is a major trigger. Avoid trigger foods. Start GERD diet. Protonix daily x 2 weeks then wean to QOD. Discussed weight loss is typically very beneficial for these symptoms. Follow-up discussed.

## 2017-08-20 NOTE — Assessment & Plan Note (Signed)
Body mass index is 45.17 kg/m. Discussed dietary and exercise recommendations. She wants to start back with gym membership and diet. Will follow.

## 2017-08-20 NOTE — Assessment & Plan Note (Signed)
>>  ASSESSMENT AND PLAN FOR GERD WITH ESOPHAGITIS WRITTEN ON 08/20/2017 10:26 AM BY Marcelline Mates C, PA-C  Non-adherent with Protonix. Poor diet. Coffee is a major trigger. Avoid trigger foods. Start GERD diet. Protonix daily x 2 weeks then wean to QOD. Discussed weight loss is typically very beneficial for these symptoms. Follow-up discussed.

## 2017-08-20 NOTE — Assessment & Plan Note (Signed)
Underneath left breast. Mild. Start keflex. Supportive measures reviewed. Strict return precautions reviewed.

## 2017-08-20 NOTE — Progress Notes (Signed)
Patient presents to clinic today for annual exam.  Patient is fasting for labs. Diet -- Poor diet presently. Large portions and bad choices per patient. Exercise -- Is joining a gym with her husband. Is hoping to be consistent with exercise after the holidays. Body mass index is 45.17 kg/m.  Acute Concerns: Patient endorses tender lump underneath the left breast x 6 days. Notes it is sore. Has improved in size but is still present. Denies any drainage from the area. Denies itching. Denies fever, chills, or malaise.   Chronic Issues: GERD -- Is currently on a regimen of Protonix 40 mg, taking during flare-ups only. Endorses coffee is a major trigger for her. Is avoiding late-night eating. Denies epigastric pain, nausea or vomiting.   Health Maintenance: Immunizations -- Tetanus up-to-date. Agrees to flu shot daily.  Mammogram -- up-to-date. PAP -- up-to-date. Followed by GYN.  Past Medical History:  Diagnosis Date  . Chicken pox   . Drug abuse in remission   . Elevated blood pressure   . Fibroids   . GERD (gastroesophageal reflux disease)   . 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 Hypertension   . Hypolipidemia   . Ovarian cyst   . STD (sexually transmitted disease)    gc over 49yrs ago  . UTI (lower urinary tract infection)     Past Surgical History:  Procedure Laterality Date  . FOOT SURGERY Right    bone spur  . WISDOM TOOTH EXTRACTION      Current Outpatient Medications on File Prior to Visit  Medication Sig Dispense Refill  . Cholecalciferol (VITAMIN D3) 2000 units TABS Take 1 tablet by mouth daily.    . pantoprazole (PROTONIX) 40 MG tablet Take 1 tablet (40 mg total) by mouth daily. 30 tablet 1  . Calcium Carbonate (CALCIUM 600 PO) Take 1 tablet by mouth daily.    . Omega-3 Fatty Acids (FISH OIL) 1200 MG CAPS Take 1 capsule by mouth daily.     No current  facility-administered medications on file prior to visit.     No Known Allergies  Family History  Problem Relation Age of Onset  . Alcoholism Father   . Drug abuse Father   . Prostate cancer Father   . Hypertension Mother   . Diabetes Mother   . Hyperlipidemia Mother   . Heart murmur Mother   . Alcoholism Maternal Grandfather   . Dementia Maternal Grandfather   . Arthritis Maternal Grandmother   . Breast cancer Maternal Grandmother 60  . Stroke Maternal Grandmother   . Hypertension Maternal Grandmother   . Stroke Maternal Aunt   . Colon cancer Maternal Aunt   . Hyperlipidemia Maternal Aunt   . Alcoholism Maternal Uncle        x2  . Alcoholism Maternal Aunt   . Aneurysm Maternal Aunt   . Hypertension Maternal Aunt        x2  . Stroke Brother   . Drug abuse Brother   . Heart disease Brother   . Bone cancer Paternal Aunt     Social History   Socioeconomic History  . Marital status: Single    Spouse name: Not on file  . Number of children: Not on file  . Years of education: Not on file  . Highest education level: Not on file  Social Needs  . Financial resource strain: Not on file  .  Food insecurity - worry: Not on file  . Food insecurity - inability: Not on file  . Transportation needs - medical: Not on file  . Transportation needs - non-medical: Not on file  Occupational History  . Not on file  Tobacco Use  . Smoking status: Former Smoker    Packs/day: 0.50    Years: 27.00    Pack years: 13.50    Types: Cigarettes    Last attempt to quit: 02/22/2016    Years since quitting: 1.4  . Smokeless tobacco: Never Used  . Tobacco comment: STOPPED SMOKING SINCE February 22, 2016  Substance and Sexual Activity  . Alcohol use: No    Alcohol/week: 0.0 oz  . Drug use: No  . Sexual activity: Yes    Partners: Male    Birth control/protection: None    Comment: 1st intercourse 46 yo- More than 5 partners  Other Topics Concern  . Not on file  Social History Narrative  . Not  on file   Review of Systems  Constitutional: Negative for fever and weight loss.  HENT: Negative for ear discharge, ear pain, hearing loss and tinnitus.   Eyes: Negative for blurred vision, double vision, photophobia and pain.  Respiratory: Negative for cough and shortness of breath.   Cardiovascular: Negative for chest pain and palpitations.  Gastrointestinal: Negative for abdominal pain, blood in stool, constipation, diarrhea, heartburn, melena, nausea and vomiting.  Genitourinary: Negative for dysuria, flank pain, frequency, hematuria and urgency.  Musculoskeletal: Negative for falls.  Neurological: Negative for dizziness, loss of consciousness and headaches.  Endo/Heme/Allergies: Negative for environmental allergies.  Psychiatric/Behavioral: Negative for depression, hallucinations, substance abuse and suicidal ideas. The patient is not nervous/anxious and does not have insomnia.    BP 130/80   Pulse 69   Temp 98.2 F (36.8 C) (Oral)   Resp 14   Ht 5\' 3"  (1.6 m)   Wt 255 lb (115.7 kg)   SpO2 96%   BMI 45.17 kg/m   Physical Exam  Constitutional: She is oriented to person, place, and time and well-developed, well-nourished, and in no distress.  HENT:  Head: Normocephalic and atraumatic.  Right Ear: Tympanic membrane, external ear and ear canal normal.  Left Ear: Tympanic membrane, external ear and ear canal normal.  Nose: Nose normal. No mucosal edema.  Mouth/Throat: Uvula is midline, oropharynx is clear and moist and mucous membranes are normal. No oropharyngeal exudate or posterior oropharyngeal erythema.  Eyes: Conjunctivae are normal. Pupils are equal, round, and reactive to light.  Neck: Neck supple. No thyromegaly present.  Cardiovascular: Normal rate, regular rhythm, normal heart sounds and intact distal pulses.  Pulmonary/Chest: Effort normal and breath sounds normal. No respiratory distress. She has no wheezes. She has no rales.    Abdominal: Soft. Bowel sounds are  normal. She exhibits no distension and no mass. There is no tenderness. There is no rebound and no guarding.  Lymphadenopathy:    She has no cervical adenopathy.  Neurological: She is alert and oriented to person, place, and time. No cranial nerve deficit.  Skin: Skin is warm and dry. No rash noted.  Psychiatric: Affect normal.  Vitals reviewed.  Assessment/Plan: Acid reflux Non-adherent with Protonix. Poor diet. Coffee is a major trigger. Avoid trigger foods. Start GERD diet. Protonix daily x 2 weeks then wean to QOD. Discussed weight loss is typically very beneficial for these symptoms. Follow-up discussed.  Visit for preventive health examination Depression screen negative. Health Maintenance reviewed -- Flu shot updated. Other parameters  up-to-date. Followed by GYN. Preventive schedule discussed and handout given in AVS. Will obtain fasting labs today.    Morbid obesity Body mass index is 45.17 kg/m. Discussed dietary and exercise recommendations. She wants to start back with gym membership and diet. Will follow.  Cellulitis of trunk Underneath left breast. Mild. Start keflex. Supportive measures reviewed. Strict return precautions reviewed.    Leeanne Rio, PA-C

## 2017-08-20 NOTE — Progress Notes (Signed)
Pre visit review using our clinic review tool, if applicable. No additional management support is needed unless otherwise documented below in the visit note. 

## 2017-08-20 NOTE — Assessment & Plan Note (Signed)
Depression screen negative. Health Maintenance reviewed -- Flu shot updated. Other parameters up-to-date. Followed by GYN. Preventive schedule discussed and handout given in AVS. Will obtain fasting labs today.

## 2017-08-20 NOTE — Patient Instructions (Signed)
Please go to the lab for blood work.   Our office will call you with your results unless you have chosen to receive results via MyChart.  If your blood work is normal we will follow-up each year for physicals and as scheduled for chronic medical problems.  If anything is abnormal we will treat accordingly and get you in for a follow-up.  Please take the Protonix daily x 2 weeks. Then wean down to every other day dosing. Avoid trigger foods. Work hard on diet and exercise.  Please take the antibiotic as directed with food.  If symptoms are not resolving, please let me know. Return immediately if there is any worsening of symptoms.   Preventive Care 40-64 Years, Female Preventive care refers to lifestyle choices and visits with your health care provider that can promote health and wellness. What does preventive care include?  A yearly physical exam. This is also called an annual well check.  Dental exams once or twice a year.  Routine eye exams. Ask your health care provider how often you should have your eyes checked.  Personal lifestyle choices, including: ? Daily care of your teeth and gums. ? Regular physical activity. ? Eating a healthy diet. ? Avoiding tobacco and drug use. ? Limiting alcohol use. ? Practicing safe sex. ? Taking low-dose aspirin daily starting at age 47. ? Taking vitamin and mineral supplements as recommended by your health care provider. What happens during an annual well check? The services and screenings done by your health care provider during your annual well check will depend on your age, overall health, lifestyle risk factors, and family history of disease. Counseling Your health care provider may ask you questions about your:  Alcohol use.  Tobacco use.  Drug use.  Emotional well-being.  Home and relationship well-being.  Sexual activity.  Eating habits.  Work and work Statistician.  Method of birth control.  Menstrual  cycle.  Pregnancy history.  Screening You may have the following tests or measurements:  Height, weight, and BMI.  Blood pressure.  Lipid and cholesterol levels. These may be checked every 5 years, or more frequently if you are over 38 years old.  Skin check.  Lung cancer screening. You may have this screening every year starting at age 70 if you have a 30-pack-year history of smoking and currently smoke or have quit within the past 15 years.  Fecal occult blood test (FOBT) of the stool. You may have this test every year starting at age 4.  Flexible sigmoidoscopy or colonoscopy. You may have a sigmoidoscopy every 5 years or a colonoscopy every 10 years starting at age 67.  Hepatitis C blood test.  Hepatitis B blood test.  Sexually transmitted disease (STD) testing.  Diabetes screening. This is done by checking your blood sugar (glucose) after you have not eaten for a while (fasting). You may have this done every 1-3 years.  Mammogram. This may be done every 1-2 years. Talk to your health care provider about when you should start having regular mammograms. This may depend on whether you have a family history of breast cancer.  BRCA-related cancer screening. This may be done if you have a family history of breast, ovarian, tubal, or peritoneal cancers.  Pelvic exam and Pap test. This may be done every 3 years starting at age 71. Starting at age 78, this may be done every 5 years if you have a Pap test in combination with an HPV test.  Bone density scan. This  is done to screen for osteoporosis. You may have this scan if you are at high risk for osteoporosis.  Discuss your test results, treatment options, and if necessary, the need for more tests with your health care provider. Vaccines Your health care provider may recommend certain vaccines, such as:  Influenza vaccine. This is recommended every year.  Tetanus, diphtheria, and acellular pertussis (Tdap, Td) vaccine. You may  need a Td booster every 10 years.  Varicella vaccine. You may need this if you have not been vaccinated.  Zoster vaccine. You may need this after age 13.  Measles, mumps, and rubella (MMR) vaccine. You may need at least one dose of MMR if you were born in 1957 or later. You may also need a second dose.  Pneumococcal 13-valent conjugate (PCV13) vaccine. You may need this if you have certain conditions and were not previously vaccinated.  Pneumococcal polysaccharide (PPSV23) vaccine. You may need one or two doses if you smoke cigarettes or if you have certain conditions.  Meningococcal vaccine. You may need this if you have certain conditions.  Hepatitis A vaccine. You may need this if you have certain conditions or if you travel or work in places where you may be exposed to hepatitis A.  Hepatitis B vaccine. You may need this if you have certain conditions or if you travel or work in places where you may be exposed to hepatitis B.  Haemophilus influenzae type b (Hib) vaccine. You may need this if you have certain conditions.  Talk to your health care provider about which screenings and vaccines you need and how often you need them. This information is not intended to replace advice given to you by your health care provider. Make sure you discuss any questions you have with your health care provider. Document Released: 10/01/2015 Document Revised: 05/24/2016 Document Reviewed: 07/06/2015 Elsevier Interactive Patient Education  2017 [employer].

## 2017-08-21 ENCOUNTER — Other Ambulatory Visit: Payer: Self-pay | Admitting: Physician Assistant

## 2017-10-16 ENCOUNTER — Ambulatory Visit (INDEPENDENT_AMBULATORY_CARE_PROVIDER_SITE_OTHER): Payer: PRIVATE HEALTH INSURANCE | Admitting: Women's Health

## 2017-10-16 ENCOUNTER — Encounter: Payer: Self-pay | Admitting: Women's Health

## 2017-10-16 VITALS — BP 118/78 | Ht 63.0 in | Wt 254.4 lb

## 2017-10-16 DIAGNOSIS — Z01419 Encounter for gynecological examination (general) (routine) without abnormal findings: Secondary | ICD-10-CM | POA: Diagnosis not present

## 2017-10-16 NOTE — Progress Notes (Signed)
Nicole Mueller [birth year] [id number]    History: 47yo MBF  G3P1 presents for annual exam. Monthly cycles 3 days, no contraception without pregnancy for many years. 2016 Pap normal with positive HR HPV-16,18,45. 2017 Pap normal with negative HR HPV. History of 2 small fibroids.   Past medical history, past surgical history, family history and social history were all reviewed and documented in the EPIC chart. Works at [employer]. Newly married in 05/2017. 1 stepdaughter living at home, causes stress. Family history of diabetes and hypertension.   ROS:  A ROS was performed and pertinent positives and negatives are included.  Exam: Appears well.  Vitals:   10/16/17 1609  BP: 118/78  Weight: 254 lb 6.4 oz (115.4 kg)  Height: 5\' 3"  (1.6 m)   Body mass index is 45.06 kg/m.   General appearance:  Normal Thyroid:  Symmetrical, normal in size, without palpable masses or nodularity. Respiratory  Auscultation:  Clear without wheezing or rhonchi Cardiovascular  Auscultation:  Regular rate, without rubs, murmurs or gallops  Edema/varicosities:  Not grossly evident Abdominal  Soft,nontender, without masses, guarding or rebound.  Liver/spleen:  No organomegaly noted  Hernia:  None appreciated  Skin  Inspection:  Grossly normal   Breasts: Examined lying and sitting. Pendulous breasts   Right: Without masses, retractions, discharge or axillary adenopathy.     Left: Without masses, retractions, discharge or axillary adenopathy. Gentitourinary   Inguinal/mons:  Normal without inguinal adenopathy  External genitalia:  Normal.   BUS/Urethra/Skene's glands:  Normal  Vagina:  Normal  Cervix:  Normal  Uterus:  normal in size, shape and contour.  Midline and mobile  Adnexa/parametria:     Rt: Without masses or tenderness.   Lt: Without masses or tenderness.  Anus and perineum: Normal  Digital rectal exam: Not done.  Assessment/Plan:  47 y.o. MBF G3P1  for annual exam with no  complaints.   2016 Normal pap with positive HR HPV with negative 16, 18, and 45/Normal pap 2017l  Monthly cycle/no contraception History of 2 small fibroids Obesity History of low vitamin D   Plan: Declines contraception, would be alright if pregnancy happened. SBE's, annual screening mammogram will be scheduled. Conseled on weight loss, increasing exercise, decreasing calories. 20 pound weight gain in past year. Will continue to take Vitamin D 2000 daily. Pap  new screening guidelines reviewed.    [place], 5:00 PM 10/16/2017

## 2017-10-16 NOTE — Patient Instructions (Signed)
Health Maintenance, Female Adopting a healthy lifestyle and getting preventive care can go a long way to promote health and wellness. Talk with your health care provider about what schedule of regular examinations is right for you. This is a good chance for you to check in with your provider about disease prevention and staying healthy. In between checkups, there are plenty of things you can do on your own. Experts have done a lot of research about which lifestyle changes and preventive measures are most likely to keep you healthy. Ask your health care provider for more information. Weight and diet Eat a healthy diet  Be sure to include plenty of vegetables, fruits, low-fat dairy products, and lean protein.  Do not eat a lot of foods high in solid fats, added sugars, or salt.  Get regular exercise. This is one of the most important things you can do for your health. ? Most adults should exercise for at least 150 minutes each week. The exercise should increase your heart rate and make you sweat (moderate-intensity exercise). ? Most adults should also do strengthening exercises at least twice a week. This is in addition to the moderate-intensity exercise.  Maintain a healthy weight  Body mass index (BMI) is a measurement that can be used to identify possible weight problems. It estimates body fat based on height and weight. Your health care provider can help determine your BMI and help you achieve or maintain a healthy weight.  For females 20 years of age and older: ? A BMI below 18.5 is considered underweight. ? A BMI of 18.5 to 24.9 is normal. ? A BMI of 25 to 29.9 is considered overweight. ? A BMI of 30 and above is considered obese.  Watch levels of cholesterol and blood lipids  You should start having your blood tested for lipids and cholesterol at 45 years of age, then have this test every 5 years.  You may need to have your cholesterol levels checked more often if: ? Your lipid or  cholesterol levels are high. ? You are older than 45 years of age. ? You are at high risk for heart disease.  Cancer screening Lung Cancer  Lung cancer screening is recommended for adults 55-80 years old who are at high risk for lung cancer because of a history of smoking.  A yearly low-dose CT scan of the lungs is recommended for people who: ? Currently smoke. ? Have quit within the past 15 years. ? Have at least a 30-pack-year history of smoking. A pack year is smoking an average of one pack of cigarettes a day for 1 year.  Yearly screening should continue until it has been 15 years since you quit.  Yearly screening should stop if you develop a health problem that would prevent you from having lung cancer treatment.  Breast Cancer  Practice breast self-awareness. This means understanding how your breasts normally appear and feel.  It also means doing regular breast self-exams. Let your health care provider know about any changes, no matter how small.  If you are in your 20s or 30s, you should have a clinical breast exam (CBE) by a health care provider every 1-3 years as part of a regular health exam.  If you are 40 or older, have a CBE every year. Also consider having a breast X-ray (mammogram) every year.  If you have a family history of breast cancer, talk to your health care provider about genetic screening.  If you are at high risk   for breast cancer, talk to your health care provider about having an MRI and a mammogram every year.  Breast cancer gene (BRCA) assessment is recommended for women who have family members with BRCA-related cancers. BRCA-related cancers include: ? Breast. ? Ovarian. ? Tubal. ? Peritoneal cancers.  Results of the assessment will determine the need for genetic counseling and BRCA1 and BRCA2 testing.  Cervical Cancer Your health care provider may recommend that you be screened regularly for cancer of the pelvic organs (ovaries, uterus, and  vagina). This screening involves a pelvic examination, including checking for microscopic changes to the surface of your cervix (Pap test). You may be encouraged to have this screening done every 3 years, beginning at age 22.  For women ages 56-65, health care providers may recommend pelvic exams and Pap testing every 3 years, or they may recommend the Pap and pelvic exam, combined with testing for human papilloma virus (HPV), every 5 years. Some types of HPV increase your risk of cervical cancer. Testing for HPV may also be done on women of any age with unclear Pap test results.  Other health care providers may not recommend any screening for nonpregnant women who are considered low risk for pelvic cancer and who do not have symptoms. Ask your health care provider if a screening pelvic exam is right for you.  If you have had past treatment for cervical cancer or a condition that could lead to cancer, you need Pap tests and screening for cancer for at least 20 years after your treatment. If Pap tests have been discontinued, your risk factors (such as having a new sexual partner) need to be reassessed to determine if screening should resume. Some women have medical problems that increase the chance of getting cervical cancer. In these cases, your health care provider may recommend more frequent screening and Pap tests.  Colorectal Cancer  This type of cancer can be detected and often prevented.  Routine colorectal cancer screening usually begins at 45 years of age and continues through 45 years of age.  Your health care provider may recommend screening at an earlier age if you have risk factors for colon cancer.  Your health care provider may also recommend using home test kits to check for hidden blood in the stool.  A small camera at the end of a tube can be used to examine your colon directly (sigmoidoscopy or colonoscopy). This is done to check for the earliest forms of colorectal  cancer.  Routine screening usually begins at age 33.  Direct examination of the colon should be repeated every 5-10 years through 45 years of age. However, you may need to be screened more often if early forms of precancerous polyps or small growths are found.  Skin Cancer  Check your skin from head to toe regularly.  Tell your health care provider about any new moles or changes in moles, especially if there is a change in a mole's shape or color.  Also tell your health care provider if you have a mole that is larger than the size of a pencil eraser.  Always use sunscreen. Apply sunscreen liberally and repeatedly throughout the day.  Protect yourself by wearing long sleeves, pants, a wide-brimmed hat, and sunglasses whenever you are outside.  Heart disease, diabetes, and high blood pressure  High blood pressure causes heart disease and increases the risk of stroke. High blood pressure is more likely to develop in: ? People who have blood pressure in the high end of  the normal range (130-139/85-89 mm Hg). ? People who are overweight or obese. ? People who are African American.  If you are 21-29 years of age, have your blood pressure checked every 3-5 years. If you are 3 years of age or older, have your blood pressure checked every year. You should have your blood pressure measured twice-once when you are at a hospital or clinic, and once when you are not at a hospital or clinic. Record the average of the two measurements. To check your blood pressure when you are not at a hospital or clinic, you can use: ? An automated blood pressure machine at a pharmacy. ? A home blood pressure monitor.  If you are between 17 years and 37 years old, ask your health care provider if you should take aspirin to prevent strokes.  Have regular diabetes screenings. This involves taking a blood sample to check your fasting blood sugar level. ? If you are at a normal weight and have a low risk for diabetes,  have this test once every three years after 45 years of age. ? If you are overweight and have a high risk for diabetes, consider being tested at a younger age or more often. Preventing infection Hepatitis B  If you have a higher risk for hepatitis B, you should be screened for this virus. You are considered at high risk for hepatitis B if: ? You were born in a country where hepatitis B is common. Ask your health care provider which countries are considered high risk. ? Your parents were born in a high-risk country, and you have not been immunized against hepatitis B (hepatitis B vaccine). ? You have HIV or AIDS. ? You use needles to inject street drugs. ? You live with someone who has hepatitis B. ? You have had sex with someone who has hepatitis B. ? You get hemodialysis treatment. ? You take certain medicines for conditions, including cancer, organ transplantation, and autoimmune conditions.  Hepatitis C  Blood testing is recommended for: ? Everyone born from [birth year] through 1963. ? Anyone with known risk factors for hepatitis C.  Sexually transmitted infections (STIs)  You should be screened for sexually transmitted infections (STIs) including gonorrhea and chlamydia if: ? You are sexually active and are younger than 45 years of age. ? You are older than 45 years of age and your health care provider tells you that you are at risk for this type of infection. ? Your sexual activity has changed since you were last screened and you are at an increased risk for chlamydia or gonorrhea. Ask your health care provider if you are at risk.  If you do not have HIV, but are at risk, it may be recommended that you take a prescription medicine daily to prevent HIV infection. This is called pre-exposure prophylaxis (PrEP). You are considered at risk if: ? You are sexually active and do not regularly use condoms or know the HIV status of your partner(s). ? You take drugs by injection. ? You are  sexually active with a partner who has HIV.  Talk with your health care provider about whether you are at high risk of being infected with HIV. If you choose to begin PrEP, you should first be tested for HIV. You should then be tested every 3 months for as long as you are taking PrEP. Pregnancy  If you are premenopausal and you may become pregnant, ask your health care provider about preconception counseling.  If you may become  pregnant, take 400 to 800 micrograms (mcg) of folic acid every day.  If you want to prevent pregnancy, talk to your health care provider about birth control (contraception). Osteoporosis and menopause  Osteoporosis is a disease in which the bones lose minerals and strength with aging. This can result in serious bone fractures. Your risk for osteoporosis can be identified using a bone density scan.  If you are 65 years of age or older, or if you are at risk for osteoporosis and fractures, ask your health care provider if you should be screened.  Ask your health care provider whether you should take a calcium or vitamin D supplement to lower your risk for osteoporosis.  Menopause may have certain physical symptoms and risks.  Hormone replacement therapy may reduce some of these symptoms and risks. Talk to your health care provider about whether hormone replacement therapy is right for you. Follow these instructions at home:  Schedule regular health, dental, and eye exams.  Stay current with your immunizations.  Do not use any tobacco products including cigarettes, chewing tobacco, or electronic cigarettes.  If you are pregnant, do not drink alcohol.  If you are breastfeeding, limit how much and how often you drink alcohol.  Limit alcohol intake to no more than 1 drink per day for nonpregnant women. One drink equals 12 ounces of beer, 5 ounces of wine, or 1 ounces of hard liquor.  Do not use street drugs.  Do not share needles.  Ask your health care  provider for help if you need support or information about quitting drugs.  Tell your health care provider if you often feel depressed.  Tell your health care provider if you have ever been abused or do not feel safe at home. This information is not intended to replace advice given to you by your health care provider. Make sure you discuss any questions you have with your health care provider. Document Released: 03/20/2011 Document Revised: 02/10/2016 Document Reviewed: 06/08/2015 Elsevier Interactive Patient Education  2018 [employer]. Carbohydrate Counting for Diabetes Mellitus, Adult Carbohydrate counting is a method for keeping track of how many carbohydrates you eat. Eating carbohydrates naturally increases the amount of sugar (glucose) in the blood. Counting how many carbohydrates you eat helps keep your blood glucose within normal limits, which helps you manage your diabetes (diabetes mellitus). It is important to know how many carbohydrates you can safely have in each meal. This is different for every person. A diet and nutrition specialist (registered dietitian) can help you make a meal plan and calculate how many carbohydrates you should have at each meal and snack. Carbohydrates are found in the following foods:  Grains, such as breads and cereals.  Dried beans and soy products.  Starchy vegetables, such as potatoes, peas, and corn.  Fruit and fruit juices.  Milk and yogurt.  Sweets and snack foods, such as cake, cookies, candy, chips, and soft drinks.  How do I count carbohydrates? There are two ways to count carbohydrates in food. You can use either of the methods or a combination of both. Reading "Nutrition Facts" on packaged food The "Nutrition Facts" list is included on the labels of almost all packaged foods and beverages in the [place] It includes:  The serving size.  Information about nutrients in each serving, including the grams (g) of carbohydrate per  serving.  To use the "Nutrition Facts":  Decide how many servings you will have.  Multiply the number of servings by the number of carbohydrates   per serving.  The resulting number is the total amount of carbohydrates that you will be having.  Learning standard serving sizes of other foods When you eat foods containing carbohydrates that are not packaged or do not include "Nutrition Facts" on the label, you need to measure the servings in order to count the amount of carbohydrates:  Measure the foods that you will eat with a food scale or measuring cup, if needed.  Decide how many standard-size servings you will eat.  Multiply the number of servings by 15. Most carbohydrate-rich foods have about 15 g of carbohydrates per serving. ? For example, if you eat 8 oz (170 g) of strawberries, you will have eaten 2 servings and 30 g of carbohydrates (2 servings x 15 g = 30 g).  For foods that have more than one food mixed, such as soups and casseroles, you must count the carbohydrates in each food that is included.  The following list contains standard serving sizes of common carbohydrate-rich foods. Each of these servings has about 15 g of carbohydrates:   hamburger bun or  English muffin.   oz (15 mL) syrup.   oz (14 g) jelly.  1 slice of bread.  1 six-inch tortilla.  3 oz (85 g) cooked rice or pasta.  4 oz (113 g) cooked dried beans.  4 oz (113 g) starchy vegetable, such as peas, corn, or potatoes.  4 oz (113 g) hot cereal.  4 oz (113 g) mashed potatoes or  of a large baked potato.  4 oz (113 g) canned or frozen fruit.  4 oz (120 mL) fruit juice.  4-6 crackers.  6 chicken nuggets.  6 oz (170 g) unsweetened dry cereal.  6 oz (170 g) plain fat-free yogurt or yogurt sweetened with artificial sweeteners.  8 oz (240 mL) milk.  8 oz (170 g) fresh fruit or one small piece of fruit.  24 oz (680 g) popped popcorn.  Example of carbohydrate counting Sample meal  3  oz (85 g) chicken breast.  6 oz (170 g) brown rice.  4 oz (113 g) corn.  8 oz (240 mL) milk.  8 oz (170 g) strawberries with sugar-free whipped topping. Carbohydrate calculation 1. Identify the foods that contain carbohydrates: ? Rice. ? Corn. ? Milk. ? Strawberries. 2. Calculate how many servings you have of each food: ? 2 servings rice. ? 1 serving corn. ? 1 serving milk. ? 1 serving strawberries. 3. Multiply each number of servings by 15 g: ? 2 servings rice x 15 g = 30 g. ? 1 serving corn x 15 g = 15 g. ? 1 serving milk x 15 g = 15 g. ? 1 serving strawberries x 15 g = 15 g. 4. Add together all of the amounts to find the total grams of carbohydrates eaten: ? 30 g + 15 g + 15 g + 15 g = 75 g of carbohydrates total. This information is not intended to replace advice given to you by your health care provider. Make sure you discuss any questions you have with your health care provider. Document Released: 09/04/2005 Document Revised: 03/24/2016 Document Reviewed: 02/16/2016 Elsevier Interactive Patient Education  [employer].

## 2017-10-17 LAB — URINALYSIS W MICROSCOPIC + REFLEX CULTURE
Bacteria, UA: NONE SEEN /HPF
Bilirubin Urine: NEGATIVE
Glucose, UA: NEGATIVE
HGB URINE DIPSTICK: NEGATIVE
HYALINE CAST: NONE SEEN /LPF
Ketones, ur: NEGATIVE
Leukocyte Esterase: NEGATIVE
Nitrites, Initial: NEGATIVE
PROTEIN: NEGATIVE
RBC / HPF: NONE SEEN /HPF (ref 0–2)
Specific Gravity, Urine: 1.025 (ref 1.001–1.03)
WBC, UA: NONE SEEN /HPF (ref 0–5)

## 2017-10-17 NOTE — Addendum Note (Signed)
Addended by: Lorine Bears on: 10/17/2017 08:06 AM   Modules accepted: Orders

## 2017-10-22 ENCOUNTER — Other Ambulatory Visit: Payer: Self-pay | Admitting: Women's Health

## 2017-10-22 LAB — PAP IG W/ RFLX HPV ASCU

## 2017-10-22 MED ORDER — METRONIDAZOLE 500 MG PO TABS: 500.0000 mg | ORAL_TABLET | Freq: Two times a day (BID) | ORAL | 0 refills | Status: DC

## 2018-01-08 ENCOUNTER — Telehealth: Payer: Self-pay | Admitting: Emergency Medicine

## 2018-01-08 ENCOUNTER — Other Ambulatory Visit: Payer: Self-pay | Admitting: Physician Assistant

## 2018-01-08 DIAGNOSIS — N6019 Diffuse cystic mastopathy of unspecified breast: Secondary | ICD-10-CM

## 2018-01-08 DIAGNOSIS — Z1231 Encounter for screening mammogram for malignant neoplasm of breast: Secondary | ICD-10-CM

## 2018-01-08 NOTE — Telephone Encounter (Signed)
Copied from [place] [id number]. Topic: Quick Communication - See Telephone Encounter >> Jan 08, 2018  1:33 PM Antonieta Iba C wrote: CRM for notification. See Telephone encounter for: 01/08/18.  Pt says that she went in to have a mammogram and was told that order would need to be a diagnostic screening. Pt would like to have a new order sent in for imaging.    CB: [phone]

## 2018-01-10 ENCOUNTER — Telehealth: Payer: Self-pay | Admitting: Physician Assistant

## 2018-01-10 NOTE — Telephone Encounter (Signed)
LMOVM advising Judson Roch at [facility]. Patient is due for a screening mammogram/diagnostic for bilateral breast No specific breast changes.

## 2018-01-10 NOTE — Telephone Encounter (Signed)
Copied from [place] [id number]. Topic: Quick Communication - See Telephone Encounter >> Jan 10, 2018  9:29 AM Ahmed Prima L wrote: CRM for notification. See Telephone encounter for: 01/10/18.  Sarah from the [facility] called and said they have a order for Diagnostic Mammo. She needs to know which breast is the problem, call back is [phone] ext [phone]

## 2018-01-15 ENCOUNTER — Other Ambulatory Visit: Payer: Self-pay | Admitting: Physician Assistant

## 2018-01-15 ENCOUNTER — Other Ambulatory Visit: Payer: Self-pay | Admitting: Emergency Medicine

## 2018-01-15 DIAGNOSIS — N6019 Diffuse cystic mastopathy of unspecified breast: Secondary | ICD-10-CM

## 2018-01-15 NOTE — Telephone Encounter (Signed)
Patient called on 04/23 diagnostic ultrasound for her left breast where there is a lump underneath and pain shoots through it sometimes. She was told by [facility] that she needs an order specifically for the lump, not a regular screening. Please call patient once order is placed.

## 2018-01-16 NOTE — Telephone Encounter (Signed)
LMOVM  For patient to call back and schedule appointment with pcp to discuss breast changes.

## 2018-01-23 ENCOUNTER — Other Ambulatory Visit: Payer: Self-pay

## 2018-01-23 ENCOUNTER — Encounter: Payer: Self-pay | Admitting: Physician Assistant

## 2018-01-23 ENCOUNTER — Ambulatory Visit: Payer: PRIVATE HEALTH INSURANCE | Admitting: Physician Assistant

## 2018-01-23 VITALS — BP 116/70 | HR 79 | Temp 98.6°F | Resp 16 | Ht 63.0 in | Wt 256.0 lb

## 2018-01-23 DIAGNOSIS — N644 Mastodynia: Secondary | ICD-10-CM | POA: Diagnosis not present

## 2018-01-23 DIAGNOSIS — K219 Gastro-esophageal reflux disease without esophagitis: Secondary | ICD-10-CM

## 2018-01-23 MED ORDER — PANTOPRAZOLE SODIUM 40 MG PO TBEC: 40.0000 mg | DELAYED_RELEASE_TABLET | Freq: Every day | ORAL | 3 refills | Status: DC

## 2018-01-23 NOTE — Progress Notes (Signed)
Patient presents to clinic today for assessment of left breast pain and mass noted by patient. Pain is aching and lasts a few seconds before stopping. Sometimes notes a burning sensation. Denies known trauma or injury. Has noted a small bump underneath her breast. Denies pain at this site. Denies redness, warmth or drainage. No other abnormalities noted by patient. Patient has noted pretty significant acid reflux. Has history of severe GERD, is prescribed protonix but has not been taking. Notes very poor diet and bad water intake. Denies tenesmus, melena or hematochezia.   Past Medical History:  Diagnosis Date  . Chicken pox   . Drug abuse in remission   . Elevated blood pressure   . Fibroids   . GERD (gastroesophageal reflux disease)   . 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 Hypertension   . Hypolipidemia   . Ovarian cyst   . STD (sexually transmitted disease)    gc over 72yrs ago  . UTI (lower urinary tract infection)     Current Outpatient Medications on File Prior to Visit  Medication Sig Dispense Refill  . Calcium Carbonate (CALCIUM 600 PO) Take 1 tablet by mouth daily.    . Cholecalciferol (VITAMIN D3) 2000 units TABS Take 1 tablet by mouth daily.    . Omega-3 Fatty Acids (FISH OIL) 1200 MG CAPS Take 1 capsule by mouth daily.    . pantoprazole (PROTONIX) 40 MG tablet Take 1 tablet (40 mg total) by mouth daily. 30 tablet 1  . NON FORMULARY 7 day detox     No current facility-administered medications on file prior to visit.     No Known Allergies  Family History  Problem Relation Age of Onset  . Alcoholism Father   . Drug abuse Father   . Prostate cancer Father   . Hypertension Mother   . Diabetes Mother   . Hyperlipidemia Mother   . Heart murmur Mother   . Alcoholism Maternal Grandfather   . Dementia Maternal Grandfather   . Arthritis Maternal Grandmother   . Breast  cancer Maternal Grandmother 60  . Stroke Maternal Grandmother   . Hypertension Maternal Grandmother   . Stroke Maternal Aunt   . Colon cancer Maternal Aunt   . Hyperlipidemia Maternal Aunt   . Alcoholism Maternal Uncle        x2  . Alcoholism Maternal Aunt   . Aneurysm Maternal Aunt   . Hypertension Maternal Aunt        x2  . Stroke Brother   . Drug abuse Brother   . Heart disease Brother   . Bone cancer Paternal Aunt     Social History   Socioeconomic History  . Marital status: Single    Spouse name: Not on file  . Number of children: Not on file  . Years of education: Not on file  . Highest education level: Not on file  Occupational History  . Not on file  Social Needs  . Financial resource strain: Not on file  . Food insecurity:    Worry: Not on file    Inability: Not on file  . Transportation needs:    Medical: Not on file    Non-medical: Not on file  Tobacco Use  . Smoking status: Former Smoker    Packs/day: 0.50    Years: 27.00    Pack years: 13.50    Types: Cigarettes  Last attempt to quit: 02/22/2016    Years since quitting: 1.9  . Smokeless tobacco: Never Used  . Tobacco comment: STOPPED SMOKING SINCE February 22, 2016  Substance and Sexual Activity  . Alcohol use: No    Alcohol/week: 0.0 oz  . Drug use: No  . Sexual activity: Yes    Partners: Male    Birth control/protection: None    Comment: 1st intercourse 47 yo- More than 5 partners  Lifestyle  . Physical activity:    Days per week: Not on file    Minutes per session: Not on file  . Stress: Not on file  Relationships  . Social connections:    Talks on phone: Not on file    Gets together: Not on file    Attends religious service: Not on file    Active member of club or organization: Not on file    Attends meetings of clubs or organizations: Not on file    Relationship status: Not on file  Other Topics Concern  . Not on file  Social History Narrative  . Not on file   Review of Systems -  See HPI.  All other ROS are negative.  BP 116/70   Pulse 79   Temp 98.6 F (37 C) (Oral)   Resp 16   Ht 5\' 3"  (1.6 m)   Wt 256 lb (116.1 kg)   SpO2 99%   BMI 45.35 kg/m   Physical Exam  Constitutional: She appears well-developed and well-nourished.  HENT:  Head: Normocephalic and atraumatic.  Eyes: Conjunctivae are normal.  Cardiovascular: Normal rate, regular rhythm and normal heart sounds.  Pulmonary/Chest: Effort normal and breath sounds normal.    Abdominal: Normal appearance.  Vitals reviewed.  Assessment/Plan: 1. Breast pain, left With small cystic lesion noted at 6 o'clock position. No sign of infection. There is an incidental rubbery 1 cm mobile mass at the 9 o'clock position. No lymphadenopathy noted. Will obtain diagnostic mammogram and [place]. - MM Digital Diagnostic Bilat; Future - US BREAST COMPLETE UNI LEFT INC AXILLA; Future  2. Gastroesophageal reflux disease without esophagitis Supportive measures reviewed. Restart Protonix taking daily as directed. Follow-up if not resolving.  - pantoprazole (PROTONIX) 40 MG tablet; Take 1 tablet (40 mg total) by mouth daily.  Dispense: 30 tablet; Refill: 3   Leeanne Rio, [place]

## 2018-01-23 NOTE — Patient Instructions (Signed)
Please stop by the front desk to speak with Benjamine Mola to schedule your breast imaging.  I will call with your results.  I do think some of the pain is referred from the stomach due to your reflux.  Please work on smoking cessation again.  Please take the Protonix daily over the next 2 weeks. Limit late-night eating. Elevate the head of your bed. Try to read and follow the dietary recommendations below.    Food Choices for Gastroesophageal Reflux Disease, Adult When you have gastroesophageal reflux disease (GERD), the foods you eat and your eating habits are very important. Choosing the right foods can help ease your discomfort. What guidelines do I need to follow?  Choose fruits, vegetables, whole grains, and low-fat dairy products.  Choose low-fat meat, fish, and poultry.  Limit fats such as oils, salad dressings, butter, nuts, and avocado.  Keep a food diary. This helps you identify foods that cause symptoms.  Avoid foods that cause symptoms. These may be different for everyone.  Eat small meals often instead of 3 large meals a day.  Eat your meals slowly, in a place where you are relaxed.  Limit fried foods.  Cook foods using methods other than frying.  Avoid drinking alcohol.  Avoid drinking large amounts of liquids with your meals.  Avoid bending over or lying down until 2-3 hours after eating. What foods are not recommended? These are some foods and drinks that may make your symptoms worse: Vegetables Tomatoes. Tomato juice. Tomato and spaghetti sauce. Chili peppers. Onion and garlic. Horseradish. Fruits Oranges, grapefruit, and lemon (fruit and juice). Meats High-fat meats, fish, and poultry. This includes hot dogs, ribs, ham, sausage, salami, and bacon. Dairy Whole milk and chocolate milk. Sour cream. Cream. Butter. Ice cream. Cream cheese. Drinks Coffee and tea. Bubbly (carbonated) drinks or energy drinks. Condiments Hot sauce. Barbecue  sauce. Sweets/Desserts Chocolate and cocoa. Donuts. Peppermint and spearmint. Fats and Oils High-fat foods. This includes [place] fries and potato chips. Other Vinegar. Strong spices. This includes black pepper, white pepper, red pepper, cayenne, curry powder, cloves, ginger, and chili powder. The items listed above may not be a complete list of foods and drinks to avoid. Contact your dietitian for more information. This information is not intended to replace advice given to you by your health care provider. Make sure you discuss any questions you have with your health care provider. Document Released: 03/05/2012 Document Revised: 02/10/2016 Document Reviewed: 07/09/2013 Elsevier Interactive Patient Education  2017 [employer].

## 2018-01-29 ENCOUNTER — Ambulatory Visit
Admission: RE | Admit: 2018-01-29 | Discharge: 2018-01-29 | Disposition: A | Discharge status: Home / Self Care | Payer: PRIVATE HEALTH INSURANCE | Source: Ambulatory Visit | Attending: Physician Assistant | Admitting: Physician Assistant

## 2018-01-29 DIAGNOSIS — N644 Mastodynia: Secondary | ICD-10-CM

## 2018-08-27 ENCOUNTER — Encounter: Payer: PRIVATE HEALTH INSURANCE | Admitting: Physician Assistant

## 2018-09-03 ENCOUNTER — Ambulatory Visit (INDEPENDENT_AMBULATORY_CARE_PROVIDER_SITE_OTHER): Payer: BLUE CROSS/BLUE SHIELD | Admitting: Physician Assistant

## 2018-09-03 ENCOUNTER — Encounter: Payer: Self-pay | Admitting: Physician Assistant

## 2018-09-03 ENCOUNTER — Other Ambulatory Visit: Payer: Self-pay

## 2018-09-03 VITALS — BP 120/76 | HR 84 | Temp 97.9°F | Resp 16 | Ht 63.75 in | Wt 255.0 lb

## 2018-09-03 DIAGNOSIS — F32A Depression, unspecified: Secondary | ICD-10-CM

## 2018-09-03 DIAGNOSIS — Z0001 Encounter for general adult medical examination with abnormal findings: Secondary | ICD-10-CM | POA: Diagnosis not present

## 2018-09-03 DIAGNOSIS — F329 Major depressive disorder, single episode, unspecified: Secondary | ICD-10-CM

## 2018-09-03 DIAGNOSIS — Z720 Tobacco use: Secondary | ICD-10-CM | POA: Diagnosis not present

## 2018-09-03 DIAGNOSIS — Z23 Encounter for immunization: Secondary | ICD-10-CM | POA: Insufficient documentation

## 2018-09-03 DIAGNOSIS — Z87891 Personal history of nicotine dependence: Secondary | ICD-10-CM | POA: Insufficient documentation

## 2018-09-03 DIAGNOSIS — L989 Disorder of the skin and subcutaneous tissue, unspecified: Secondary | ICD-10-CM | POA: Insufficient documentation

## 2018-09-03 DIAGNOSIS — Z0000 Encounter for general adult medical examination without abnormal findings: Secondary | ICD-10-CM

## 2018-09-03 LAB — CBC WITH DIFFERENTIAL/PLATELET
BASOS PCT: 0.6 % (ref 0.0–3.0)
Basophils Absolute: 0 10*3/uL (ref 0.0–0.1)
EOS PCT: 1 % (ref 0.0–5.0)
Eosinophils Absolute: 0.1 10*3/uL (ref 0.0–0.7)
HEMATOCRIT: 43 % (ref 36.0–46.0)
HEMOGLOBIN: 13.9 g/dL (ref 12.0–15.0)
LYMPHS PCT: 30.2 % (ref 12.0–46.0)
Lymphs Abs: 2.4 10*3/uL (ref 0.7–4.0)
MCHC: 32.3 g/dL (ref 30.0–36.0)
MCV: 85.5 fl (ref 78.0–100.0)
MONO ABS: 0.4 10*3/uL (ref 0.1–1.0)
Monocytes Relative: 4.3 % (ref 3.0–12.0)
Neutro Abs: 5.2 10*3/uL (ref 1.4–7.7)
Neutrophils Relative %: 63.9 % (ref 43.0–77.0)
PLATELETS: 229 10*3/uL (ref 150.0–400.0)
RBC: 5.03 Mil/uL (ref 3.87–5.11)
RDW: 13.4 % (ref 11.5–15.5)
WBC: 8.1 10*3/uL (ref 4.0–10.5)

## 2018-09-03 LAB — LIPID PANEL
CHOL/HDL RATIO: 3
Cholesterol: 150 mg/dL (ref 0–200)
HDL: 49.1 mg/dL (ref >39.00)
LDL Cholesterol: 89 mg/dL (ref 0–99)
NonHDL: 101.33
Triglycerides: 63 mg/dL (ref 0.0–149.0)
VLDL: 12.6 mg/dL (ref 0.0–40.0)

## 2018-09-03 LAB — COMPREHENSIVE METABOLIC PANEL
ALBUMIN: 3.8 g/dL (ref 3.5–5.2)
ALT: 16 U/L (ref 0–35)
AST: 18 U/L (ref 0–37)
Alkaline Phosphatase: 106 U/L (ref 39–117)
BUN: 11 mg/dL (ref 6–23)
CHLORIDE: 104 meq/L (ref 96–112)
CO2: 26 meq/L (ref 19–32)
Calcium: 9 mg/dL (ref 8.4–10.5)
Creatinine, Ser: 0.81 mg/dL (ref 0.40–1.20)
GFR: 98.12 mL/min (ref >60.00)
Glucose, Bld: 80 mg/dL (ref 70–99)
POTASSIUM: 4.1 meq/L (ref 3.5–5.1)
Sodium: 137 mEq/L (ref 135–145)
Total Bilirubin: 0.3 mg/dL (ref 0.2–1.2)
Total Protein: 6.9 g/dL (ref 6.0–8.3)

## 2018-09-03 LAB — TSH: TSH: 1.89 u[IU]/mL (ref 0.35–4.50)

## 2018-09-03 LAB — HEMOGLOBIN A1C: Hgb A1c MFr Bld: 5.8 % (ref 4.6–6.5)

## 2018-09-03 MED ORDER — BUPROPION HCL ER (XL) 150 MG PO TB24: 150.0000 mg | ORAL_TABLET | Freq: Every day | ORAL | 1 refills | Status: DC

## 2018-09-03 NOTE — Assessment & Plan Note (Signed)
Depression screen negative. Health Maintenance reviewed. Preventive schedule discussed and handout given in AVS. Will obtain fasting labs today.  

## 2018-09-03 NOTE — Assessment & Plan Note (Signed)
Consistent with benign viral wart. (patient with + history). UTD on PAP smears and pelvic exam. Cryotherapy applied. Supportive measures reviewed.

## 2018-09-03 NOTE — Assessment & Plan Note (Signed)
After discussion of treatment, she has elected to start Wellbutrin at a low dose. This will hopefully help with both mood and tobacco use. Close follow-up scheduled.

## 2018-09-03 NOTE — Patient Instructions (Signed)
-Please go to the lab for blood work.  -Our office will call you with your results unless you have chosen to receive results via MyChart. -If your blood work is normal we will follow-up each year for physicals and as scheduled for chronic medical problems. -If anything is abnormal we will treat accordingly and get you in for a follow-up.  Please start the Wellbutrin, taking as directed. Follow-up in 1 month.  The skin lesions should crust over and fall off. Occasionally more than one treatment is needed. We will reassess at follow-up.    Preventive Care 40-64 Years, Female Preventive care refers to lifestyle choices and visits with your health care provider that can promote health and wellness. What does preventive care include?  A yearly physical exam. This is also called an annual well check.  Dental exams once or twice a year.  Routine eye exams. Ask your health care provider how often you should have your eyes checked.  Personal lifestyle choices, including: ? Daily care of your teeth and gums. ? Regular physical activity. ? Eating a healthy diet. ? Avoiding tobacco and drug use. ? Limiting alcohol use. ? Practicing safe sex. ? Taking low-dose aspirin daily starting at age 52. ? Taking vitamin and mineral supplements as recommended by your health care provider. What happens during an annual well check? The services and screenings done by your health care provider during your annual well check will depend on your age, overall health, lifestyle risk factors, and family history of disease. Counseling Your health care provider may ask you questions about your:  Alcohol use.  Tobacco use.  Drug use.  Emotional well-being.  Home and relationship well-being.  Sexual activity.  Eating habits.  Work and work Statistician.  Method of birth control.  Menstrual cycle.  Pregnancy history.  Screening You may have the following tests or measurements:  Height, weight,  and BMI.  Blood pressure.  Lipid and cholesterol levels. These may be checked every 5 years, or more frequently if you are over 51 years old.  Skin check.  Lung cancer screening. You may have this screening every year starting at age 29 if you have a 30-pack-year history of smoking and currently smoke or have quit within the past 15 years.  Fecal occult blood test (FOBT) of the stool. You may have this test every year starting at age 69.  Flexible sigmoidoscopy or colonoscopy. You may have a sigmoidoscopy every 5 years or a colonoscopy every 10 years starting at age 13.  Hepatitis C blood test.  Hepatitis B blood test.  Sexually transmitted disease (STD) testing.  Diabetes screening. This is done by checking your blood sugar (glucose) after you have not eaten for a while (fasting). You may have this done every 1-3 years.  Mammogram. This may be done every 1-2 years. Talk to your health care provider about when you should start having regular mammograms. This may depend on whether you have a family history of breast cancer.  BRCA-related cancer screening. This may be done if you have a family history of breast, ovarian, tubal, or peritoneal cancers.  Pelvic exam and Pap test. This may be done every 3 years starting at age 98. Starting at age 69, this may be done every 5 years if you have a Pap test in combination with an HPV test.  Bone density scan. This is done to screen for osteoporosis. You may have this scan if you are at high risk for osteoporosis.  Discuss your  test results, treatment options, and if necessary, the need for more tests with your health care provider. Vaccines Your health care provider may recommend certain vaccines, such as:  Influenza vaccine. This is recommended every year.  Tetanus, diphtheria, and acellular pertussis (Tdap, Td) vaccine. You may need a Td booster every 10 years.  Varicella vaccine. You may need this if you have not been  vaccinated.  Zoster vaccine. You may need this after age 94.  Measles, mumps, and rubella (MMR) vaccine. You may need at least one dose of MMR if you were born in 1957 or later. You may also need a second dose.  Pneumococcal 13-valent conjugate (PCV13) vaccine. You may need this if you have certain conditions and were not previously vaccinated.  Pneumococcal polysaccharide (PPSV23) vaccine. You may need one or two doses if you smoke cigarettes or if you have certain conditions.  Meningococcal vaccine. You may need this if you have certain conditions.  Hepatitis A vaccine. You may need this if you have certain conditions or if you travel or work in places where you may be exposed to hepatitis A.  Hepatitis B vaccine. You may need this if you have certain conditions or if you travel or work in places where you may be exposed to hepatitis B.  Haemophilus influenzae type b (Hib) vaccine. You may need this if you have certain conditions.  Talk to your health care provider about which screenings and vaccines you need and how often you need them. This information is not intended to replace advice given to you by your health care provider. Make sure you discuss any questions you have with your health care provider. Document Released: 10/01/2015 Document Revised: 05/24/2016 Document Reviewed: 07/06/2015 Elsevier Interactive Patient Education  [employer].

## 2018-09-03 NOTE — Progress Notes (Signed)
Patient presents to clinic today for annual exam.  Patient is fasting for labs.  Acute Concerns: Patient endorses bump of Right inguinal region noted a few months ago. Is non painful or pruritic but sometimes gets irritated by clothing. Would like this checked today.  Chronic Issues: Depression -- Intermittently over the past few years. Has noted depressed mood with anhedonia over the past few months. Is being very hard on herself in regards to resuming smoking (had stopped completely for > 1 year). Notes multiple contributing stressors including financial burdens and marital issues. Denies SI/HI.   Health Maintenance: Immunizations -- Due for flu shot. Agrees to get today. Mammogram -- up-to-date PAP -- up-to-date.  Past Medical History:  Diagnosis Date  . Chicken pox   . 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 Elevated blood pressure   . Fibroids   . GERD (gastroesophageal reflux disease)   . 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 Hypertension   . Hypolipidemia   . Ovarian cyst   . STD (sexually transmitted disease)    gc over 68yrs ago  . UTI (lower urinary tract infection)     Past Surgical History:  Procedure Laterality Date  . FOOT SURGERY Right    bone spur  . WISDOM TOOTH EXTRACTION      Current Outpatient Medications on File Prior to Visit  Medication Sig Dispense Refill  . pantoprazole (PROTONIX) 40 MG tablet Take 1 tablet (40 mg total) by mouth daily. 30 tablet 3   No current facility-administered medications on file prior to visit.     No Known Allergies  Family History  Problem Relation Age of Onset  . Alcoholism Father   . Drug abuse Father   . Prostate cancer Father   . Hypertension Mother   . Diabetes Mother   . Hyperlipidemia Mother   . Heart murmur Mother   . Alcoholism Maternal Grandfather   . Dementia Maternal Grandfather   . Arthritis Maternal  Grandmother   . Breast cancer Maternal Grandmother 60  . Stroke Maternal Grandmother   . Hypertension Maternal Grandmother   . Stroke Maternal Aunt   . Colon cancer Maternal Aunt   . Hyperlipidemia Maternal Aunt   . Alcoholism Maternal Uncle        x2  . Alcoholism Maternal Aunt   . Aneurysm Maternal Aunt   . Hypertension Maternal Aunt        x2  . Stroke Brother   . Drug abuse Brother   . Heart disease Brother   . Bone cancer Paternal Aunt     Review of Systems  Constitutional: Negative for fever and weight loss.  HENT: Negative for ear discharge, ear pain, hearing loss and tinnitus.   Eyes: Negative for blurred vision, double vision, photophobia and pain.  Respiratory: Negative for cough and shortness of breath.   Cardiovascular: Negative for chest pain and palpitations.  Gastrointestinal: Negative for abdominal pain, blood in stool, constipation, diarrhea, heartburn, melena, nausea and vomiting.  Genitourinary: Negative for dysuria, flank pain, frequency, hematuria and urgency.  Musculoskeletal: Negative for falls.  Neurological: Negative for dizziness, loss of consciousness and headaches.  Endo/Heme/Allergies: Negative for environmental allergies.  Psychiatric/Behavioral: Negative for hallucinations, substance abuse and suicidal ideas. The patient is not nervous/anxious and does not have insomnia.    BP 120/76   Pulse 84   Temp 97.9 F (36.6 C) (  Oral)   Resp 16   Ht 5' 3.75" (1.619 m)   Wt 255 lb (115.7 kg)   BMI 44.11 kg/m   Physical Exam Vitals signs reviewed.  HENT:     Head: Normocephalic and atraumatic.     Right Ear: Tympanic membrane, ear canal and external ear normal.     Left Ear: Tympanic membrane, ear canal and external ear normal.     Nose: Nose normal. No mucosal edema.     Mouth/Throat:     Pharynx: Uvula midline. No oropharyngeal exudate or posterior oropharyngeal erythema.  Eyes:     Conjunctiva/sclera: Conjunctivae normal.     Pupils: Pupils  are equal, round, and reactive to light.  Neck:     Musculoskeletal: Neck supple.     Thyroid: No thyromegaly.  Cardiovascular:     Rate and Rhythm: Normal rate and regular rhythm.     Heart sounds: Normal heart sounds.  Pulmonary:     Effort: Pulmonary effort is normal. No respiratory distress.     Breath sounds: Normal breath sounds. No wheezing or rales.  Abdominal:     General: Bowel sounds are normal. There is no distension.     Palpations: Abdomen is soft. There is no mass.     Tenderness: There is no abdominal tenderness. There is no guarding or rebound.  Lymphadenopathy:     Cervical: No cervical adenopathy.  Skin:    General: Skin is warm and dry.     Findings: No rash.       Neurological:     Mental Status: She is alert and oriented to person, place, and time.     Cranial Nerves: No cranial nerve deficit.    Assessment/Plan: Visit for preventive health examination Depression screen negative. Health Maintenance reviewed. Preventive schedule discussed and handout given in AVS. Will obtain fasting labs today.   Need for immunization against influenza Flu shot updated today.  Skin lesion Consistent with benign viral wart. (patient with + history). UTD on PAP smears and pelvic exam. Cryotherapy applied. Supportive measures reviewed.   Depression After discussion of treatment, she has elected to start Wellbutrin at a low dose. This will hopefully help with both mood and tobacco use. Close follow-up scheduled.   Tobacco abuse Restarted after > 1 year of cessation. Discussed stress relief tactics. Patient has elected to attempt trial of Wellbutrin XL. Start at 150 mg. Follow-up scheduled. May need to increase to 300 mg daily.     Leeanne Rio, PA-C

## 2018-09-03 NOTE — Assessment & Plan Note (Signed)
Restarted after > 1 year of cessation. Discussed stress relief tactics. Patient has elected to attempt trial of Wellbutrin XL. Start at 150 mg. Follow-up scheduled. May need to increase to 300 mg daily.

## 2018-09-03 NOTE — Assessment & Plan Note (Signed)
Flu shot updated today.

## 2018-09-04 ENCOUNTER — Encounter: Payer: Self-pay | Admitting: Emergency Medicine

## 2018-09-18 ENCOUNTER — Other Ambulatory Visit: Payer: Self-pay | Admitting: Physician Assistant

## 2018-09-18 DIAGNOSIS — K219 Gastro-esophageal reflux disease without esophagitis: Secondary | ICD-10-CM

## 2018-10-08 ENCOUNTER — Other Ambulatory Visit: Payer: Self-pay | Admitting: Physician Assistant

## 2018-10-08 DIAGNOSIS — Z720 Tobacco use: Secondary | ICD-10-CM

## 2018-10-08 DIAGNOSIS — F32A Depression, unspecified: Secondary | ICD-10-CM

## 2018-10-08 DIAGNOSIS — F329 Major depressive disorder, single episode, unspecified: Secondary | ICD-10-CM

## 2018-10-21 ENCOUNTER — Ambulatory Visit: Payer: BLUE CROSS/BLUE SHIELD | Admitting: Physician Assistant

## 2018-12-16 ENCOUNTER — Other Ambulatory Visit: Payer: Self-pay | Admitting: Physician Assistant

## 2018-12-16 DIAGNOSIS — K219 Gastro-esophageal reflux disease without esophagitis: Secondary | ICD-10-CM

## 2018-12-19 ENCOUNTER — Other Ambulatory Visit: Payer: Self-pay

## 2018-12-23 ENCOUNTER — Other Ambulatory Visit: Payer: Self-pay

## 2018-12-23 ENCOUNTER — Ambulatory Visit (INDEPENDENT_AMBULATORY_CARE_PROVIDER_SITE_OTHER): Payer: BLUE CROSS/BLUE SHIELD | Admitting: Women's Health

## 2018-12-23 ENCOUNTER — Encounter: Payer: Self-pay | Admitting: Women's Health

## 2018-12-23 VITALS — BP 124/80 | Ht 63.0 in | Wt 254.0 lb

## 2018-12-23 DIAGNOSIS — Z01419 Encounter for gynecological examination (general) (routine) without abnormal findings: Secondary | ICD-10-CM

## 2018-12-23 DIAGNOSIS — Z1151 Encounter for screening for human papillomavirus (HPV): Secondary | ICD-10-CM

## 2018-12-23 NOTE — Patient Instructions (Addendum)
Vit d 2000 iu daily  Carbohydrate Counting for Diabetes Mellitus, Adult  Carbohydrate counting is a method of keeping track of how many carbohydrates you eat. Eating carbohydrates naturally increases the amount of sugar (glucose) in the blood. Counting how many carbohydrates you eat helps keep your blood glucose within normal limits, which helps you manage your diabetes (diabetes mellitus). It is important to know how many carbohydrates you can safely have in each meal. This is different for every person. A diet and nutrition specialist (registered dietitian) can help you make a meal plan and calculate how many carbohydrates you should have at each meal and snack. Carbohydrates are found in the following foods:  Grains, such as breads and cereals.  Dried beans and soy products.  Starchy vegetables, such as potatoes, peas, and corn.  Fruit and fruit juices.  Milk and yogurt.  Sweets and snack foods, such as cake, cookies, candy, chips, and soft drinks. How do I count carbohydrates? There are two ways to count carbohydrates in food. You can use either of the methods or a combination of both. Reading "Nutrition Facts" on packaged food The "Nutrition Facts" list is included on the labels of almost all packaged foods and beverages in the [place] It includes:  The serving size.  Information about nutrients in each serving, including the grams (g) of carbohydrate per serving. To use the "Nutrition Facts":  Decide how many servings you will have.  Multiply the number of servings by the number of carbohydrates per serving.  The resulting number is the total amount of carbohydrates that you will be having. Learning standard serving sizes of other foods When you eat carbohydrate foods that are not packaged or do not include "Nutrition Facts" on the label, you need to measure the servings in order to count the amount of carbohydrates:  Measure the foods that you will eat with a food scale or  measuring cup, if needed.  Decide how many standard-size servings you will eat.  Multiply the number of servings by 15. Most carbohydrate-rich foods have about 15 g of carbohydrates per serving. ? For example, if you eat 8 oz (170 g) of strawberries, you will have eaten 2 servings and 30 g of carbohydrates (2 servings x 15 g = 30 g).  For foods that have more than one food mixed, such as soups and casseroles, you must count the carbohydrates in each food that is included. The following list contains standard serving sizes of common carbohydrate-rich foods. Each of these servings has about 15 g of carbohydrates:   hamburger bun or  English muffin.   oz (15 mL) syrup.   oz (14 g) jelly.  1 slice of bread.  1 six-inch tortilla.  3 oz (85 g) cooked rice or pasta.  4 oz (113 g) cooked dried beans.  4 oz (113 g) starchy vegetable, such as peas, corn, or potatoes.  4 oz (113 g) hot cereal.  4 oz (113 g) mashed potatoes or  of a large baked potato.  4 oz (113 g) canned or frozen fruit.  4 oz (120 mL) fruit juice.  4-6 crackers.  6 chicken nuggets.  6 oz (170 g) unsweetened dry cereal.  6 oz (170 g) plain fat-free yogurt or yogurt sweetened with artificial sweeteners.  8 oz (240 mL) milk.  8 oz (170 g) fresh fruit or one small piece of fruit.  24 oz (680 g) popped popcorn. Example of carbohydrate counting Sample meal  3 oz (85 g) chicken  breast.  6 oz (170 g) brown rice.  4 oz (113 g) corn.  8 oz (240 mL) milk.  8 oz (170 g) strawberries with sugar-free whipped topping. Carbohydrate calculation 1. Identify the foods that contain carbohydrates: ? Rice. ? Corn. ? Milk. ? Strawberries. 2. Calculate how many servings you have of each food: ? 2 servings rice. ? 1 serving corn. ? 1 serving milk. ? 1 serving strawberries. 3. Multiply each number of servings by 15 g: ? 2 servings rice x 15 g = 30 g. ? 1 serving corn x 15 g = 15 g. ? 1 serving milk x 15 g =  15 g. ? 1 serving strawberries x 15 g = 15 g. 4. Add together all of the amounts to find the total grams of carbohydrates eaten: ? 30 g + 15 g + 15 g + 15 g = 75 g of carbohydrates total. Summary  Carbohydrate counting is a method of keeping track of how many carbohydrates you eat.  Eating carbohydrates naturally increases the amount of sugar (glucose) in the blood.  Counting how many carbohydrates you eat helps keep your blood glucose within normal limits, which helps you manage your diabetes.  A diet and nutrition specialist (registered dietitian) can help you make a meal plan and calculate how many carbohydrates you should have at each meal and snack. This information is not intended to replace advice given to you by your health care provider. Make sure you discuss any questions you have with your health care provider. Document Released: 09/04/2005 Document Revised: 03/14/2017 Document Reviewed: 02/16/2016 Elsevier Interactive Patient Education  2019 [place] Maintenance, Female Adopting a healthy lifestyle and getting preventive care can go a long way to promote health and wellness. Talk with your health care provider about what schedule of regular examinations is right for you. This is a good chance for you to check in with your provider about disease prevention and staying healthy. In between checkups, there are plenty of things you can do on your own. Experts have done a lot of research about which lifestyle changes and preventive measures are most likely to keep you healthy. Ask your health care provider for more information. Weight and diet Eat a healthy diet  Be sure to include plenty of vegetables, fruits, low-fat dairy products, and lean protein.  Do not eat a lot of foods high in solid fats, added sugars, or salt.  Get regular exercise. This is one of the most important things you can do for your health. ? Most adults should exercise for at least 150 minutes  each week. The exercise should increase your heart rate and make you sweat (moderate-intensity exercise). ? Most adults should also do strengthening exercises at least twice a week. This is in addition to the moderate-intensity exercise. Maintain a healthy weight  Body mass index (BMI) is a measurement that can be used to identify possible weight problems. It estimates body fat based on height and weight. Your health care provider can help determine your BMI and help you achieve or maintain a healthy weight.  For females 36 years of age and older: ? A BMI below 18.5 is considered underweight. ? A BMI of 18.5 to 24.9 is normal. ? A BMI of 25 to 29.9 is considered overweight. ? A BMI of 30 and above is considered obese. Watch levels of cholesterol and blood lipids  You should start having your blood tested for lipids and cholesterol at 46 years of  age, then have this test every 5 years.  You may need to have your cholesterol levels checked more often if: ? Your lipid or cholesterol levels are high. ? You are older than 46 years of age. ? You are at high risk for heart disease. Cancer screening Lung Cancer  Lung cancer screening is recommended for adults 67-61 years old who are at high risk for lung cancer because of a history of smoking.  A yearly low-dose CT scan of the lungs is recommended for people who: ? Currently smoke. ? Have quit within the past 15 years. ? Have at least a 30-pack-year history of smoking. A pack year is smoking an average of one pack of cigarettes a day for 1 year.  Yearly screening should continue until it has been 15 years since you quit.  Yearly screening should stop if you develop a health problem that would prevent you from having lung cancer treatment. Breast Cancer  Practice breast self-awareness. This means understanding how your breasts normally appear and feel.  It also means doing regular breast self-exams. Let your health care provider know about  any changes, no matter how small.  If you are in your 20s or 30s, you should have a clinical breast exam (CBE) by a health care provider every 1-3 years as part of a regular health exam.  If you are 34 or older, have a CBE every year. Also consider having a breast X-ray (mammogram) every year.  If you have a family history of breast cancer, talk to your health care provider about genetic screening.  If you are at high risk for breast cancer, talk to your health care provider about having an MRI and a mammogram every year.  Breast cancer gene (BRCA) assessment is recommended for women who have family members with BRCA-related cancers. BRCA-related cancers include: ? Breast. ? Ovarian. ? Tubal. ? Peritoneal cancers.  Results of the assessment will determine the need for genetic counseling and BRCA1 and BRCA2 testing. Cervical Cancer Your health care provider may recommend that you be screened regularly for cancer of the pelvic organs (ovaries, uterus, and vagina). This screening involves a pelvic examination, including checking for microscopic changes to the surface of your cervix (Pap test). You may be encouraged to have this screening done every 3 years, beginning at age 39.  For women ages 33-65, health care providers may recommend pelvic exams and Pap testing every 3 years, or they may recommend the Pap and pelvic exam, combined with testing for human papilloma virus (HPV), every 5 years. Some types of HPV increase your risk of cervical cancer. Testing for HPV may also be done on women of any age with unclear Pap test results.  Other health care providers may not recommend any screening for nonpregnant women who are considered low risk for pelvic cancer and who do not have symptoms. Ask your health care provider if a screening pelvic exam is right for you.  If you have had past treatment for cervical cancer or a condition that could lead to cancer, you need Pap tests and screening for  cancer for at least 20 years after your treatment. If Pap tests have been discontinued, your risk factors (such as having a new sexual partner) need to be reassessed to determine if screening should resume. Some women have medical problems that increase the chance of getting cervical cancer. In these cases, your health care provider may recommend more frequent screening and Pap tests. Colorectal Cancer  This type  of cancer can be detected and often prevented.  Routine colorectal cancer screening usually begins at 46 years of age and continues through 46 years of age.  Your health care provider may recommend screening at an earlier age if you have risk factors for colon cancer.  Your health care provider may also recommend using home test kits to check for hidden blood in the stool.  A small camera at the end of a tube can be used to examine your colon directly (sigmoidoscopy or colonoscopy). This is done to check for the earliest forms of colorectal cancer.  Routine screening usually begins at age 25.  Direct examination of the colon should be repeated every 5-10 years through 46 years of age. However, you may need to be screened more often if early forms of precancerous polyps or small growths are found. Skin Cancer  Check your skin from head to toe regularly.  Tell your health care provider about any new moles or changes in moles, especially if there is a change in a mole's shape or color.  Also tell your health care provider if you have a mole that is larger than the size of a pencil eraser.  Always use sunscreen. Apply sunscreen liberally and repeatedly throughout the day.  Protect yourself by wearing long sleeves, pants, a wide-brimmed hat, and sunglasses whenever you are outside. Heart disease, diabetes, and high blood pressure  High blood pressure causes heart disease and increases the risk of stroke. High blood pressure is more likely to develop in: ? People who have blood  pressure in the high end of the normal range (130-139/85-89 mm Hg). ? People who are overweight or obese. ? People who are African American.  If you are 68-21 years of age, have your blood pressure checked every 3-5 years. If you are 32 years of age or older, have your blood pressure checked every year. You should have your blood pressure measured twice-once when you are at a hospital or clinic, and once when you are not at a hospital or clinic. Record the average of the two measurements. To check your blood pressure when you are not at a hospital or clinic, you can use: ? An automated blood pressure machine at a pharmacy. ? A home blood pressure monitor.  If you are between 33 years and 85 years old, ask your health care provider if you should take aspirin to prevent strokes.  Have regular diabetes screenings. This involves taking a blood sample to check your fasting blood sugar level. ? If you are at a normal weight and have a low risk for diabetes, have this test once every three years after 46 years of age. ? If you are overweight and have a high risk for diabetes, consider being tested at a younger age or more often. Preventing infection Hepatitis B  If you have a higher risk for hepatitis B, you should be screened for this virus. You are considered at high risk for hepatitis B if: ? You were born in a country where hepatitis B is common. Ask your health care provider which countries are considered high risk. ? Your parents were born in a high-risk country, and you have not been immunized against hepatitis B (hepatitis B vaccine). ? You have HIV or AIDS. ? You use needles to inject street drugs. ? You live with someone who has hepatitis B. ? You have had sex with someone who has hepatitis B. ? You get hemodialysis treatment. ? You take certain  medicines for conditions, including cancer, organ transplantation, and autoimmune conditions. Hepatitis C  Blood testing is recommended  for: ? Everyone born from [birth year] through 1963. ? Anyone with known risk factors for hepatitis C. Sexually transmitted infections (STIs)  You should be screened for sexually transmitted infections (STIs) including gonorrhea and chlamydia if: ? You are sexually active and are younger than 46 years of age. ? You are older than 46 years of age and your health care provider tells you that you are at risk for this type of infection. ? Your sexual activity has changed since you were last screened and you are at an increased risk for chlamydia or gonorrhea. Ask your health care provider if you are at risk.  If you do not have HIV, but are at risk, it may be recommended that you take a prescription medicine daily to prevent HIV infection. This is called pre-exposure prophylaxis (PrEP). You are considered at risk if: ? You are sexually active and do not regularly use condoms or know the HIV status of your partner(s). ? You take drugs by injection. ? You are sexually active with a partner who has HIV. Talk with your health care provider about whether you are at high risk of being infected with HIV. If you choose to begin PrEP, you should first be tested for HIV. You should then be tested every 3 months for as long as you are taking PrEP. Pregnancy  If you are premenopausal and you may become pregnant, ask your health care provider about preconception counseling.  If you may become pregnant, take 400 to 800 micrograms (mcg) of folic acid every day.  If you want to prevent pregnancy, talk to your health care provider about birth control (contraception). Osteoporosis and menopause  Osteoporosis is a disease in which the bones lose minerals and strength with aging. This can result in serious bone fractures. Your risk for osteoporosis can be identified using a bone density scan.  If you are 12 years of age or older, or if you are at risk for osteoporosis and fractures, ask your health care provider if you  should be screened.  Ask your health care provider whether you should take a calcium or vitamin D supplement to lower your risk for osteoporosis.  Menopause may have certain physical symptoms and risks.  Hormone replacement therapy may reduce some of these symptoms and risks. Talk to your health care provider about whether hormone replacement therapy is right for you. Follow these instructions at home:  Schedule regular health, dental, and eye exams.  Stay current with your immunizations.  Do not use any tobacco products including cigarettes, chewing tobacco, or electronic cigarettes.  If you are pregnant, do not drink alcohol.  If you are breastfeeding, limit how much and how often you drink alcohol.  Limit alcohol intake to no more than 1 drink per day for nonpregnant women. One drink equals 12 ounces of beer, 5 ounces of wine, or 1 ounces of hard liquor.  Do not use street drugs.  Do not share needles.  Ask your health care provider for help if you need support or information about quitting drugs.  Tell your health care provider if you often feel depressed.  Tell your health care provider if you have ever been abused or do not feel safe at home. This information is not intended to replace advice given to you by your health care provider. Make sure you discuss any questions you have with your health care provider.  Document Released: 03/20/2011 Document Revised: 02/10/2016 Document Reviewed: 06/08/2015 Elsevier Interactive Patient Education  2019 [employer].

## 2018-12-23 NOTE — Addendum Note (Signed)
Addended by: Nelva Nay on: 12/23/2018 12:47 PM   Modules accepted: Orders

## 2018-12-23 NOTE — Progress Notes (Signed)
Nicole Mueller 1971/06/14 [id number]    History:    Presents for annual exam.  Monthly cycle, using no contraception states has not conceived since her daughter 25 years ago..  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  History of 2 small asymptomatic fibroids.  Normal mammogram history, 2019 normal after ultrasound.  Past medical history, past surgical history, family history and social history were all reviewed and documented in the EPIC chart.  Works at [employer], currently out of work.  Numerous family members with diabetes and hypertension.  Daughter 54 doing well.  ROS:  A ROS was performed and pertinent positives and negatives are included.  Exam:  Vitals:   12/23/18 1130  BP: 124/80  Weight: 254 lb (115.2 kg)  Height: 5\' 3"  (1.6 m)   Body mass index is 44.99 kg/m.   General appearance:  Normal Thyroid:  Symmetrical, normal in size, without palpable masses or nodularity. Respiratory  Auscultation:  Clear without wheezing or rhonchi Cardiovascular  Auscultation:  Regular rate, without rubs, murmurs or gallops  Edema/varicosities:  Not grossly evident Abdominal  Soft,nontender, without masses, guarding or rebound.  Liver/spleen:  No organomegaly noted  Hernia:  None appreciated  Skin  Inspection:  Grossly normal   Breasts: Examined lying and sitting. Pendulous, upper back and neck pain.    Right: Without masses, retractions, discharge or axillary adenopathy.     Left: Small 2cm sebaceous cyst lower breast at 6 o'clock position without retractions, discharge or axillary adenopathy. Gentitourinary   Inguinal/mons:  Normal without inguinal adenopathy  External genitalia:  Normal  BUS/Urethra/Skene's glands:  Normal  Vagina:  Normal  Cervix:  Normal  Uterus:  normal in size, shape and contour.  Midline and mobile  Adnexa/parametria:     Rt: Without masses or tenderness.   Lt: Without masses or  tenderness.  Anus and perineum: Normal  Digital rectal exam: Normal sphincter tone without palpated masses or tenderness  Assessment/Plan:  48 y.o. MBF G3, P1 +1 step daughter for annual exam with no complaints.  Monthly cycles/using no contraception 2016 normal Pap with positive high-risk HPV and normal Paps after. Morbid obesity Pendulous breasts Primary care manages labs Smoker  Plan: Contraception options reviewed, declines states has not conceived after 25 years.  SBEs, continue annual screening mammogram, calcium rich foods, vitamin D 2000 daily encouraged.  Reviewed importance of increasing weightbearing and balance type exercise, decreasing calorie/carbs.  Aware of hazards of smoking reviewed importance of decreasing/quitting, tips to quit reviewed.  Pap with HR HPV typing.   [place], 11:36 AM 12/23/2018

## 2018-12-24 LAB — PAP, TP IMAGING W/ HPV RNA, RFLX HPV TYPE 16,18/45: HPV DNA High Risk: NOT DETECTED

## 2019-01-15 ENCOUNTER — Encounter: Payer: Self-pay | Admitting: Physician Assistant

## 2019-01-15 ENCOUNTER — Ambulatory Visit (INDEPENDENT_AMBULATORY_CARE_PROVIDER_SITE_OTHER): Payer: BLUE CROSS/BLUE SHIELD | Admitting: Physician Assistant

## 2019-01-15 ENCOUNTER — Other Ambulatory Visit: Payer: Self-pay

## 2019-01-15 VITALS — Ht 63.75 in | Wt 249.8 lb

## 2019-01-15 DIAGNOSIS — K219 Gastro-esophageal reflux disease without esophagitis: Secondary | ICD-10-CM

## 2019-01-15 DIAGNOSIS — Z720 Tobacco use: Secondary | ICD-10-CM

## 2019-01-15 NOTE — Assessment & Plan Note (Signed)
>>  ASSESSMENT AND PLAN FOR GERD WITH ESOPHAGITIS WRITTEN ON 01/15/2019  8:56 AM BY Waldon Merl, PA-C  Doing very well with daily Omeprazole. Avoiding dietary triggers but her smoking and BMI are likely contributors. Will continue Omeprazole for now. Refills on current Rx. We are working on diet/exercise for weight loss and medication to help with smoking.

## 2019-01-15 NOTE — Progress Notes (Signed)
I have discussed the procedure for the virtual visit with the patient who has given consent to proceed with assessment and treatment.   Ayako Tapanes S Lorielle Boehning, CMA     

## 2019-01-15 NOTE — Assessment & Plan Note (Signed)
Will restart Wellbutrin XL 150 mg daily for 2 weeks. She still has in-date tablets at home and will take one of these daily. Will reassess in 2 weeks so and increase dose at that time.

## 2019-01-15 NOTE — Assessment & Plan Note (Signed)
Body mass index is 43.22 kg/m. Reviewed dietary and exercise recommendations. She is trying to walk 30 minutes a day but just started this. She knows first goal is at least 150 minutes of moderate-intensity aerobic activity per week. Will monitor.

## 2019-01-15 NOTE — Patient Instructions (Signed)
Instructions sent to MyChart.  Please keep up with your daily Omeprazole. I am glad this is working for you. Keep up with avoiding trigger foods (see dietary handout below). Avoid late-night eating. The smoking and weight are likely contributing to your symptoms. Restart the Wellbutrin to help with smoking cessation as discussed. Follow-up with me via MyChart or phone in 2 weeks to let me know how it is going. We can decide to continue current dose or increase at that time. Try to get in 150 minutes of aerobic activity per week. Try to keep a daily food/exercise journal so you can track what you are doing!  It was very nice talking to you today!  Also for the scalp, try to apply some witch hazel astringent to the area as it is soothing and helps with itch. If symptoms not resolving, please come see me so we can do a good examination of the scalp.    Food Choices for Gastroesophageal Reflux Disease, Adult When you have gastroesophageal reflux disease (GERD), the foods you eat and your eating habits are very important. Choosing the right foods can help ease your discomfort. Think about working with a nutrition specialist (dietitian) to help you make good choices. What are tips for following this plan?  Meals  Choose healthy foods that are low in fat, such as fruits, vegetables, whole grains, low-fat dairy products, and lean meat, fish, and poultry.  Eat small meals often instead of 3 large meals a day. Eat your meals slowly, and in a place where you are relaxed. Avoid bending over or lying down until 2-3 hours after eating.  Avoid eating meals 2-3 hours before bed.  Avoid drinking a lot of liquid with meals.  Cook foods using methods other than frying. Bake, grill, or broil food instead.  Avoid or limit: ? Chocolate. ? Peppermint or spearmint. ? Alcohol. ? Pepper. ? Black and decaffeinated coffee. ? Black and decaffeinated tea. ? Bubbly (carbonated) soft drinks. ? Caffeinated energy  drinks and soft drinks.  Limit high-fat foods such as: ? Fatty meat or fried foods. ? Whole milk, cream, butter, or ice cream. ? Nuts and nut butters. ? Pastries, donuts, and sweets made with butter or shortening.  Avoid foods that cause symptoms. These foods may be different for everyone. Common foods that cause symptoms include: ? Tomatoes. ? Oranges, lemons, and limes. ? Peppers. ? Spicy food. ? Onions and garlic. ? Vinegar. Lifestyle  Maintain a healthy weight. Ask your doctor what weight is healthy for you. If you need to lose weight, work with your doctor to do so safely.  Exercise for at least 30 minutes for 5 or more days each week, or as told by your doctor.  Wear loose-fitting clothes.  Do not smoke. If you need help quitting, ask your doctor.  Sleep with the head of your bed higher than your feet. Use a wedge under the mattress or blocks under the bed frame to raise the head of the bed. Summary  When you have gastroesophageal reflux disease (GERD), food and lifestyle choices are very important in easing your symptoms.  Eat small meals often instead of 3 large meals a day. Eat your meals slowly, and in a place where you are relaxed.  Limit high-fat foods such as fatty meat or fried foods.  Avoid bending over or lying down until 2-3 hours after eating.  Avoid peppermint and spearmint, caffeine, alcohol, and chocolate. This information is not intended to replace advice given to  you by your health care provider. Make sure you discuss any questions you have with your health care provider. Document Released: 03/05/2012 Document Revised: 10/10/2016 Document Reviewed: 10/10/2016 Elsevier Interactive Patient Education  2019 [employer].

## 2019-01-15 NOTE — Assessment & Plan Note (Signed)
Doing very well with daily Omeprazole. Avoiding dietary triggers but her smoking and BMI are likely contributors. Will continue Omeprazole for now. Refills on current Rx. We are working on diet/exercise for weight loss and medication to help with smoking.

## 2019-01-15 NOTE — Progress Notes (Signed)
Virtual Visit via Video   I connected with patient on 01/15/19 at  8:40 AM EDT by a video enabled telemedicine application and verified that I am speaking with the correct person using two identifiers.  Location patient: Home Location provider: [facility], Office Persons participating in the virtual visit: Patient, Provider, Bret [place] (Nicole Mueller)  I discussed the limitations of evaluation and management by telemedicine and the availability of in person appointments. The patient expressed understanding and agreed to proceed.  Subjective:   HPI:   Patient presents via Doxy.Me today to follow-up for chronic medical issues.   GERD: - Patient is currently on a regimen of Omeprazole 20 mg daily. Endorses taking medications as directed. Is avoiding trigger foods. Is hydrating well. Does occasional do some late-night snacking which she knows contributes to her symptoms. Denies abdominal pain, nausea or vomiting. Denies change to bowel habits.   Obesity: - Patient notes she had gotten off the bandwagon with diet and exercise in the past several months. Has recently restarted working on diet and exercise. Is working on walking daily for at least 30 minutes.   Tobacco Use Disorder: - Patient with a 27 pack-year smoking history. Is currently smoking about 4-5 x day. Was on Wellbutrin 150 mg previously which helped her quit but she has resumed since then. Would like to discuss restarting the Wellbutrin.  ROS:   See pertinent positives and negatives per HPI.  Patient Active Problem List   Diagnosis Date Noted  . Depression 09/03/2018  . Tobacco abuse 09/03/2018  . Need for immunization against influenza 09/03/2018  . Skin lesion 09/03/2018  . Breast cancer screening 07/26/2016  . Visit for preventive health examination 07/28/2015  . Rhinitis, allergic 02/26/2015  . Morbid obesity ([place]) 10/17/2013  . Acid reflux 06/04/2013    Social History   Tobacco Use  . Smoking status:  Current Every Day Smoker    Packs/day: 0.50    Years: 27.00    Pack years: 13.50    Types: Cigarettes  . Smokeless tobacco: Never Used  Substance Use Topics  . Alcohol use: Yes    Alcohol/week: 0.0 standard drinks    Comment: Rare    Current Outpatient Medications:  .  omeprazole (PRILOSEC) 20 MG capsule, Take 1 capsule (20 mg total) by mouth daily., Disp: 90 capsule, Rfl: 0  No Known Allergies  Objective:   Ht 5' 3.75" (1.619 m)   Wt 249 lb 12.8 oz (113.3 kg)   BMI 43.22 kg/m   Patient is well-developed, well-nourished in no acute distress.  Resting comfortably at home.  Head is normocephalic, atraumatic.  No labored breathing.  Speech is clear and coherent with logical contest.  Patient is alert and oriented at baseline.   Assessment and Plan:   Acid reflux Doing very well with daily Omeprazole. Avoiding dietary triggers but her smoking and BMI are likely contributors. Will continue Omeprazole for now. Refills on current Rx. We are working on diet/exercise for weight loss and medication to help with smoking.   Morbid obesity Body mass index is 43.22 kg/m. Reviewed dietary and exercise recommendations. She is trying to walk 30 minutes a day but just started this. She knows first goal is at least 150 minutes of moderate-intensity aerobic activity per week. Will monitor.   Tobacco abuse Will restart Wellbutrin XL 150 mg daily for 2 weeks. She still has in-date tablets at home and will take one of these daily. Will reassess in 2 weeks so and increase dose  at that time.      Leeanne Rio, PA-C 01/15/2019

## 2019-02-17 DIAGNOSIS — E8881 Metabolic syndrome: Secondary | ICD-10-CM | POA: Diagnosis not present

## 2019-02-17 DIAGNOSIS — Z6841 Body Mass Index (BMI) 40.0 and over, adult: Secondary | ICD-10-CM | POA: Diagnosis not present

## 2019-02-24 DIAGNOSIS — E7800 Pure hypercholesterolemia, unspecified: Secondary | ICD-10-CM | POA: Diagnosis not present

## 2019-02-24 DIAGNOSIS — Z6841 Body Mass Index (BMI) 40.0 and over, adult: Secondary | ICD-10-CM | POA: Diagnosis not present

## 2019-03-03 ENCOUNTER — Other Ambulatory Visit: Payer: Self-pay | Admitting: Physician Assistant

## 2019-03-03 ENCOUNTER — Encounter: Payer: Self-pay | Admitting: Physician Assistant

## 2019-03-03 DIAGNOSIS — Z1231 Encounter for screening mammogram for malignant neoplasm of breast: Secondary | ICD-10-CM

## 2019-03-03 DIAGNOSIS — Z6841 Body Mass Index (BMI) 40.0 and over, adult: Secondary | ICD-10-CM | POA: Diagnosis not present

## 2019-03-03 DIAGNOSIS — E8881 Metabolic syndrome: Secondary | ICD-10-CM | POA: Diagnosis not present

## 2019-03-04 MED ORDER — BUPROPION HCL ER (XL) 300 MG PO TB24: 300.0000 mg | ORAL_TABLET | Freq: Every day | ORAL | 1 refills | Status: DC

## 2019-03-05 ENCOUNTER — Telehealth: Payer: Self-pay | Admitting: Physician Assistant

## 2019-03-05 ENCOUNTER — Encounter: Payer: Self-pay | Admitting: Physician Assistant

## 2019-03-05 ENCOUNTER — Other Ambulatory Visit: Payer: Self-pay

## 2019-03-05 ENCOUNTER — Ambulatory Visit: Payer: BLUE CROSS/BLUE SHIELD | Admitting: Physician Assistant

## 2019-03-05 VITALS — BP 118/70 | HR 77 | Temp 98.1°F | Resp 16 | Ht 63.75 in | Wt 247.6 lb

## 2019-03-05 DIAGNOSIS — L659 Nonscarring hair loss, unspecified: Secondary | ICD-10-CM

## 2019-03-05 LAB — TESTOSTERONE: Testosterone: 32.56 ng/dL (ref 15.00–40.00)

## 2019-03-05 NOTE — Telephone Encounter (Signed)
Should this be an in office visit?    Copied from [place] [id number]. Topic: Appointment Scheduling - Scheduling Inquiry for Clinic >> Mar 04, 2019  4:05 PM Richardo Priest, [place] wrote: Reason for CRM: Patient is calling in requesting a follow up in 2-3 weeks. Patient is also experiencing hair lose and itches, sore, some redness, on the crown of head. Please advise and call back is [phone].

## 2019-03-05 NOTE — Progress Notes (Signed)
Patient presents to clinic today c/o intermittent pruritus, soreness and decreased hair growth in a defined area of her scalp over the past 1-2 years. Notes she has used several chemicals for hair care in the past, but never noting an issue. Notes area of concern is on the crown of her scalp only. No other areas affected Tried OTC itch cream, Vit E for hair growth, medicated shampoo for itch all with no relief Denies any dry or flaking of the skin. Denies hx of allergies or skin irritation  Past Medical History:  Diagnosis Date  . Chicken pox   . 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 Elevated blood pressure   . Fibroids   . GERD (gastroesophageal reflux disease)   . 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 Hypertension   . Hypolipidemia   . Ovarian cyst   . STD (sexually transmitted disease)    gc over 58yrs ago  . UTI (lower urinary tract infection)     Current Outpatient Medications on File Prior to Visit  Medication Sig Dispense Refill  . buPROPion (WELLBUTRIN XL) 150 MG 24 hr tablet Take 150 mg by mouth daily.    Marland Kitchen omeprazole (PRILOSEC) 20 MG capsule Take 1 capsule (20 mg total) by mouth daily. 90 capsule 0  . buPROPion (WELLBUTRIN XL) 300 MG 24 hr tablet Take 1 tablet (300 mg total) by mouth daily. (Patient not taking: Reported on 03/05/2019) 30 tablet 1  . phentermine 15 MG capsule TAKE 1 CAPSULE BY MOUTH EVERY DAY FOR 14 DAYS     No current facility-administered medications on file prior to visit.     No Known Allergies  Family History  Problem Relation Age of Onset  . Alcoholism Father   . Drug abuse Father   . Prostate cancer Father   . Hypertension Mother   . Diabetes Mother   . Hyperlipidemia Mother   . Heart murmur Mother   . Alcoholism Maternal Grandfather   . Dementia Maternal Grandfather   . Arthritis Maternal Grandmother   . Breast cancer Maternal Grandmother 60   . Stroke Maternal Grandmother   . Hypertension Maternal Grandmother   . Stroke Maternal Aunt   . Colon cancer Maternal Aunt   . Hyperlipidemia Maternal Aunt   . Alcoholism Maternal Uncle        x2  . Alcoholism Maternal Aunt   . Aneurysm Maternal Aunt   . Hypertension Maternal Aunt        x2  . Stroke Brother   . Drug abuse Brother   . Heart disease Brother   . Bone cancer Paternal Aunt     Social History   Socioeconomic History  . Marital status: Married    Spouse name: Not on file  . Number of children: Not on file  . Years of education: Not on file  . Highest education level: Not on file  Occupational History  . Not on file  Social Needs  . Financial resource strain: Not on file  . Food insecurity    Worry: Not on file    Inability: Not on file  . Transportation needs    Medical: Not on file    Non-medical: Not on file  Tobacco Use  . Smoking status: Current Every Day Smoker    Packs/day: 0.50    Years: 27.00    Pack years: 13.50  Types: Cigarettes  . Smokeless tobacco: Never Used  Substance and Sexual Activity  . Alcohol use: Yes    Alcohol/week: 0.0 standard drinks    Comment: Rare  . Drug use: No  . Sexual activity: Yes    Partners: Male    Birth control/protection: None    Comment: 1st intercourse 48 yo- More than 5 partners  Lifestyle  . Physical activity    Days per week: Not on file    Minutes per session: Not on file  . Stress: Not on file  Relationships  . Social Herbalist on phone: Not on file    Gets together: Not on file    Attends religious service: Not on file    Active member of club or organization: Not on file    Attends meetings of clubs or organizations: Not on file    Relationship status: Not on file  Other Topics Concern  . Not on file  Social History Narrative  . Not on file   Review of Systems - See HPI.  All other ROS are negative.  BP 118/70   Pulse 77   Temp 98.1 F (36.7 C) (Skin)   Resp 16   Ht 5'  3.75" (1.619 m)   Wt 247 lb 9.6 oz (112.3 kg)   SpO2 98%   BMI 42.83 kg/m   Physical Exam  Recent Results (from the past 2160 hour(s))  PAP,TP IMGw/HPV RNA,rflx OEUMPNT61,44/31     Status: None   Collection Time: 12/23/18 12:51 PM  Result Value Ref Range   Clinical Information:      Comment: None given   LMP:      Comment: 12/09/18   PREV. PAP:      Comment: NONE GIVEN   PREV. BX:      Comment: NONE GIVEN   HPV DNA Probe-Source      Comment: Cervix   STATEMENT OF ADEQUACY:      Comment: Satisfactory for evaluation. Endocervical/transformation zone component present.    INTERPRETATION/RESULT:      Comment: Negative for intraepithelial lesion or malignancy.   Comment:      Comment: This Pap test has been evaluated with computer assisted technology.    CYTOTECHNOLOGIST:      Comment: EAW, CT(ASCP) CT screening location: [address], [place], [place] [address]    HPV DNA High Risk Not Detected Not Detect    Comment: This test was performed using the APTIMA HPV Assay ([employer] . This assay detects E6/E7 viral messenger RNA (mRNA) from 14 high-risk HPV types (16,18,31,33,35,39,45,51,52,56,58,59,66,68). . The analytical performance characteristics of this assay have been determined by [facility]. The modifications have not been cleared or approved by the FDA. This assay has been validated pursuant to the CLIA regulations and is used for clinical purposes. EXPLANATORY NOTE:  . The Pap is a screening test for cervical cancer. It is  not a diagnostic test and is subject to false negative  and false positive results. It is most reliable when a  satisfactory sample, regularly obtained, is submitted  with relevant clinical findings and history, and when  the Pap result is evaluated along with historic and  current clinical information. .     Assessment/Plan: 1. 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 No sign  of yeast/fungal infection or trichotillomania. Will check Testosterone and DHEA levels today. Will consider treatment with topical medication first. Can also consider spironolactone or propecia. Will await results  first.  - Testosterone - DHEA   Leeanne Rio, PA-C

## 2019-03-05 NOTE — Patient Instructions (Signed)
Please go to the lab today for blood work.  I will call you with your results. We will alter treatment regimen(s) if indicated by your results.   These seems more related to hormone levels but we are checking this about in more detail before starting treatment.

## 2019-03-05 NOTE — Telephone Encounter (Signed)
Pt is scheduled °

## 2019-03-05 NOTE — Telephone Encounter (Signed)
[place] for OV if she passes screenings

## 2019-03-09 LAB — DHEA: DHEA: 103 ng/dL

## 2019-03-10 DIAGNOSIS — K219 Gastro-esophageal reflux disease without esophagitis: Secondary | ICD-10-CM | POA: Diagnosis not present

## 2019-03-10 DIAGNOSIS — E8881 Metabolic syndrome: Secondary | ICD-10-CM | POA: Diagnosis not present

## 2019-03-10 DIAGNOSIS — E7800 Pure hypercholesterolemia, unspecified: Secondary | ICD-10-CM | POA: Diagnosis not present

## 2019-03-11 ENCOUNTER — Other Ambulatory Visit: Payer: Self-pay | Admitting: Physician Assistant

## 2019-03-11 ENCOUNTER — Other Ambulatory Visit: Payer: Self-pay

## 2019-03-11 DIAGNOSIS — L659 Nonscarring hair loss, unspecified: Secondary | ICD-10-CM

## 2019-03-11 DIAGNOSIS — K219 Gastro-esophageal reflux disease without esophagitis: Secondary | ICD-10-CM

## 2019-03-18 DIAGNOSIS — E8881 Metabolic syndrome: Secondary | ICD-10-CM | POA: Diagnosis not present

## 2019-03-18 DIAGNOSIS — Z6841 Body Mass Index (BMI) 40.0 and over, adult: Secondary | ICD-10-CM | POA: Diagnosis not present

## 2019-03-25 DIAGNOSIS — R7303 Prediabetes: Secondary | ICD-10-CM | POA: Diagnosis not present

## 2019-03-25 DIAGNOSIS — Z6841 Body Mass Index (BMI) 40.0 and over, adult: Secondary | ICD-10-CM | POA: Diagnosis not present

## 2019-04-01 ENCOUNTER — Other Ambulatory Visit: Payer: Self-pay | Admitting: Physician Assistant

## 2019-04-01 DIAGNOSIS — K5900 Constipation, unspecified: Secondary | ICD-10-CM | POA: Diagnosis not present

## 2019-04-01 DIAGNOSIS — E7800 Pure hypercholesterolemia, unspecified: Secondary | ICD-10-CM | POA: Diagnosis not present

## 2019-04-01 DIAGNOSIS — Z6841 Body Mass Index (BMI) 40.0 and over, adult: Secondary | ICD-10-CM | POA: Diagnosis not present

## 2019-04-02 ENCOUNTER — Ambulatory Visit: Payer: BLUE CROSS/BLUE SHIELD

## 2019-04-03 DIAGNOSIS — L658 Other specified nonscarring hair loss: Secondary | ICD-10-CM | POA: Diagnosis not present

## 2019-04-08 DIAGNOSIS — R7303 Prediabetes: Secondary | ICD-10-CM | POA: Diagnosis not present

## 2019-04-08 DIAGNOSIS — Z6841 Body Mass Index (BMI) 40.0 and over, adult: Secondary | ICD-10-CM | POA: Diagnosis not present

## 2019-04-08 DIAGNOSIS — E8881 Metabolic syndrome: Secondary | ICD-10-CM | POA: Diagnosis not present

## 2019-04-08 DIAGNOSIS — E7800 Pure hypercholesterolemia, unspecified: Secondary | ICD-10-CM | POA: Diagnosis not present

## 2019-04-15 DIAGNOSIS — K5900 Constipation, unspecified: Secondary | ICD-10-CM | POA: Diagnosis not present

## 2019-04-15 DIAGNOSIS — Z6841 Body Mass Index (BMI) 40.0 and over, adult: Secondary | ICD-10-CM | POA: Diagnosis not present

## 2019-04-15 DIAGNOSIS — E8881 Metabolic syndrome: Secondary | ICD-10-CM | POA: Diagnosis not present

## 2019-05-15 ENCOUNTER — Ambulatory Visit: Payer: BC Managed Care – PPO

## 2019-06-10 ENCOUNTER — Encounter: Payer: Self-pay | Admitting: Gynecology

## 2019-06-27 ENCOUNTER — Other Ambulatory Visit: Payer: Self-pay | Admitting: Physician Assistant

## 2019-06-27 DIAGNOSIS — K219 Gastro-esophageal reflux disease without esophagitis: Secondary | ICD-10-CM

## 2019-08-29 ENCOUNTER — Ambulatory Visit
Admission: RE | Admit: 2019-08-29 | Discharge: 2019-08-29 | Disposition: A | Discharge status: Home / Self Care | Payer: BC Managed Care – PPO | Source: Ambulatory Visit | Attending: Physician Assistant | Admitting: Physician Assistant

## 2019-08-29 ENCOUNTER — Other Ambulatory Visit: Payer: Self-pay

## 2019-08-29 DIAGNOSIS — Z1231 Encounter for screening mammogram for malignant neoplasm of breast: Secondary | ICD-10-CM | POA: Diagnosis not present

## 2019-10-06 ENCOUNTER — Encounter: Payer: Self-pay | Admitting: Physician Assistant

## 2019-10-06 ENCOUNTER — Ambulatory Visit (INDEPENDENT_AMBULATORY_CARE_PROVIDER_SITE_OTHER): Payer: BC Managed Care – PPO | Admitting: Physician Assistant

## 2019-10-06 ENCOUNTER — Telehealth: Payer: Self-pay | Admitting: Physician Assistant

## 2019-10-06 ENCOUNTER — Other Ambulatory Visit: Payer: Self-pay

## 2019-10-06 DIAGNOSIS — Z720 Tobacco use: Secondary | ICD-10-CM

## 2019-10-06 DIAGNOSIS — Z0000 Encounter for general adult medical examination without abnormal findings: Secondary | ICD-10-CM | POA: Diagnosis not present

## 2019-10-06 DIAGNOSIS — Z800 Family history of malignant neoplasm of digestive organs: Secondary | ICD-10-CM

## 2019-10-06 DIAGNOSIS — K219 Gastro-esophageal reflux disease without esophagitis: Secondary | ICD-10-CM

## 2019-10-06 NOTE — Progress Notes (Signed)
I have discussed the procedure for the virtual visit with the patient who has given consent to proceed with assessment and treatment.   Patient unable to obtain vital signs.  Fritz Pickerel, CMA

## 2019-10-06 NOTE — Telephone Encounter (Signed)
Pt stated has cough and nose congestion  Pt schedule appointment at testing center

## 2019-10-06 NOTE — Progress Notes (Signed)
Virtual Visit via Video   I connected with patient on 10/06/19 at  9:30 AM EST by a video enabled telemedicine application and verified that I am speaking with the correct person using two identifiers.  Location patient: Home Location provider: [facility], Office Persons participating in the virtual visit: Patient, Provider, Mitchell Denita Lung)  I discussed the limitations of evaluation and management by telemedicine and the availability of in person appointments. The patient expressed understanding and agreed to proceed.  Subjective:   HPI:   Patient presents via doxy doxy.me today for annual examination.  ROS:  Review of Systems  Constitutional: Negative for fever and weight loss.  HENT: Negative for ear discharge, ear pain, hearing loss and tinnitus.   Eyes: Negative for blurred vision, double vision, photophobia and pain.  Respiratory: Negative for cough and shortness of breath.   Cardiovascular: Negative for chest pain and palpitations.  Gastrointestinal: Negative for abdominal pain, blood in stool, constipation, diarrhea, heartburn, melena, nausea and vomiting.  Genitourinary: Negative for dysuria, flank pain, frequency, hematuria and urgency.  Musculoskeletal: Negative for falls.  Neurological: Negative for dizziness, loss of consciousness and headaches.  Endo/Heme/Allergies: Negative for environmental allergies.  Psychiatric/Behavioral: Negative for depression, hallucinations, substance abuse and suicidal ideas. The patient is not nervous/anxious and does not have insomnia.    Patient Active Problem List   Diagnosis Date Noted  . Depression 09/03/2018  . Tobacco abuse 09/03/2018  . Skin lesion 09/03/2018  . Breast cancer screening 07/26/2016  . Visit for preventive health examination 07/28/2015  . Rhinitis, allergic 02/26/2015  . Morbid obesity ([place]) 10/17/2013  . Acid reflux 06/04/2013    Social History   Tobacco Use  . Smoking status: Current  Every Day Smoker    Packs/day: 0.50    Years: 27.00    Pack years: 13.50    Types: Cigarettes  . Smokeless tobacco: Never Used  Substance Use Topics  . Alcohol use: Yes    Alcohol/week: 0.0 standard drinks    Comment: Rare    Current Outpatient Medications:  .  cholecalciferol (VITAMIN D3) 25 MCG (1000 UNIT) tablet, Take 1,000 Units by mouth daily., Disp: , Rfl:  .  omeprazole (PRILOSEC) 20 MG capsule, TAKE 1 CAPSULE BY MOUTH EVERY DAY, Disp: 90 capsule, Rfl: 0 .  buPROPion (WELLBUTRIN XL) 300 MG 24 hr tablet, TAKE 1 TABLET BY MOUTH EVERY DAY (Patient not taking: Reported on 10/06/2019), Disp: 90 tablet, Rfl: 1 .  phentermine 15 MG capsule, TAKE 1 CAPSULE BY MOUTH EVERY DAY FOR 14 DAYS, Disp: , Rfl:   No Known Allergies  Objective:   There were no vitals taken for this visit.  Patient is well-developed, well-nourished in no acute distress.  Resting comfortably at home.  Head is normocephalic, atraumatic.  No labored breathing.  Speech is clear and coherent with logical content.  Patient is alert and oriented at baseline.   Assessment and Plan:   1. Visit for preventive health examination Depression screen negative. Health Maintenance reviewed --mammogram and Pap up-to-date.  Due for flu shot.  Will get when she comes in for labs.. Preventive schedule discussed and handout given in AVS. Patient to call back and schedule lab appointment.  2. Family history of colorectal cancer New information regarding a maternal that was diagnosed with colon cancer in her 73s.  Patient asymptomatic.  Due for screening based on risk factors.  Referral to GI placed for screening colonoscopy. - Ambulatory referral to Gastroenterology  3. Morbid obesity ([place]) Dietary  and exercise recommendations again reviewed with patient.  Handout sent MyChart.  She is going to try working hard on this.  We will update labs as noted.  4. Tobacco abuse Still smoking about 5 cigarettes/day.  Has quit before  on her home.  Declines any intervention today.  States she is ready and willing to quit but wants to do it on her own.  Encouragement given.  Reminded her to contact us at any time if she changes her mind about medication for this.  5. Gastroesophageal reflux disease, unspecified whether esophagitis present Stable.  Continue current regimen.  Trying to wean down on omeprazole to QOD dosing.    Leeanne Rio, PA-C 10/06/2019

## 2019-10-06 NOTE — Patient Instructions (Addendum)
Instructions sent to MyChart.   Please keep working on diet and exercise.  Your goal is to increase to 150 minutes of moderate intensity aerobic exercise per week.  Try to cut back on foods that are processed and high in saturated fats and sugar.  Choose lean proteins when possible --chicken, fish or plant-based proteins (beans, legumes, tofu)  Half of your plate should be made up of fruits and vegetables, 1/4 with whole grain and 1/4 with a lean protein.  You will be contacted by gastroenterology to schedule screening colonoscopy.  Please call us when you find out her mother's Covid results so we can schedule lab appointment for you.  We can update your flu shot at that time.   Preventive Care 47-47 Years Old, Female Preventive care refers to visits with your health care provider and lifestyle choices that can promote health and wellness. This includes:  A yearly physical exam. This may also be called an annual well check.  Regular dental visits and eye exams.  Immunizations.  Screening for certain conditions.  Healthy lifestyle choices, such as eating a healthy diet, getting regular exercise, not using drugs or products that contain nicotine and tobacco, and limiting alcohol use. What can I expect for my preventive care visit? Physical exam Your health care provider will check your:  Height and weight. This may be used to calculate body mass index (BMI), which tells if you are at a healthy weight.  Heart rate and blood pressure.  Skin for abnormal spots. Counseling Your health care provider may ask you questions about your:  Alcohol, tobacco, and drug use.  Emotional well-being.  Home and relationship well-being.  Sexual activity.  Eating habits.  Work and work Statistician.  Method of birth control.  Menstrual cycle.  Pregnancy history. What immunizations do I need?  Influenza (flu) vaccine  This is recommended every year. Tetanus, diphtheria, and  pertussis (Tdap) vaccine  You may need a Td booster every 10 years. Varicella (chickenpox) vaccine  You may need this if you have not been vaccinated. Zoster (shingles) vaccine  You may need this after age 47. Measles, mumps, and rubella (MMR) vaccine  You may need at least one dose of MMR if you were born in 1957 or later. You may also need a second dose. Pneumococcal conjugate (PCV13) vaccine  You may need this if you have certain conditions and were not previously vaccinated. Pneumococcal polysaccharide (PPSV23) vaccine  You may need one or two doses if you smoke cigarettes or if you have certain conditions. Meningococcal conjugate (MenACWY) vaccine  You may need this if you have certain conditions. Hepatitis A vaccine  You may need this if you have certain conditions or if you travel or work in places where you may be exposed to hepatitis A. Hepatitis B vaccine  You may need this if you have certain conditions or if you travel or work in places where you may be exposed to hepatitis B. Haemophilus influenzae type b (Hib) vaccine  You may need this if you have certain conditions. Human papillomavirus (HPV) vaccine  If recommended by your health care provider, you may need three doses over 6 months. You may receive vaccines as individual doses or as more than one vaccine together in one shot (combination vaccines). Talk with your health care provider about the risks and benefits of combination vaccines. What tests do I need? Blood tests  Lipid and cholesterol levels. These may be checked every 5 years, or more frequently if  you are over 63 years old.  Hepatitis C test.  Hepatitis B test. Screening  Lung cancer screening. You may have this screening every year starting at age 37 if you have a 30-pack-year history of smoking and currently smoke or have quit within the past 15 years.  Colorectal cancer screening. All adults should have this screening starting at age 74 and  continuing until age 72. Your health care provider may recommend screening at age 70 if you are at increased risk. You will have tests every 1-10 years, depending on your results and the type of screening test.  Diabetes screening. This is done by checking your blood sugar (glucose) after you have not eaten for a while (fasting). You may have this done every 1-3 years.  Mammogram. This may be done every 1-2 years. Talk with your health care provider about when you should start having regular mammograms. This may depend on whether you have a family history of breast cancer.  BRCA-related cancer screening. This may be done if you have a family history of breast, ovarian, tubal, or peritoneal cancers.  Pelvic exam and Pap test. This may be done every 3 years starting at age 14. Starting at age 65, this may be done every 5 years if you have a Pap test in combination with an HPV test. Other tests  Sexually transmitted disease (STD) testing.  Bone density scan. This is done to screen for osteoporosis. You may have this scan if you are at high risk for osteoporosis. Follow these instructions at home: Eating and drinking  Eat a diet that includes fresh fruits and vegetables, whole grains, lean protein, and low-fat dairy.  Take vitamin and mineral supplements as recommended by your health care provider.  Do not drink alcohol if: ? Your health care provider tells you not to drink. ? You are pregnant, may be pregnant, or are planning to become pregnant.  If you drink alcohol: ? Limit how much you have to 0-1 drink a day. ? Be aware of how much alcohol is in your drink. In the [place] one drink equals one 12 oz bottle of beer (355 mL), one 5 oz glass of wine (148 mL), or one 1 oz glass of hard liquor (44 mL). Lifestyle  Take daily care of your teeth and gums.  Stay active. Exercise for at least 30 minutes on 5 or more days each week.  Do not use any products that contain nicotine or tobacco,  such as cigarettes, e-cigarettes, and chewing tobacco. If you need help quitting, ask your health care provider.  If you are sexually active, practice safe sex. Use a condom or other form of birth control (contraception) in order to prevent pregnancy and STIs (sexually transmitted infections).  If told by your health care provider, take low-dose aspirin daily starting at age 53. What's next?  Visit your health care provider once a year for a well check visit.  Ask your health care provider how often you should have your eyes and teeth checked.  Stay up to date on all vaccines. This information is not intended to replace advice given to you by your health care provider. Make sure you discuss any questions you have with your health care provider. Document Revised: 05/16/2018 Document Reviewed: 05/16/2018 Elsevier Patient Education  2020 [employer].

## 2019-10-06 NOTE — Telephone Encounter (Signed)
Pt called in stating that her mother tested positive for Covid. She received her results today. Pt wanted to make [facility] aware of this and to see if she should go be tested as well.

## 2019-10-07 ENCOUNTER — Ambulatory Visit: Payer: BC Managed Care – PPO | Attending: Internal Medicine

## 2019-10-07 DIAGNOSIS — Z20822 Contact with and (suspected) exposure to covid-19: Secondary | ICD-10-CM

## 2019-10-08 LAB — NOVEL CORONAVIRUS, NAA: SARS-CoV-2, NAA: DETECTED — AB

## 2019-11-27 ENCOUNTER — Telehealth: Payer: Self-pay | Admitting: Physician Assistant

## 2019-11-27 ENCOUNTER — Other Ambulatory Visit: Payer: Self-pay | Admitting: Emergency Medicine

## 2019-11-27 DIAGNOSIS — Z720 Tobacco use: Secondary | ICD-10-CM

## 2019-11-27 DIAGNOSIS — Z0000 Encounter for general adult medical examination without abnormal findings: Secondary | ICD-10-CM

## 2019-11-27 NOTE — Telephone Encounter (Signed)
Patient had CPE on 10/06/19 but due to her mother testing positive for COVID and she as well. We waited on her labs. Please advise of which labs she is needing.

## 2019-11-27 NOTE — Telephone Encounter (Signed)
Spoke with patient of needing a lab appointment for CPE in Jan Advised patient of possibilities of being a smoker and not hydrating could cause some issues with giving blood. Patient denies any family hx of coagulation or clotting disorders Lab orders placed, lab appointment scheduled

## 2019-11-27 NOTE — Telephone Encounter (Signed)
Pt called in asking when she is due to have bw, I can't see if orders are placed in the system due to getting an error that says you don't have security to view or to launch this data method.   She also called in stating that she donated plasma, the first time donating went well but the second time her blood clotted she wanted to know why this happened. Is it something she should be worried about?  Please advise pt can be reached at the home #   She is aware that Einar Pheasant is not in the office this afternoon.

## 2019-11-27 NOTE — Telephone Encounter (Signed)
CBC w diff, CMP, Lipid, A1C -- Diagnoses: Visit for Preventive, Obesity, Tobacco Use  In regards to donating blood this can sometimes just happen. She is a smoker so that does increase the viscosity of her blood (making it thicker, etc). Any significant family history of blood clots? If so would add on an anticoagulation panel to her fasting labs.

## 2019-12-04 ENCOUNTER — Ambulatory Visit: Payer: BLUE CROSS/BLUE SHIELD

## 2019-12-18 ENCOUNTER — Ambulatory Visit: Payer: BC Managed Care – PPO

## 2019-12-23 ENCOUNTER — Other Ambulatory Visit: Payer: Self-pay

## 2019-12-24 ENCOUNTER — Ambulatory Visit (INDEPENDENT_AMBULATORY_CARE_PROVIDER_SITE_OTHER): Payer: BC Managed Care – PPO | Admitting: Women's Health

## 2019-12-24 ENCOUNTER — Encounter: Payer: Self-pay | Admitting: Women's Health

## 2019-12-24 VITALS — BP 122/78 | Ht 63.0 in | Wt 255.0 lb

## 2019-12-24 DIAGNOSIS — Z01419 Encounter for gynecological examination (general) (routine) without abnormal findings: Secondary | ICD-10-CM | POA: Diagnosis not present

## 2019-12-24 MED ORDER — IMIQUIMOD 5 % EX CREA: TOPICAL_CREAM | CUTANEOUS | 0 refills | Status: DC

## 2019-12-24 NOTE — Progress Notes (Signed)
Nicole Mueller Jan 10, 1971 [id number]    History:    Presents for annual exam.  Monthly cycle using no contraception for many years without pregnancy.  Does have a daughter who is 47 years old.  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  Normal mammogram history.  History of GERD.  Had Covid in January mild case.  Smokes less than half pack daily  Past medical history, past surgical history, family history and social history were all reviewed and documented in the EPIC chart.  Works for the [employer] of [employer].  ROS:  A ROS was performed and pertinent positives and negatives are included.  Exam:  Vitals:   12/24/19 1545  BP: 122/78  Weight: 255 lb (115.7 kg)  Height: 5\' 3"  (1.6 m)   Body mass index is 45.17 kg/m.   General appearance:  Normal Thyroid:  Symmetrical, normal in size, without palpable masses or nodularity. Respiratory  Auscultation:  Clear without wheezing or rhonchi Cardiovascular  Auscultation:  Regular rate, without rubs, murmurs or gallops  Edema/varicosities:  Not grossly evident Abdominal  Soft,nontender, without masses, guarding or rebound.  Liver/spleen:  No organomegaly noted  Hernia:  None appreciated  Skin  Inspection:  Grossly normal   Breasts: Examined lying and sitting.     Right: Without masses, retractions, discharge or axillary adenopathy.     Left: Without masses, retractions, discharge or axillary adenopathy. 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    BUS/Urethra/Skene's glands:  Normal  Vagina:  Normal  Cervix:  Normal  Uterus:  normal in size, shape and contour.  Midline and mobile  Adnexa/parametria:     Rt: Without masses or tenderness.   Lt: Without masses or tenderness.  Anus and perineum: Normal  Digital rectal exam: Normal sphincter tone without palpated masses or  tenderness  Assessment/Plan:  49 y.o. MBF G3, P1 for annual exam with no complaints of vaginal discharge, urinary symptoms, abdominal pain, dyspareunia or fever.  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  Instructed to call if continued problems.  SBEs, continue annual 3D screening mammogram, calcium rich foods, vitamin D 2000 IUs daily encouraged.  Aware of importance of increasing exercise and decreasing calories/carbs for weight loss.  Aware of hazards of smoking tips for quitting.  Contraception reviewed declines.  Pap reviewed if Pap normal will space to every 3 years.   [place], 4:14 PM 12/24/2019

## 2019-12-24 NOTE — Patient Instructions (Signed)
Vit D 2000 iu daily Pleasure knowing you!! Health Maintenance, Female Adopting a healthy lifestyle and getting preventive care are important in promoting health and wellness. Ask your health care provider about:  The right schedule for you to have regular tests and exams.  Things you can do on your own to prevent diseases and keep yourself healthy. What should I know about diet, weight, and exercise? Eat a healthy diet   Eat a diet that includes plenty of vegetables, fruits, low-fat dairy products, and lean protein.  Do not eat a lot of foods that are high in solid fats, added sugars, or sodium. Maintain a healthy weight Body mass index (BMI) is used to identify weight problems. It estimates body fat based on height and weight. Your health care provider can help determine your BMI and help you achieve or maintain a healthy weight. Get regular exercise Get regular exercise. This is one of the most important things you can do for your health. Most adults should:  Exercise for at least 150 minutes each week. The exercise should increase your heart rate and make you sweat (moderate-intensity exercise).  Do strengthening exercises at least twice a week. This is in addition to the moderate-intensity exercise.  Spend less time sitting. Even light physical activity can be beneficial. Watch cholesterol and blood lipids Have your blood tested for lipids and cholesterol at 47 years of age, then have this test every 5 years. Have your cholesterol levels checked more often if:  Your lipid or cholesterol levels are high.  You are older than 47 years of age.  You are at high risk for heart disease. What should I know about cancer screening? Depending on your health history and family history, you may need to have cancer screening at various ages. This may include screening for:  Breast cancer.  Cervical cancer.  Colorectal cancer.  Skin cancer.  Lung cancer. What should I know about  heart disease, diabetes, and high blood pressure? Blood pressure and heart disease  High blood pressure causes heart disease and increases the risk of stroke. This is more likely to develop in people who have high blood pressure readings, are of African descent, or are overweight.  Have your blood pressure checked: ? Every 3-5 years if you are 18-39 years of age. ? Every year if you are 40 years old or older. Diabetes Have regular diabetes screenings. This checks your fasting blood sugar level. Have the screening done:  Once every three years after age 40 if you are at a normal weight and have a low risk for diabetes.  More often and at a younger age if you are overweight or have a high risk for diabetes. What should I know about preventing infection? Hepatitis B If you have a higher risk for hepatitis B, you should be screened for this virus. Talk with your health care provider to find out if you are at risk for hepatitis B infection. Hepatitis C Testing is recommended for:  Everyone born from 1945 through 1965.  Anyone with known risk factors for hepatitis C. Sexually transmitted infections (STIs)  Get screened for STIs, including gonorrhea and chlamydia, if: ? You are sexually active and are younger than 47 years of age. ? You are older than 47 years of age and your health care provider tells you that you are at risk for this type of infection. ? Your sexual activity has changed since you were last screened, and you are at increased risk for chlamydia   or gonorrhea. Ask your health care provider if you are at risk.  Ask your health care provider about whether you are at high risk for HIV. Your health care provider may recommend a prescription medicine to help prevent HIV infection. If you choose to take medicine to prevent HIV, you should first get tested for HIV. You should then be tested every 3 months for as long as you are taking the medicine. Pregnancy  If you are about to  stop having your period (premenopausal) and you may become pregnant, seek counseling before you get pregnant.  Take 400 to 800 micrograms (mcg) of folic acid every day if you become pregnant.  Ask for birth control (contraception) if you want to prevent pregnancy. Osteoporosis and menopause Osteoporosis is a disease in which the bones lose minerals and strength with aging. This can result in bone fractures. If you are 65 years old or older, or if you are at risk for osteoporosis and fractures, ask your health care provider if you should:  Be screened for bone loss.  Take a calcium or vitamin D supplement to lower your risk of fractures.  Be given hormone replacement therapy (HRT) to treat symptoms of menopause. Follow these instructions at home: Lifestyle  Do not use any products that contain nicotine or tobacco, such as cigarettes, e-cigarettes, and chewing tobacco. If you need help quitting, ask your health care provider.  Do not use street drugs.  Do not share needles.  Ask your health care provider for help if you need support or information about quitting drugs. Alcohol use  Do not drink alcohol if: ? Your health care provider tells you not to drink. ? You are pregnant, may be pregnant, or are planning to become pregnant.  If you drink alcohol: ? Limit how much you use to 0-1 drink a day. ? Limit intake if you are breastfeeding.  Be aware of how much alcohol is in your drink. In the [place] one drink equals one 12 oz bottle of beer (355 mL), one 5 oz glass of wine (148 mL), or one 1 oz glass of hard liquor (44 mL). General instructions  Schedule regular health, dental, and eye exams.  Stay current with your vaccines.  Tell your health care provider if: ? You often feel depressed. ? You have ever been abused or do not feel safe at home. Summary  Adopting a healthy lifestyle and getting preventive care are important in promoting health and wellness.  Follow your  health care provider's instructions about healthy diet, exercising, and getting tested or screened for diseases.  Follow your health care provider's instructions on monitoring your cholesterol and blood pressure. This information is not intended to replace advice given to you by your health care provider. Make sure you discuss any questions you have with your health care provider. Document Revised: 08/28/2018 Document Reviewed: 08/28/2018 Elsevier Patient Education  2020 [employer]  

## 2019-12-25 NOTE — Addendum Note (Signed)
Addended by: Lorine Bears on: 12/25/2019 08:04 AM   Modules accepted: Orders

## 2019-12-29 ENCOUNTER — Ambulatory Visit: Payer: BC Managed Care – PPO

## 2019-12-29 LAB — PAP IG W/ RFLX HPV ASCU

## 2020-01-07 ENCOUNTER — Other Ambulatory Visit: Payer: Self-pay | Admitting: Physician Assistant

## 2020-01-07 DIAGNOSIS — K219 Gastro-esophageal reflux disease without esophagitis: Secondary | ICD-10-CM

## 2020-02-02 ENCOUNTER — Other Ambulatory Visit: Payer: Self-pay

## 2020-02-02 ENCOUNTER — Ambulatory Visit (INDEPENDENT_AMBULATORY_CARE_PROVIDER_SITE_OTHER): Payer: BC Managed Care – PPO

## 2020-02-02 DIAGNOSIS — Z720 Tobacco use: Secondary | ICD-10-CM | POA: Diagnosis not present

## 2020-02-02 DIAGNOSIS — Z0000 Encounter for general adult medical examination without abnormal findings: Secondary | ICD-10-CM

## 2020-02-02 LAB — LIPID PANEL
Cholesterol: 185 mg/dL (ref 0–200)
HDL: 41.6 mg/dL (ref >39.00)
LDL Cholesterol: 126 mg/dL — ABNORMAL HIGH (ref 0–99)
NonHDL: 143.61
Total CHOL/HDL Ratio: 4
Triglycerides: 89 mg/dL (ref 0.0–149.0)
VLDL: 17.8 mg/dL (ref 0.0–40.0)

## 2020-02-03 ENCOUNTER — Ambulatory Visit: Payer: BC Managed Care – PPO

## 2020-02-04 ENCOUNTER — Other Ambulatory Visit: Payer: Self-pay | Admitting: Nurse Practitioner

## 2020-02-04 ENCOUNTER — Telehealth: Payer: Self-pay | Admitting: *Deleted

## 2020-02-04 DIAGNOSIS — B3731 Acute candidiasis of vulva and vagina: Secondary | ICD-10-CM

## 2020-02-04 MED ORDER — FLUCONAZOLE 150 MG PO TABS: 150.0000 mg | ORAL_TABLET | Freq: Once | ORAL | 0 refills | Status: AC

## 2020-02-04 NOTE — Telephone Encounter (Signed)
Patient called c/o external vulvar itching, no discharge, no odor, no urinary problems. Had BV noted on pap smear at annual visit on 12/24/19 was told Rx for flagyl 500 mg BID  x 7 day could be sent if symptoms. However she is only having itching and no classic BV symptoms.  Please advise

## 2020-02-04 NOTE — Telephone Encounter (Signed)
I sent in a one time dose of Diflucan. If she continues to have symptoms after this I recommend that she come in for testing. Thank you

## 2020-02-04 NOTE — Telephone Encounter (Signed)
Patient informed. 

## 2020-04-13 ENCOUNTER — Encounter: Payer: Self-pay | Admitting: Physician Assistant

## 2020-04-13 ENCOUNTER — Other Ambulatory Visit: Payer: Self-pay

## 2020-04-13 ENCOUNTER — Ambulatory Visit: Payer: BC Managed Care – PPO | Admitting: Physician Assistant

## 2020-04-13 VITALS — BP 124/84 | HR 99 | Temp 98.5°F | Resp 16 | Ht 63.0 in | Wt 262.0 lb

## 2020-04-13 DIAGNOSIS — F419 Anxiety disorder, unspecified: Secondary | ICD-10-CM

## 2020-04-13 DIAGNOSIS — F329 Major depressive disorder, single episode, unspecified: Secondary | ICD-10-CM

## 2020-04-13 DIAGNOSIS — R609 Edema, unspecified: Secondary | ICD-10-CM | POA: Diagnosis not present

## 2020-04-13 DIAGNOSIS — K219 Gastro-esophageal reflux disease without esophagitis: Secondary | ICD-10-CM

## 2020-04-13 LAB — BASIC METABOLIC PANEL
BUN: 11 mg/dL (ref 6–23)
CO2: 24 mEq/L (ref 19–32)
Calcium: 8.8 mg/dL (ref 8.4–10.5)
Chloride: 107 mEq/L (ref 96–112)
Creatinine, Ser: 0.79 mg/dL (ref 0.40–1.20)
GFR: 94.35 mL/min (ref >60.00)
Glucose, Bld: 82 mg/dL (ref 70–99)
Potassium: 4 mEq/L (ref 3.5–5.1)
Sodium: 137 mEq/L (ref 135–145)

## 2020-04-13 MED ORDER — PANTOPRAZOLE SODIUM 40 MG PO TBEC
40.0000 mg | DELAYED_RELEASE_TABLET | Freq: Every day | ORAL | 3 refills | Status: DC
Start: 2020-04-13 — End: 2020-07-05

## 2020-04-13 NOTE — Patient Instructions (Addendum)
Please go to the lab today for blood work.  I will call you with your results. We will alter treatment regimen(s) if indicated by your results.   Please follow the low-sodium diet below. At work you should be moving around or at least standing for 5-10 minutes every hour.  Elevate legs while resting.   For reflux, I am restarting you on Protonix as this seems to be covered this year. Follow GERD diet below. You really need to work on smoking cessation. I am setting you back up with Gastroenterology for further evaluation and management.  For mood, please reconsider letting me start a medication -- I would recommend something like Wellbutrin, Fluoxetine or Trintellix as these are weight neutral.   Try to download the HeadSpace or Calm app on your phone to work on mindfulness techniques. You can also get a book on this from your local book store.    DASH Eating Plan DASH stands for "Dietary Approaches to Stop Hypertension." The DASH eating plan is a healthy eating plan that has been shown to reduce high blood pressure (hypertension). It may also reduce your risk for type 2 diabetes, heart disease, and stroke. The DASH eating plan may also help with weight loss. What are tips for following this plan?  General guidelines  Avoid eating more than 2,300 mg (milligrams) of salt (sodium) a day. If you have hypertension, you may need to reduce your sodium intake to 1,500 mg a day.  Limit alcohol intake to no more than 1 drink a day for nonpregnant women and 2 drinks a day for men. One drink equals 12 oz of beer, 5 oz of wine, or 1 oz of hard liquor.  Work with your health care provider to maintain a healthy body weight or to lose weight. Ask what an ideal weight is for you.  Get at least 30 minutes of exercise that causes your heart to beat faster (aerobic exercise) most days of the week. Activities may include walking, swimming, or biking.  Work with your health care provider or diet and  nutrition specialist (dietitian) to adjust your eating plan to your individual calorie needs. Reading food labels   Check food labels for the amount of sodium per serving. Choose foods with less than 5 percent of the Daily Value of sodium. Generally, foods with less than 300 mg of sodium per serving fit into this eating plan.  To find whole grains, look for the word "whole" as the first word in the ingredient list. Shopping  Buy products labeled as "low-sodium" or "no salt added."  Buy fresh foods. Avoid canned foods and premade or frozen meals. Cooking  Avoid adding salt when cooking. Use salt-free seasonings or herbs instead of table salt or sea salt. Check with your health care provider or pharmacist before using salt substitutes.  Do not fry foods. Cook foods using healthy methods such as baking, boiling, grilling, and broiling instead.  Cook with heart-healthy oils, such as olive, canola, soybean, or sunflower oil. Meal planning  Eat a balanced diet that includes: ? 5 or more servings of fruits and vegetables each day. At each meal, try to fill half of your plate with fruits and vegetables. ? Up to 6-8 servings of whole grains each day. ? Less than 6 oz of lean meat, poultry, or fish each day. A 3-oz serving of meat is about the same size as a deck of cards. One egg equals 1 oz. ? 2 servings of low-fat dairy each  day. ? A serving of nuts, seeds, or beans 5 times each week. ? Heart-healthy fats. Healthy fats called Omega-3 fatty acids are found in foods such as flaxseeds and coldwater fish, like sardines, salmon, and mackerel.  Limit how much you eat of the following: ? Canned or prepackaged foods. ? Food that is high in trans fat, such as fried foods. ? Food that is high in saturated fat, such as fatty meat. ? Sweets, desserts, sugary drinks, and other foods with added sugar. ? Full-fat dairy products.  Do not salt foods before eating.  Try to eat at least 2 vegetarian  meals each week.  Eat more home-cooked food and less restaurant, buffet, and fast food.  When eating at a restaurant, ask that your food be prepared with less salt or no salt, if possible. What foods are recommended? The items listed may not be a complete list. Talk with your dietitian about what dietary choices are best for you. Grains Whole-grain or whole-wheat bread. Whole-grain or whole-wheat pasta. Brown rice. [place]. Bulgur. Whole-grain and low-sodium cereals. Pita bread. Low-fat, low-sodium crackers. Whole-wheat flour tortillas. Vegetables Fresh or frozen vegetables (raw, steamed, roasted, or grilled). Low-sodium or reduced-sodium tomato and vegetable juice. Low-sodium or reduced-sodium tomato sauce and tomato paste. Low-sodium or reduced-sodium canned vegetables. Fruits All fresh, dried, or frozen fruit. Canned fruit in natural juice (without added sugar). Meat and other protein foods Skinless chicken or [place]. Ground chicken or [place]. Pork with fat trimmed off. Fish and seafood. Egg whites. Dried beans, peas, or lentils. Unsalted nuts, nut butters, and seeds. Unsalted canned beans. Lean cuts of beef with fat trimmed off. Low-sodium, lean deli meat. Dairy Low-fat (1%) or fat-free (skim) milk. Fat-free, low-fat, or reduced-fat cheeses. Nonfat, low-sodium ricotta or cottage cheese. Low-fat or nonfat yogurt. Low-fat, low-sodium cheese. Fats and oils Soft margarine without trans fats. Vegetable oil. Low-fat, reduced-fat, or light mayonnaise and salad dressings (reduced-sodium). Canola, safflower, olive, soybean, and sunflower oils. Avocado. Seasoning and other foods Herbs. Spices. Seasoning mixes without salt. Unsalted popcorn and pretzels. Fat-free sweets. What foods are not recommended? The items listed may not be a complete list. Talk with your dietitian about what dietary choices are best for you. Grains Baked goods made with fat, such as croissants, muffins, or some  breads. Dry pasta or rice meal packs. Vegetables Creamed or fried vegetables. Vegetables in a cheese sauce. Regular canned vegetables (not low-sodium or reduced-sodium). Regular canned tomato sauce and paste (not low-sodium or reduced-sodium). Regular tomato and vegetable juice (not low-sodium or reduced-sodium). Angie Fava. Olives. Fruits Canned fruit in a light or heavy syrup. Fried fruit. Fruit in cream or butter sauce. Meat and other protein foods Fatty cuts of meat. Ribs. Fried meat. Berniece Salines. Sausage. Bologna and other processed lunch meats. Salami. Fatback. Hotdogs. Bratwurst. Salted nuts and seeds. Canned beans with added salt. Canned or smoked fish. Whole eggs or egg yolks. Chicken or [place] with skin. Dairy Whole or 2% milk, cream, and half-and-half. Whole or full-fat cream cheese. Whole-fat or sweetened yogurt. Full-fat cheese. Nondairy creamers. Whipped toppings. Processed cheese and cheese spreads. Fats and oils Butter. Stick margarine. Lard. Shortening. Ghee. Bacon fat. Tropical oils, such as coconut, palm kernel, or palm oil. Seasoning and other foods Salted popcorn and pretzels. Onion salt, garlic salt, seasoned salt, table salt, and sea salt. Worcestershire sauce. Tartar sauce. Barbecue sauce. Teriyaki sauce. Soy sauce, including reduced-sodium. Steak sauce. Canned and packaged gravies. Fish sauce. Oyster sauce. Cocktail sauce. Horseradish that you find on the  shelf. Ketchup. Mustard. Meat flavorings and tenderizers. Bouillon cubes. Hot sauce and Tabasco sauce. Premade or packaged marinades. Premade or packaged taco seasonings. Relishes. Regular salad dressings. Where to find more information:  National Heart, Lung, and [place]: https://wilson-eaton.com/  American Heart Association: www.heart.org Summary  The DASH eating plan is a healthy eating plan that has been shown to reduce high blood pressure (hypertension). It may also reduce your risk for type 2 diabetes, heart disease, and  stroke.  With the DASH eating plan, you should limit salt (sodium) intake to 2,300 mg a day. If you have hypertension, you may need to reduce your sodium intake to 1,500 mg a day.  When on the DASH eating plan, aim to eat more fresh fruits and vegetables, whole grains, lean proteins, low-fat dairy, and heart-healthy fats.  Work with your health care provider or diet and nutrition specialist (dietitian) to adjust your eating plan to your individual calorie needs. This information is not intended to replace advice given to you by your health care provider. Make sure you discuss any questions you have with your health care provider. Document Revised: 08/17/2017 Document Reviewed: 08/28/2016 Elsevier Patient Education  2020 [place] for Gastroesophageal Reflux Disease, Adult When you have gastroesophageal reflux disease (GERD), the foods you eat and your eating habits are very important. Choosing the right foods can help ease your discomfort. Think about working with a nutrition specialist (dietitian) to help you make good choices. What are tips for following this plan?  Meals  Choose healthy foods that are low in fat, such as fruits, vegetables, whole grains, low-fat dairy products, and lean meat, fish, and poultry.  Eat small meals often instead of 3 large meals a day. Eat your meals slowly, and in a place where you are relaxed. Avoid bending over or lying down until 2-3 hours after eating.  Avoid eating meals 2-3 hours before bed.  Avoid drinking a lot of liquid with meals.  Cook foods using methods other than frying. Bake, grill, or broil food instead.  Avoid or limit: ? Chocolate. ? Peppermint or spearmint. ? Alcohol. ? Pepper. ? Black and decaffeinated coffee. ? Black and decaffeinated tea. ? Bubbly (carbonated) soft drinks. ? Caffeinated energy drinks and soft drinks.  Limit high-fat foods such as: ? Fatty meat or fried foods. ? Whole milk, cream,  butter, or ice cream. ? Nuts and nut butters. ? Pastries, donuts, and sweets made with butter or shortening.  Avoid foods that cause symptoms. These foods may be different for everyone. Common foods that cause symptoms include: ? Tomatoes. ? Oranges, lemons, and limes. ? Peppers. ? Spicy food. ? Onions and garlic. ? Vinegar. Lifestyle  Maintain a healthy weight. Ask your doctor what weight is healthy for you. If you need to lose weight, work with your doctor to do so safely.  Exercise for at least 30 minutes for 5 or more days each week, or as told by your doctor.  Wear loose-fitting clothes.  Do not smoke. If you need help quitting, ask your doctor.  Sleep with the head of your bed higher than your feet. Use a wedge under the mattress or blocks under the bed frame to raise the head of the bed. Summary  When you have gastroesophageal reflux disease (GERD), food and lifestyle choices are very important in easing your symptoms.  Eat small meals often instead of 3 large meals a day. Eat your meals slowly, and in a place where you  are relaxed.  Limit high-fat foods such as fatty meat or fried foods.  Avoid bending over or lying down until 2-3 hours after eating.  Avoid peppermint and spearmint, caffeine, alcohol, and chocolate. This information is not intended to replace advice given to you by your health care provider. Make sure you discuss any questions you have with your health care provider. Document Revised: 12/26/2018 Document Reviewed: 10/10/2016 Elsevier Patient Education  [place].

## 2020-04-13 NOTE — Progress Notes (Signed)
Patient presents to clinic today to discuss multiple concerns.  Patient endorses intermittent episodes of foot and ankle swelling over the past few months.  Notes this is much worse at the end of the day and improves overnight when her feet are elevated.  Notes she does not hydrate well and has not been following a low-salt diet.  Has started a new job in the past few months which is mostly sedentary.  Denies exercise outside of work.  Denies any change to urinary or bowel habits.  Denies any fatigue or shortness of breath.  Patient also with history of gastroesophageal reflux disease, currently on omeprazole 20 mg daily.  Notes she just ran out of the medicine a few days ago but felt like it was not working well for her symptoms.  Was previously on Protonix with good relief of symptoms but insurance coverage changed.  Patient denies any epigastric pain, nausea or vomiting.  Denies any hematochezia, melena or tenesmus.  Does note occasional globus sensation but denies dysphagia or odynophagia. Continues to smoke.   Patient also notes a decrease in mood overall which she feels is secondary to stress levels.  Has a lot of responsibilities both at work and at home, acting as a main caregiver for her uncle.  Has also been helping a lot with her mother's help as well as giving a cousin rides to and from work.  Notes she has very little time for herself.  Has noted some anhedonia.  Denies change to sleep or appetite.  Denies suicidal thought or ideation.  Past Medical History:  Diagnosis Date  . Chicken pox   . 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 Elevated blood pressure   . Fibroids   . GERD (gastroesophageal reflux disease)   . 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 Hypertension   . Hypolipidemia   . Ovarian cyst   . STD (sexually transmitted disease)    gc over 35yrs ago  . UTI (lower urinary tract  infection)     Current Outpatient Medications on File Prior to Visit  Medication Sig Dispense Refill  . cholecalciferol (VITAMIN D3) 25 MCG (1000 UNIT) tablet Take 1,000 Units by mouth daily.    Marland Kitchen omeprazole (PRILOSEC) 20 MG capsule TAKE 1 CAPSULE BY MOUTH EVERY DAY (Patient not taking: Reported on 04/13/2020) 90 capsule 0   No current facility-administered medications on file prior to visit.    No Known Allergies  Family History  Problem Relation Age of Onset  . Alcoholism Father   . Drug abuse Father   . Prostate cancer Father   . Hypertension Mother   . Diabetes Mother   . Hyperlipidemia Mother   . Heart murmur Mother   . Alcoholism Maternal Grandfather   . Dementia Maternal Grandfather   . Arthritis Maternal Grandmother   . Breast cancer Maternal Grandmother 60  . Stroke Maternal Grandmother   . Hypertension Maternal Grandmother   . Stroke Maternal Aunt   . Colon cancer Maternal Aunt 70  . Hyperlipidemia Maternal Aunt   . Alcoholism Maternal Uncle        x2  . Alcoholism Maternal Aunt   . Aneurysm Maternal Aunt   . Hypertension Maternal Aunt        x2  . Stroke Brother   . Drug abuse Brother   . Heart disease Brother   . Bone cancer  Paternal Aunt     Social History   Socioeconomic History  . Marital status: Married    Spouse name: Not on file  . Number of children: Not on file  . Years of education: Not on file  . Highest education level: Not on file  Occupational History  . Not on file  Tobacco Use  . Smoking status: Current Every Day Smoker    Packs/day: 0.50    Years: 27.00    Pack years: 13.50    Types: Cigarettes  . Smokeless tobacco: Never Used  Vaping Use  . Vaping Use: Never used  Substance and Sexual Activity  . Alcohol use: Yes    Alcohol/week: 0.0 standard drinks    Comment: Rare  . Drug use: No  . Sexual activity: Yes    Partners: Male    Birth control/protection: None    Comment: 1st intercourse 49 yo- More than 5 partners  Other  Topics Concern  . Not on file  Social History Narrative  . Not on file   Social Determinants of Health   Financial Resource Strain:   . Difficulty of Paying Living Expenses:   Food Insecurity:   . Worried About Charity fundraiser in the Last Year:   . Arboriculturist in the Last Year:   Transportation Needs:   . Film/video editor (Medical):   Marland Kitchen Lack of Transportation (Non-Medical):   Physical Activity:   . Days of Exercise per Week:   . Minutes of Exercise per Session:   Stress:   . Feeling of Stress :   Social Connections:   . Frequency of Communication with Friends and Family:   . Frequency of Social Gatherings with Friends and Family:   . Attends Religious Services:   . Active Member of Clubs or Organizations:   . Attends Archivist Meetings:   Marland Kitchen Marital Status:    Review of Systems - See HPI.  All other ROS are negative.  BP 124/84   Pulse 99   Temp 98.5 F (36.9 C) (Temporal)   Resp 16   Ht 5\' 3"  (1.6 m)   Wt (!) 262 lb (118.8 kg)   SpO2 98%   BMI 46.41 kg/m   Physical Exam Vitals reviewed.  Constitutional:      Appearance: Normal appearance.  HENT:     Head: Normocephalic and atraumatic.  Eyes:     Conjunctiva/sclera: Conjunctivae normal.     Pupils: Pupils are equal, round, and reactive to light.  Cardiovascular:     Rate and Rhythm: Normal rate and regular rhythm.     Pulses: Normal pulses.          Dorsalis pedis pulses are 2+ on the right side and 2+ on the left side.       Posterior tibial pulses are 2+ on the right side and 2+ on the left side.     Heart sounds: Normal heart sounds.  Pulmonary:     Effort: Pulmonary effort is normal.     Breath sounds: Normal breath sounds.  Abdominal:     General: Bowel sounds are normal. There is no distension.     Palpations: Abdomen is soft.     Tenderness: There is no abdominal tenderness.  Musculoskeletal:     Cervical back: Neck supple.     Right lower leg: Edema (trace) present.      Left lower leg: Edema (trace) present.  Neurological:     General: No focal  deficit present.     Mental Status: She is alert and oriented to person, place, and time.  Psychiatric:        Mood and Affect: Mood normal.     Recent Results (from the past 2160 hour(s))  Lipid panel     Status: Abnormal   Collection Time: 02/02/20  9:46 AM  Result Value Ref Range   Cholesterol 185 0 - 200 mg/dL    Comment: ATP III Classification       Desirable:  < 200 mg/dL               Borderline High:  200 - 239 mg/dL          High:  > = 240 mg/dL   Triglycerides 89.0 0 - 149 mg/dL    Comment: Normal:  <150 mg/dLBorderline High:  150 - 199 mg/dL   HDL 41.60 >39.00 mg/dL   VLDL 17.8 0.0 - 40.0 mg/dL   LDL Cholesterol 126 (H) 0 - 99 mg/dL   Total CHOL/HDL Ratio 4     Comment:                Men          Women1/2 Average Risk     3.4          3.3Average Risk          5.0          4.42X Average Risk          9.6          7.13X Average Risk          15.0          11.0                       NonHDL 143.61     Comment: NOTE:  Non-HDL goal should be 30 mg/dL higher than patient's LDL goal (i.e. LDL goal of < 70 mg/dL, would have non-HDL goal of < 100 mg/dL)    Assessment/Plan: 1. Peripheral edema Mild. Likely related to salt intake and sedentary lifestyle. Will check BMP today. Start low-sodium diet. Supportive measures reviewed. Will monitor.  - Basic metabolic panel  2. Gastroesophageal reflux disease, unspecified whether esophagitis present Omeprazole subtherapeutic. Since insurance changed and Protonix covered will restart Protonix 40 mg.  Patient again encouraged to stop smoking.  Encouraged her to let us start medicine to help with this.  She will give some thought.  Patient to continue GERD diet.  Will refer her back to gastroenterology due to ongoing issue as she would likely benefit from EGD evaluation.  3. Anxiety and depression Situational but unclear how long patient will remain in current  situation.  Discussed mindfulness meditation, counseling and pharmacotherapy.  Patient declines medication.  States she will give some thought to counseling.  Is agreeable to starting mindfulness at home.  Recommendations given for her.  Will monitor closely.  She is to let us know at any time if she would like to reconsider medication and/or counseling.  This visit occurred during the SARS-CoV-2 public health emergency.  Safety protocols were in place, including screening questions prior to the visit, additional usage of staff PPE, and extensive cleaning of exam room while observing appropriate contact time as indicated for disinfecting solutions.     Leeanne Rio, PA-C

## 2020-04-15 ENCOUNTER — Encounter: Payer: Self-pay | Admitting: Gastroenterology

## 2020-06-11 ENCOUNTER — Other Ambulatory Visit: Payer: BC Managed Care – PPO

## 2020-06-11 DIAGNOSIS — Z20822 Contact with and (suspected) exposure to covid-19: Secondary | ICD-10-CM

## 2020-06-13 LAB — SARS-COV-2, NAA 2 DAY TAT

## 2020-06-13 LAB — NOVEL CORONAVIRUS, NAA: SARS-CoV-2, NAA: NOT DETECTED

## 2020-06-16 ENCOUNTER — Encounter: Payer: Self-pay | Admitting: Gastroenterology

## 2020-06-16 ENCOUNTER — Ambulatory Visit (INDEPENDENT_AMBULATORY_CARE_PROVIDER_SITE_OTHER): Payer: BC Managed Care – PPO | Admitting: Gastroenterology

## 2020-06-16 DIAGNOSIS — Z1211 Encounter for screening for malignant neoplasm of colon: Secondary | ICD-10-CM | POA: Diagnosis not present

## 2020-06-16 DIAGNOSIS — K219 Gastro-esophageal reflux disease without esophagitis: Secondary | ICD-10-CM | POA: Diagnosis not present

## 2020-06-16 DIAGNOSIS — R070 Pain in throat: Secondary | ICD-10-CM

## 2020-06-16 DIAGNOSIS — R140 Abdominal distension (gaseous): Secondary | ICD-10-CM | POA: Diagnosis not present

## 2020-06-16 MED ORDER — PLENVU 140 G PO SOLR
ORAL | 0 refills | Status: DC
Start: 2020-06-16 — End: 2020-08-04

## 2020-06-16 NOTE — Patient Instructions (Signed)
If you are age 47 or older, your body mass index should be between 23-30. Your Body mass index is 46.79 kg/m. If this is out of the aforementioned range listed, please consider follow up with your Primary Care Provider.  If you are age 88 or younger, your body mass index should be between 19-25. Your Body mass index is 46.79 kg/m. If this is out of the aformentioned range listed, please consider follow up with your Primary Care Provider.   You have been scheduled for a colonoscopy. Please follow written instructions given to you at your visit today.  Please pick up your prep supplies at the pharmacy within the next 1-3 days. If you use inhalers (even only as needed), please bring them with you on the day of your procedure.  It was a pleasure to see you today!  Dr. Loletha Carrow

## 2020-06-16 NOTE — Progress Notes (Addendum)
[facility] Consult Note:  History: Nicole Mueller 06/16/2020  Referring provider: Brunetta Jeans, PA-C  Reason for consult/chief complaint: Gastroesophageal Reflux (patient feels like she has something stuck in throat on right side  / feels like it phelgm)   Subjective  HPI: Seen in the office April 2018 for chronic symptoms of reflux with regurgitation and pyrosis as well as a right-sided throat/neck discomfort with globus sensation.  She was a former smoker, and was due to see ENT soon after that visit.  Her reflux symptoms had improved somewhat with PPI, but the throat symptoms did not.  She also had probable allergic rhinitis with postnasal drip contributing.  EGD May 2018 with normal appearing larynx, normal exam other than somewhat patulous LES. Primary care clinic note from April 2020 indicated patient was on omeprazole 20 mg daily, had some dietary indiscretions triggering reflux symptoms and had gained weight as well as resume smoking. _______________________   [name] wanted to see me for reevaluation of throat discomfort.  It apparently improved to some degree when I saw her a few years ago, and she believes that she probably did see ENT but could not recall the physician's name.  She does not think a fiberoptic exam was done when I described that. Over the last several months she has again been bothered by a throat discomfort that is mostly midline over little more toward the right.  She feels it if she presses on it it might move around and wonders if it could be phlegm.  She still has intermittent heartburn and regurgitation mostly controlled with once daily PPI but sometimes triggered by eating certain foods or late in the evening.  She is still smoking and is also troubled by weight gain.  She has occasional constipation, denies rectal bleeding and has had no prior colon cancer screening.  ROS:  Review of Systems  Constitutional: Negative for  appetite change and unexpected weight change.  HENT: Negative for mouth sores and voice change.   Eyes: Negative for pain and redness.  Respiratory: Negative for cough and shortness of breath.   Cardiovascular: Negative for chest pain and palpitations.  Genitourinary: Negative for dysuria and hematuria.  Musculoskeletal: Positive for arthralgias. Negative for myalgias.  Skin: Negative for pallor and rash.  Allergic/Immunologic: Positive for environmental allergies.  Neurological: Negative for weakness and headaches.  Hematological: Negative for adenopathy.   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   normal cytology positive high-risk HPV, negative subtype 16, 18/45 recommend repeat Pap smear in one year   Hypertension    Hypolipidemia    Ovarian cyst    STD (sexually transmitted disease)    gc over 28yrs ago   UTI (lower urinary tract infection)      Past Surgical History: Past Surgical History:  Procedure Laterality Date   FOOT SURGERY Right    bone spur   WISDOM TOOTH EXTRACTION       Family History: Family History  Problem Relation Age of Onset   Alcoholism Father    Drug abuse Father    Prostate cancer Father    Hypertension Mother    Diabetes Mother    Hyperlipidemia Mother    Heart murmur Mother  Alcoholism Maternal Grandfather    Dementia Maternal Grandfather    Arthritis Maternal Grandmother    Breast cancer Maternal Grandmother 26   Stroke Maternal Grandmother    Hypertension Maternal Grandmother    Stroke Maternal Aunt    Colon cancer Maternal Aunt 50   Hyperlipidemia Maternal Aunt    Alcoholism Maternal Uncle        x2   Alcoholism Maternal Aunt    Aneurysm Maternal Aunt     Hypertension Maternal Aunt        x2   Stroke Brother    Drug abuse Brother    Heart disease Brother    Bone cancer Paternal Aunt     Social History: Social History   Socioeconomic History   Marital status: Married    Spouse name: Not on file   Number of children: Not on file   Years of education: Not on file   Highest education level: Not on file  Occupational History   Not on file  Tobacco Use   Smoking status: Current Every Day Smoker    Packs/day: 0.50    Years: 27.00    Pack years: 13.50    Types: Cigarettes   Smokeless tobacco: Never Used   Tobacco comment: stopped smoking on sunday 06/13/20  Vaping Use   Vaping Use: Never used  Substance and Sexual Activity   Alcohol use: Yes    Alcohol/week: 0.0 standard drinks    Comment: Rare   Drug use: No   Sexual activity: Yes    Partners: Male    Birth control/protection: None    Comment: 1st intercourse 49 yo- More than 5 partners  Other Topics Concern   Not on file  Social History Narrative   Not on file   Social Determinants of Health   Financial Resource Strain:    Difficulty of Paying Living Expenses: Not on file  Food Insecurity:    Worried About Charity fundraiser in the Last Year: Not on file   [employer] of Food in the Last Year: Not on file  Transportation Needs:    Lack of Transportation (Medical): Not on file   Lack of Transportation (Non-Medical): Not on file  Physical Activity:    Days of Exercise per Week: Not on file   Minutes of Exercise per Session: Not on file  Stress:    Feeling of Stress : Not on file  Social Connections:    Frequency of Communication with Friends and Family: Not on file   Frequency of Social Gatherings with Friends and Family: Not on file   Attends Religious Services: Not on file   Active Member of Clubs or Organizations: Not on file   Attends Archivist Meetings: Not on file   Marital Status: Not on file    Social  worker  Allergies: No Known Allergies  Outpatient Meds: Current Outpatient Medications  Medication Sig Dispense Refill   cholecalciferol (VITAMIN D3) 25 MCG (1000 UNIT) tablet Take 1,000 Units by mouth daily.     pantoprazole (PROTONIX) 40 MG tablet Take 1 tablet (40 mg total) by mouth daily. 30 tablet 3   No current facility-administered medications for this visit.      ___________________________________________________________________ Objective   Exam:  BP 130/82    Pulse 86    Ht 5\' 3"  (1.6 m)    Wt 264 lb 2 oz (119.8 kg)    BMI 46.79 kg/m    General: Well-appearing, pleasant and conversational, normal vocal quality  Eyes: sclera anicteric, no redness  ENT: oral mucosa moist without lesions, no cervical or supraclavicular lymphadenopathy.  No neck tenderness or palpable abnormality  CV: RRR without murmur, S1/S2, no JVD, no peripheral edema  Resp: clear to auscultation bilaterally, normal RR and effort noted  GI: soft, no tenderness, with active bowel sounds. No guarding or palpable organomegaly noted.  Skin; warm and dry, no rash or jaundice noted  Neuro: awake, alert and oriented x 3. Normal gross motor function and fluent speech   Assessment: Encounter Diagnoses  Name Primary?   Throat pain Yes   Gastroesophageal reflux disease without esophagitis    Special screening for malignant neoplasms, colon    Abdominal bloating    Morbid obesity (HCC)     Recurrent throat discomfort that is probably a combination of reflux, sinus congestion with allergic postnasal drip and smoking. She has had abdominal bloating and no prior colon cancer screening.  Plan:  Screening colonoscopy.  She was agreeable after discussion of procedure and risks.  The benefits and risks of the planned procedure were described in detail with the patient or (when appropriate) their health care proxy.  Risks were outlined as including, but not limited to, bleeding, infection,  perforation, adverse medication reaction leading to cardiac or pulmonary decompensation, pancreatitis (if ERCP).  The limitation of incomplete mucosal visualization was also discussed.  No guarantees or warranties were given.  Patient at increased risk for cardiopulmonary complications of procedure due to medical comorbidities.   I recommend she try to identify the ENT physician she saw a few years ago.  Perhaps her primary care provider has a note in the able to help her in that regard.  I feel that she should see them again for fiberoptic exam since she has a throat discomfort and has been a smoker, thus neoplasia should be ruled out. I doubt that a repeat upper endoscopy at this point would be revealing.  She needs help from primary care regarding allergies and postnasal drip.  GERD may be contributing, the extent to which is unknown.  If it is, then significant diet and lifestyle changes are necessary to help control that given the limitations of acid suppression therapy. She has been doing the best she can to work with primary care and quit smoking.  I offered a referral to the [facility], which she was very interested in doing.  I hope that could confer significant health benefits for her, not just for control of her reflux but also cardiovascular and orthopedic health.  Thank you for the courtesy of this consult.  Please call me with any questions or concerns.  Nelida Meuse III  CC: Referring provider noted above

## 2020-07-05 ENCOUNTER — Other Ambulatory Visit: Payer: Self-pay | Admitting: Physician Assistant

## 2020-07-29 DIAGNOSIS — Z20822 Contact with and (suspected) exposure to covid-19: Secondary | ICD-10-CM | POA: Diagnosis not present

## 2020-08-02 ENCOUNTER — Encounter (INDEPENDENT_AMBULATORY_CARE_PROVIDER_SITE_OTHER): Payer: Self-pay

## 2020-08-04 ENCOUNTER — Other Ambulatory Visit: Payer: Self-pay

## 2020-08-04 ENCOUNTER — Telehealth: Payer: Self-pay | Admitting: Gastroenterology

## 2020-08-04 MED ORDER — PLENVU 140 G PO SOLR: ORAL | 0 refills | Status: DC

## 2020-08-04 NOTE — Telephone Encounter (Signed)
Patient called states [facility] did not receive her Plenvu prep medication please resend

## 2020-08-04 NOTE — Telephone Encounter (Signed)
Plenvu was sent originally on 06-16-2020. Reordered today as requested

## 2020-08-09 ENCOUNTER — Other Ambulatory Visit: Payer: Self-pay

## 2020-08-09 ENCOUNTER — Other Ambulatory Visit: Payer: Self-pay | Admitting: Gastroenterology

## 2020-08-09 ENCOUNTER — Encounter: Payer: Self-pay | Admitting: Gastroenterology

## 2020-08-09 ENCOUNTER — Ambulatory Visit (AMBULATORY_SURGERY_CENTER): Payer: BC Managed Care – PPO | Admitting: Gastroenterology

## 2020-08-09 VITALS — BP 138/93 | HR 84 | Temp 98.0°F | Resp 16 | Ht 63.0 in | Wt 264.0 lb

## 2020-08-09 DIAGNOSIS — Z1211 Encounter for screening for malignant neoplasm of colon: Secondary | ICD-10-CM

## 2020-08-09 DIAGNOSIS — D123 Benign neoplasm of transverse colon: Secondary | ICD-10-CM | POA: Diagnosis not present

## 2020-08-09 DIAGNOSIS — K621 Rectal polyp: Secondary | ICD-10-CM | POA: Diagnosis not present

## 2020-08-09 DIAGNOSIS — D128 Benign neoplasm of rectum: Secondary | ICD-10-CM

## 2020-08-09 LAB — HM COLONOSCOPY

## 2020-08-09 MED ORDER — SODIUM CHLORIDE 0.9 % IV SOLN: 500.0000 mL | Freq: Once | INTRAVENOUS | Status: DC

## 2020-08-09 NOTE — Progress Notes (Signed)
VS by CW. ?

## 2020-08-09 NOTE — Patient Instructions (Signed)
HANDOUTS PROVIDED ON: POLYPS & DIVERTICULOSIS  The polyps removed today have been sent for pathology.  The results can take 1-3 weeks to receive.  When your next colonoscopy should occur will be based on the pathology results.    You may resume your previous diet and medication schedule.  Thank you for allowing us to care for you today!!!   YOU HAD AN ENDOSCOPIC PROCEDURE TODAY AT THE  ENDOSCOPY CENTER:   Refer to the procedure report that was given to you for any specific questions about what was found during the examination.  If the procedure report does not answer your questions, please call your gastroenterologist to clarify.  If you requested that your care partner not be given the details of your procedure findings, then the procedure report has been included in a sealed envelope for you to review at your convenience later.  YOU SHOULD EXPECT: Some feelings of bloating in the abdomen. Passage of more gas than usual.  Walking can help get rid of the air that was put into your GI tract during the procedure and reduce the bloating. If you had a lower endoscopy (such as a colonoscopy or flexible sigmoidoscopy) you may notice spotting of blood in your stool or on the toilet paper. If you underwent a bowel prep for your procedure, you may not have a normal bowel movement for a few days.  Please Note:  You might notice some irritation and congestion in your nose or some drainage.  This is from the oxygen used during your procedure.  There is no need for concern and it should clear up in a day or so.  SYMPTOMS TO REPORT IMMEDIATELY:  Following lower endoscopy (colonoscopy or flexible sigmoidoscopy):  Excessive amounts of blood in the stool  Significant tenderness or worsening of abdominal pains  Swelling of the abdomen that is new, acute  Fever of 100F or higher  For urgent or emergent issues, a gastroenterologist can be reached at any hour by calling [phone]. Do not use MyChart  messaging for urgent concerns.    DIET:  We do recommend a small meal at first, but then you may proceed to your regular diet.  Drink plenty of fluids but you should avoid alcoholic beverages for 24 hours.  ACTIVITY:  You should plan to take it easy for the rest of today and you should NOT DRIVE or use heavy machinery until tomorrow (because of the sedation medicines used during the test).    FOLLOW UP: Our staff will call the number listed on your records Wednesday morning between 7:15 am and 8:15 am to check on you and address any questions or concerns that you may have regarding the information given to you following your procedure. If we do not reach you, we will leave a message.  We will attempt to reach you two times.  During this call, we will ask if you have developed any symptoms of COVID 19. If you develop any symptoms (ie: fever, flu-like symptoms, shortness of breath, cough etc.) before then, please call [phone].  If you test positive for Covid 19 in the 2 weeks post procedure, please call and report this information to us.    If any biopsies were taken you will be contacted by phone or by letter within the next 1-3 weeks.  Please call us at [phone] if you have not heard about the biopsies in 3 weeks.    SIGNATURES/CONFIDENTIALITY: You and/or your care partner have signed paperwork which   which will be entered into your electronic medical record.  These signatures attest to the fact that that the information above on your After Visit Summary has been reviewed and is understood.  Full responsibility of the confidentiality of this discharge information lies with you and/or your care-partner.

## 2020-08-09 NOTE — Op Note (Signed)
[facility] Patient Name: Nicole Mueller Procedure Date: 08/09/2020 3:53 PM MRN: [id number] Endoscopist: [place]. Loletha Carrow , MD Age: 49 Referring MD:  Date of Birth: April 05, 1971 Gender: Female Account #: [id number] Procedure:                Colonoscopy Indications:              Screening for colorectal malignant neoplasm, This                            is the patient's first colonoscopy Medicines:                Monitored Anesthesia Care Procedure:                Pre-Anesthesia Assessment:                           - Prior to the procedure, a History and Physical                            was performed, and patient medications and                            allergies were reviewed. The patient's tolerance of                            previous anesthesia was also reviewed. The risks                            and benefits of the procedure and the sedation                            options and risks were discussed with the patient.                            All questions were answered, and informed consent                            was obtained. Prior Anticoagulants: The patient has                            taken no previous anticoagulant or antiplatelet                            agents. ASA Grade Assessment: III - A patient with                            severe systemic disease. After reviewing the risks                            and benefits, the patient was deemed in                            satisfactory condition to undergo the procedure.  After obtaining informed consent, the colonoscope                            was passed under direct vision. Throughout the                            procedure, the patient's blood pressure, pulse, and                            oxygen saturations were monitored continuously. The                            Colonoscope was introduced through the anus and                            advanced to the the  cecum, identified by                            appendiceal orifice and ileocecal valve. The                            colonoscopy was performed without difficulty. The                            patient tolerated the procedure well. The quality                            of the bowel preparation was excellent. The                            ileocecal valve, appendiceal orifice, and rectum                            were photographed. Scope In: 4:03:01 PM Scope Out: 4:16:50 PM Scope Withdrawal Time: 0 hours 11 minutes 49 seconds  Total Procedure Duration: 0 hours 13 minutes 49 seconds  Findings:                 The perianal and digital rectal examinations were                            normal.                           A 10-12 mm polyp was found in the proximal                            transverse colon. The polyp was pedunculated. The                            polyp was removed with a hot snare. Resection and                            retrieval were complete. (en bloc removal, then  polyp cut with cold snare for retrieval)                           A diminutive polyp was found in the proximal                            rectum. The polyp was sessile. The polyp was                            removed with a cold snare. Resection and retrieval                            were complete.                           A few diverticula were found in the left colon.                           The exam was otherwise without abnormality on                            direct and retroflexion views. Complications:            No immediate complications. Estimated Blood Loss:     Estimated blood loss was minimal. Impression:               - One 10-12 mm polyp in the proximal transverse                            colon, removed with a hot snare. Resected and                            retrieved.                           - One diminutive polyp in the proximal rectum,                             removed with a cold snare. Resected and retrieved.                           - Diverticulosis in the left colon.                           - The examination was otherwise normal on direct                            and retroflexion views. Recommendation:           - Patient has a contact number available for                            emergencies. The signs and symptoms of potential                            delayed  complications were discussed with the                            patient. Return to normal activities tomorrow.                            Written discharge instructions were provided to the                            patient.                           - Resume previous diet.                           - Continue present medications.                           - Await pathology results.                           - Repeat colonoscopy is recommended for                            surveillance. The colonoscopy date will be                            determined after pathology results from today's                            exam become available for review. Chrishawn Kring L. Loletha Carrow, MD 08/09/2020 4:26:05 PM This report has been signed electronically.

## 2020-08-09 NOTE — Progress Notes (Signed)
A/ox3, pleased with MAC, report to RN 

## 2020-08-10 ENCOUNTER — Telehealth: Payer: Self-pay | Admitting: *Deleted

## 2020-08-10 NOTE — Telephone Encounter (Signed)
  Follow up Call-  Call back number 08/09/2020  Post procedure Call Back phone  # [phone]  Permission to leave phone message Yes  Some recent data might be hidden     Patient questions:  Do you have a fever, pain , or abdominal swelling? No. Pain Score  0 *  Have you tolerated food without any problems? Yes.    Have you been able to return to your normal activities? Yes.    Do you have any questions about your discharge instructions: Diet   No. Medications  No. Follow up visit  No.  Do you have questions or concerns about your Care? No.  Actions: * If pain score is 4 or above: 1. No action needed, pain <4.Have you developed a fever since your procedure? no  2.   Have you had an respiratory symptoms (SOB or cough) since your procedure? no  3.   Have you tested positive for COVID 19 since your procedure no  4.   Have you had any family members/close contacts diagnosed with the COVID 19 since your procedure?  no   If yes to any of these questions please route to Joylene John, RN and Joella Prince, RN

## 2020-08-20 ENCOUNTER — Encounter: Payer: Self-pay | Admitting: Gastroenterology

## 2020-09-09 ENCOUNTER — Encounter: Payer: Self-pay | Admitting: Nurse Practitioner

## 2020-09-09 ENCOUNTER — Other Ambulatory Visit: Payer: Self-pay

## 2020-09-09 ENCOUNTER — Ambulatory Visit (INDEPENDENT_AMBULATORY_CARE_PROVIDER_SITE_OTHER): Payer: BC Managed Care – PPO | Admitting: Nurse Practitioner

## 2020-09-09 VITALS — BP 118/80

## 2020-09-09 DIAGNOSIS — N898 Other specified noninflammatory disorders of vagina: Secondary | ICD-10-CM | POA: Diagnosis not present

## 2020-09-09 DIAGNOSIS — B9689 Other specified bacterial agents as the cause of diseases classified elsewhere: Secondary | ICD-10-CM | POA: Diagnosis not present

## 2020-09-09 DIAGNOSIS — N760 Acute vaginitis: Secondary | ICD-10-CM | POA: Diagnosis not present

## 2020-09-09 LAB — WET PREP FOR TRICH, YEAST, CLUE

## 2020-09-09 MED ORDER — METRONIDAZOLE 500 MG PO TABS: 500.0000 mg | ORAL_TABLET | Freq: Two times a day (BID) | ORAL | 0 refills | Status: AC

## 2020-09-09 NOTE — Patient Instructions (Signed)
Bacterial Vaginosis  Bacterial vaginosis is a vaginal infection that occurs when the normal balance of bacteria in the vagina is disrupted. It results from an overgrowth of certain bacteria. This is the most common vaginal infection among women ages 15-44. Because bacterial vaginosis increases your risk for STIs (sexually transmitted infections), getting treated can help reduce your risk for chlamydia, gonorrhea, herpes, and HIV (human immunodeficiency virus). Treatment is also important for preventing complications in pregnant women, because this condition can cause an early (premature) delivery. What are the causes? This condition is caused by an increase in harmful bacteria that are normally present in small amounts in the vagina. However, the reason that the condition develops is not fully understood. What increases the risk? The following factors may make you more likely to develop this condition:  Having a new sexual partner or multiple sexual partners.  Having unprotected sex.  Douching.  Having an intrauterine device (IUD).  Smoking.  Drug and alcohol abuse.  Taking certain antibiotic medicines.  Being pregnant. You cannot get bacterial vaginosis from toilet seats, bedding, swimming pools, or contact with objects around you. What are the signs or symptoms? Symptoms of this condition include:  Grey or white vaginal discharge. The discharge can also be watery or foamy.  A fish-like odor with discharge, especially after sexual intercourse or during menstruation.  Itching in and around the vagina.  Burning or pain with urination. Some women with bacterial vaginosis have no signs or symptoms. How is this diagnosed? This condition is diagnosed based on:  Your medical history.  A physical exam of the vagina.  Testing a sample of vaginal fluid under a microscope to look for a large amount of bad bacteria or abnormal cells. Your health care provider may use a cotton swab or  a small wooden spatula to collect the sample. How is this treated? This condition is treated with antibiotics. These may be given as a pill, a vaginal cream, or a medicine that is put into the vagina (suppository). If the condition comes back after treatment, a second round of antibiotics may be needed. Follow these instructions at home: Medicines  Take over-the-counter and prescription medicines only as told by your health care provider.  Take or use your antibiotic as told by your health care provider. Do not stop taking or using the antibiotic even if you start to feel better. General instructions  If you have a female sexual partner, tell her that you have a vaginal infection. She should see her health care provider and be treated if she has symptoms. If you have a female sexual partner, he does not need treatment.  During treatment: ? Avoid sexual activity until you finish treatment. ? Do not douche. ? Avoid alcohol as directed by your health care provider. ? Avoid breastfeeding as directed by your health care provider.  Drink enough water and fluids to keep your urine clear or pale yellow.  Keep the area around your vagina and rectum clean. ? Wash the area daily with warm water. ? Wipe yourself from front to back after using the toilet.  Keep all follow-up visits as told by your health care provider. This is important. How is this prevented?  Do not douche.  Wash the outside of your vagina with warm water only.  Use protection when having sex. This includes latex condoms and dental dams.  Limit how many sexual partners you have. To help prevent bacterial vaginosis, it is best to have sex with just one partner (  monogamous).  Make sure you and your sexual partner are tested for STIs.  Wear cotton or cotton-lined underwear.  Avoid wearing tight pants and pantyhose, especially during summer.  Limit the amount of alcohol that you drink.  Do not use any products that contain  nicotine or tobacco, such as cigarettes and e-cigarettes. If you need help quitting, ask your health care provider.  Do not use illegal drugs. Where to find more information  Centers for Disease Control and Prevention: www.cdc.gov/std  American Sexual Health Association (ASHA): www.ashastd.org  [place] Department of Health and Human Services, Office on Women's Health: www.womenshealth.gov/ or https://www.womenshealth.gov/a-z-topics/bacterial-vaginosis Contact a health care provider if:  Your symptoms do not improve, even after treatment.  You have more discharge or pain when urinating.  You have a fever.  You have pain in your abdomen.  You have pain during sex.  You have vaginal bleeding between periods. Summary  Bacterial vaginosis is a vaginal infection that occurs when the normal balance of bacteria in the vagina is disrupted.  Because bacterial vaginosis increases your risk for STIs (sexually transmitted infections), getting treated can help reduce your risk for chlamydia, gonorrhea, herpes, and HIV (human immunodeficiency virus). Treatment is also important for preventing complications in pregnant women, because the condition can cause an early (premature) delivery.  This condition is treated with antibiotic medicines. These may be given as a pill, a vaginal cream, or a medicine that is put into the vagina (suppository). This information is not intended to replace advice given to you by your health care provider. Make sure you discuss any questions you have with your health care provider. Document Revised: 08/17/2017 Document Reviewed: 05/20/2016 Elsevier Patient Education  2020 Elsevier Inc.  

## 2020-09-09 NOTE — Progress Notes (Signed)
   Acute Office Visit  Subjective:    Patient ID: COURTENY EGLER, female    DOB: [birth year], 49 y.o.   MRN: [id number]   HPI 49 y.o. presents today for vaginal itching mostly just inside of introitus. She has not noticed an odor or increase in discharge.    Review of Systems  Constitutional: Negative.   Genitourinary: Negative for vaginal discharge and vaginal pain.       Vaginal itching       Objective:    Physical Exam Constitutional:      Appearance: Normal appearance.  Genitourinary:    General: Normal vulva.     Vagina: Vaginal discharge present. No erythema, tenderness or lesions.     BP 118/80 (Cuff Size: Large)   LMP  (LMP Unknown) Comment: unsure about Dec period but definitely had in Nov Wt Readings from Last 3 Encounters:  08/09/20 264 lb (119.7 kg)  06/16/20 264 lb 2 oz (119.8 kg)  04/13/20 (!) 262 lb (118.8 kg)   Wet prep + clue cells     Assessment & Plan:   Problem List Items Addressed This Visit   None   Visit Diagnoses    Bacterial vaginosis    -  Primary   Relevant Medications   metroNIDAZOLE (FLAGYL) 500 MG tablet   Vaginal itching       Relevant Orders   WET PREP FOR TRICH, YEAST, CLUE     Plan: Wet prep positive for clue cells. Flagyl 500 mg twice a day for 7 days. Instructed to take with food and avoid alcohol. If symptoms worsen or do not improve she will return to office. She is agreeable to plan.      Tamela Gammon [facility], 11:36 AM 09/09/2020

## 2020-10-05 ENCOUNTER — Encounter: Payer: Self-pay | Admitting: Physician Assistant

## 2020-10-05 ENCOUNTER — Telehealth (INDEPENDENT_AMBULATORY_CARE_PROVIDER_SITE_OTHER): Payer: 59 | Admitting: Physician Assistant

## 2020-10-05 ENCOUNTER — Other Ambulatory Visit: Payer: Self-pay | Admitting: Physician Assistant

## 2020-10-05 ENCOUNTER — Other Ambulatory Visit: Payer: Self-pay

## 2020-10-05 DIAGNOSIS — L089 Local infection of the skin and subcutaneous tissue, unspecified: Secondary | ICD-10-CM

## 2020-10-05 DIAGNOSIS — L729 Follicular cyst of the skin and subcutaneous tissue, unspecified: Secondary | ICD-10-CM

## 2020-10-05 DIAGNOSIS — Z1231 Encounter for screening mammogram for malignant neoplasm of breast: Secondary | ICD-10-CM | POA: Diagnosis not present

## 2020-10-05 MED ORDER — CEPHALEXIN 500 MG PO CAPS: 500.0000 mg | ORAL_CAPSULE | Freq: Two times a day (BID) | ORAL | 0 refills | Status: AC

## 2020-10-05 NOTE — Progress Notes (Signed)
Virtual Visit via Telephone Note  I connected with Nicole Mueller on 10/05/20 at  4:00 PM EST by telephone and verified that I am speaking with the correct person using two identifiers.  Location: Patient: Home Provider: [id number]   I discussed the limitations, risks, security and privacy concerns of performing an evaluation and management service by telephone and the availability of in person appointments. I also discussed with the patient that there may be a patient responsible charge related to this service. The patient expressed understanding and agreed to proceed.  History of Present Illness: Patient endorses bump between her breasts first noted 3 months ago. Has been getting slightly larger since then. Over the past 2 days she has noted redness and tenderness to the area after picking at the area. Denies similar lesion elsewhere.    Observations/Objective: No labored breathing.  Speech is clear and coherent with logical content.  Patient is alert and oriented at baseline.   Reference Pictures -- 10/05/20 in media.  Small SQ lesion noted of skin between the breasts with erythema. Seems most likely infected cyst versus boil.  Assessment and Plan: 1. Infected cyst of skin Cyst present for some time. In skin between breasts. Seems very superficial based on image provided and patient history. Symptomatic x  2 days. Will start Rx Keflex for infected cyst. Ok to proceed with routine screening mammogram as this seems unrelated. Follow-up in 1 week to see how symptoms are. Can consider excision once infection resolved.  - cephALEXin (KEFLEX) 500 MG capsule; Take 1 capsule (500 mg total) by mouth 2 (two) times daily for 7 days.  Dispense: 14 capsule; Refill: 0  2. Encounter for screening mammogram for malignant neoplasm of breast - MM Digital Screening; Future   Follow Up Instructions:  I discussed the assessment and treatment plan with the patient. The patient was provided an opportunity  to ask questions and all were answered. The patient agreed with the plan and demonstrated an understanding of the instructions.   The patient was advised to call back or seek an in-person evaluation if the symptoms worsen or if the condition fails to improve as anticipated.  I provided 12 minutes of non-face-to-face time during this encounter.   Leeanne Rio, PA-C

## 2020-10-05 NOTE — Progress Notes (Signed)
I have discussed the procedure for the virtual visit with the patient who has given consent to proceed with assessment and treatment.   Abbegayle Denault S Shamel Germond, CMA     

## 2020-11-17 ENCOUNTER — Other Ambulatory Visit: Payer: Self-pay | Admitting: Physician Assistant

## 2020-11-30 ENCOUNTER — Other Ambulatory Visit: Payer: Self-pay | Admitting: Nurse Practitioner

## 2020-11-30 DIAGNOSIS — Z1231 Encounter for screening mammogram for malignant neoplasm of breast: Secondary | ICD-10-CM

## 2020-12-27 ENCOUNTER — Encounter: Payer: 59 | Admitting: Nurse Practitioner

## 2021-01-18 ENCOUNTER — Other Ambulatory Visit: Payer: Self-pay

## 2021-01-19 ENCOUNTER — Encounter: Payer: Self-pay | Admitting: Nurse Practitioner

## 2021-01-19 ENCOUNTER — Ambulatory Visit (INDEPENDENT_AMBULATORY_CARE_PROVIDER_SITE_OTHER): Payer: 59 | Admitting: Nurse Practitioner

## 2021-01-19 ENCOUNTER — Ambulatory Visit
Admission: RE | Admit: 2021-01-19 | Discharge: 2021-01-19 | Disposition: A | Discharge status: Home / Self Care | Payer: 59 | Source: Ambulatory Visit | Attending: Nurse Practitioner | Admitting: Nurse Practitioner

## 2021-01-19 VITALS — BP 128/86 | HR 91 | Temp 98.2°F | Ht 62.25 in | Wt 259.2 lb

## 2021-01-19 DIAGNOSIS — Z13220 Encounter for screening for lipoid disorders: Secondary | ICD-10-CM | POA: Diagnosis not present

## 2021-01-19 DIAGNOSIS — E559 Vitamin D deficiency, unspecified: Secondary | ICD-10-CM

## 2021-01-19 DIAGNOSIS — Z0001 Encounter for general adult medical examination with abnormal findings: Secondary | ICD-10-CM | POA: Diagnosis not present

## 2021-01-19 DIAGNOSIS — F458 Other somatoform disorders: Secondary | ICD-10-CM | POA: Diagnosis not present

## 2021-01-19 DIAGNOSIS — Z1231 Encounter for screening mammogram for malignant neoplasm of breast: Secondary | ICD-10-CM

## 2021-01-19 DIAGNOSIS — R5382 Chronic fatigue, unspecified: Secondary | ICD-10-CM

## 2021-01-19 DIAGNOSIS — Z136 Encounter for screening for cardiovascular disorders: Secondary | ICD-10-CM

## 2021-01-19 DIAGNOSIS — R09A2 Foreign body sensation, throat: Secondary | ICD-10-CM

## 2021-01-19 LAB — COMPREHENSIVE METABOLIC PANEL
ALT: 14 U/L (ref 0–35)
AST: 16 U/L (ref 0–37)
Albumin: 4 g/dL (ref 3.5–5.2)
Alkaline Phosphatase: 112 U/L (ref 39–117)
BUN: 8 mg/dL (ref 6–23)
CO2: 28 mEq/L (ref 19–32)
Calcium: 9.4 mg/dL (ref 8.4–10.5)
Chloride: 103 mEq/L (ref 96–112)
Creatinine, Ser: 0.8 mg/dL (ref 0.40–1.20)
GFR: 87.47 mL/min (ref >60.00)
Glucose, Bld: 82 mg/dL (ref 70–99)
Potassium: 4.3 mEq/L (ref 3.5–5.1)
Sodium: 137 mEq/L (ref 135–145)
Total Bilirubin: 0.5 mg/dL (ref 0.2–1.2)
Total Protein: 6.9 g/dL (ref 6.0–8.3)

## 2021-01-19 LAB — CBC WITH DIFFERENTIAL/PLATELET
Basophils Absolute: 0.1 10*3/uL (ref 0.0–0.1)
Basophils Relative: 0.9 % (ref 0.0–3.0)
Eosinophils Absolute: 0.1 10*3/uL (ref 0.0–0.7)
Eosinophils Relative: 1.8 % (ref 0.0–5.0)
HCT: 42.6 % (ref 36.0–46.0)
Hemoglobin: 14.2 g/dL (ref 12.0–15.0)
Lymphocytes Relative: 31.8 % (ref 12.0–46.0)
Lymphs Abs: 2.5 10*3/uL (ref 0.7–4.0)
MCHC: 33.3 g/dL (ref 30.0–36.0)
MCV: 83.4 fl (ref 78.0–100.0)
Monocytes Absolute: 0.4 10*3/uL (ref 0.1–1.0)
Monocytes Relative: 4.9 % (ref 3.0–12.0)
Neutro Abs: 4.8 10*3/uL (ref 1.4–7.7)
Neutrophils Relative %: 60.6 % (ref 43.0–77.0)
Platelets: 240 10*3/uL (ref 150.0–400.0)
RBC: 5.11 Mil/uL (ref 3.87–5.11)
RDW: 13.6 % (ref 11.5–15.5)
WBC: 8 10*3/uL (ref 4.0–10.5)

## 2021-01-19 LAB — LIPID PANEL
Cholesterol: 194 mg/dL (ref 0–200)
HDL: 39.2 mg/dL (ref >39.00)
LDL Cholesterol: 140 mg/dL — ABNORMAL HIGH (ref 0–99)
NonHDL: 154.36
Total CHOL/HDL Ratio: 5
Triglycerides: 70 mg/dL (ref 0.0–149.0)
VLDL: 14 mg/dL (ref 0.0–40.0)

## 2021-01-19 LAB — FERRITIN: Ferritin: 41.1 ng/mL (ref 10.0–291.0)

## 2021-01-19 LAB — TSH: TSH: 2.42 u[IU]/mL (ref 0.35–4.50)

## 2021-01-19 MED ORDER — PANTOPRAZOLE SODIUM 40 MG PO TBEC: 40.0000 mg | DELAYED_RELEASE_TABLET | Freq: Every day | ORAL | 1 refills | Status: DC

## 2021-01-19 NOTE — Assessment & Plan Note (Addendum)
Chronic, onset 2018 Upper endoscopy completed 2018: normal No improvement with pantoprazole 40mg  daily. Current tobacco use Social consumption of ETOH  no weight loss, no night sweats, no dysphagia, no cervical lymphadenopathy.  Continue pantoprazole Encourage tobacco cessation Entered referral to ENT

## 2021-01-19 NOTE — Patient Instructions (Addendum)
Go to lab for blood draw.  You will be contacted to schedule appt with ENT. Maintain pantoprazole dose.  Schedule appt for dental cleaning every 32months Maintain appt for mammogram.  Start daily exercise and DASH diet  Preventive Care 16-48 Years Old, Female Preventive care refers to lifestyle choices and visits with your health care provider that can promote health and wellness. This includes:  A yearly physical exam. This is also called an annual wellness visit.  Regular dental and eye exams.  Immunizations.  Screening for certain conditions.  Healthy lifestyle choices, such as: ? Eating a healthy diet. ? Getting regular exercise. ? Not using drugs or products that contain nicotine and tobacco. ? Limiting alcohol use. What can I expect for my preventive care visit? Physical exam Your health care provider will check your:  Height and weight. These may be used to calculate your BMI (body mass index). BMI is a measurement that tells if you are at a healthy weight.  Heart rate and blood pressure.  Body temperature.  Skin for abnormal spots. Counseling Your health care provider may ask you questions about your:  Past medical problems.  Family's medical history.  Alcohol, tobacco, and drug use.  Emotional well-being.  Home life and relationship well-being.  Sexual activity.  Diet, exercise, and sleep habits.  Work and work Statistician.  Access to firearms.  Method of birth control.  Menstrual cycle.  Pregnancy history. What immunizations do I need? Vaccines are usually given at various ages, according to a schedule. Your health care provider will recommend vaccines for you based on your age, medical history, and lifestyle or other factors, such as travel or where you work.   What tests do I need? Blood tests  Lipid and cholesterol levels. These may be checked every 5 years, or more often if you are over 101 years old.  Hepatitis C test.  Hepatitis B  test. Screening  Lung cancer screening. You may have this screening every year starting at age 52 if you have a 30-pack-year history of smoking and currently smoke or have quit within the past 15 years.  Colorectal cancer screening. ? All adults should have this screening starting at age 1 and continuing until age 39. ? Your health care provider may recommend screening at age 47 if you are at increased risk. ? You will have tests every 1-10 years, depending on your results and the type of screening test.  Diabetes screening. ? This is done by checking your blood sugar (glucose) after you have not eaten for a while (fasting). ? You may have this done every 1-3 years.  Mammogram. ? This may be done every 1-2 years. ? Talk with your health care provider about when you should start having regular mammograms. This may depend on whether you have a family history of breast cancer.  BRCA-related cancer screening. This may be done if you have a family history of breast, ovarian, tubal, or peritoneal cancers.  Pelvic exam and Pap test. ? This may be done every 3 years starting at age 84. ? Starting at age 57, this may be done every 5 years if you have a Pap test in combination with an HPV test. Other tests  STD (sexually transmitted disease) testing, if you are at risk.  Bone density scan. This is done to screen for osteoporosis. You may have this scan if you are at high risk for osteoporosis. Talk with your health care provider about your test results, treatment options, and  options, and if necessary, the need for more tests. Follow these instructions at home: Eating and drinking  Eat a diet that includes fresh fruits and vegetables, whole grains, lean protein, and low-fat dairy products.  Take vitamin and mineral supplements as recommended by your health care provider.  Do not drink alcohol if: ? Your health care provider tells you not to drink. ? You are pregnant, may be pregnant, or are planning  to become pregnant.  If you drink alcohol: ? Limit how much you have to 0-1 drink a day. ? Be aware of how much alcohol is in your drink. In the [place] one drink equals one 12 oz bottle of beer (355 mL), one 5 oz glass of wine (148 mL), or one 1 oz glass of hard liquor (44 mL).   Lifestyle  Take daily care of your teeth and gums. Brush your teeth every morning and night with fluoride toothpaste. Floss one time each day.  Stay active. Exercise for at least 30 minutes 5 or more days each week.  Do not use any products that contain nicotine or tobacco, such as cigarettes, e-cigarettes, and chewing tobacco. If you need help quitting, ask your health care provider.  Do not use drugs.  If you are sexually active, practice safe sex. Use a condom or other form of protection to prevent STIs (sexually transmitted infections).  If you do not wish to become pregnant, use a form of birth control. If you plan to become pregnant, see your health care provider for a prepregnancy visit.  If told by your health care provider, take low-dose aspirin daily starting at age 50.  Find healthy ways to cope with stress, such as: ? Meditation, yoga, or listening to music. ? Journaling. ? Talking to a trusted person. ? Spending time with friends and family. Safety  Always wear your seat belt while driving or riding in a vehicle.  Do not drive: ? If you have been drinking alcohol. Do not ride with someone who has been drinking. ? When you are tired or distracted. ? While texting.  Wear a helmet and other protective equipment during sports activities.  If you have firearms in your house, make sure you follow all gun safety procedures. What's next?  Visit your health care provider once a year for an annual wellness visit.  Ask your health care provider how often you should have your eyes and teeth checked.  Stay up to date on all vaccines. This information is not intended to replace advice given to you  by your health care provider. Make sure you discuss any questions you have with your health care provider. Document Revised: 06/08/2020 Document Reviewed: 05/16/2018 Elsevier Patient Education  2021 Elsevier Inc.  

## 2021-01-19 NOTE — Progress Notes (Signed)
Subjective:    Patient ID: Nicole Mueller, female    DOB: [birth year], 50 y.o.   MRN: [id number]  Patient presents today for CPE   HPI  Globus hystericus Chronic, onset 2018 Upper endoscopy completed 2018: normal No improvement with pantoprazole 40mg  daily. Current tobacco use Social consumption of ETOH  no weight loss, no night sweats, no dysphagia, no cervical lymphadenopathy.  Continue pantoprazole Encourage tobacco cessation Entered referral to ENT  Sexual History (orientation,birth control, marital status, STD):married, sexually active, monthly menstrual cycle, denies need for STD screen, has upcomign appt with GYN for repeat PAP.  Depression/Suicide: Depression screen [facility] ([place] 2/9 10/05/2020 10/06/2019 01/15/2019 09/03/2018 01/23/2018 09/20/2016 09/20/2016  Decreased Interest 0 0 0 1 0 0 0  Down, Depressed, Hopeless 0 0 0 1 0 0 0  PHQ - 2 Score 0 0 0 2 0 0 0  Altered sleeping - 0 2 1 - - 0  Tired, decreased energy - 1 0 1 - - 0  Change in appetite - 0 0 0 - - 0  Feeling bad or failure about yourself  - 0 0 1 - - 0  Trouble concentrating - 0 0 0 - - 0  Moving slowly or fidgety/restless - 0 0 1 - - 0  Suicidal thoughts - 0 0 0 - - 0  PHQ-9 Score - 1 2 6  - - 0  Difficult doing work/chores - Somewhat difficult Somewhat difficult Somewhat difficult - - -   No flowsheet data found.  Vision:will schedule  Dental:will schedule  Immunizations: (TDAP, Hep C screen, Pneumovax, Influenza, zoster)  Health Maintenance  Topic Date Due  . Mammogram  08/28/2020  .  Hepatitis C: One time screening is recommended by Center for Disease Control  (CDC) for  adults born from [birth year] through 1963.   01/19/2022*  . Flu Shot  04/18/2021  . Pap Smear  12/22/2021  . Colon Cancer Screening  08/10/2023  . Tetanus Vaccine  07/27/2025  . COVID-19 Vaccine  Completed  . HIV Screening  Completed  . HPV Vaccine  Aged Out  *Topic was postponed. The date shown is not the original due date.    Diet:regular. Exercise: none Weight:  Wt Readings from Last 3 Encounters:  01/19/21 259 lb 3.2 oz (117.6 kg)  08/09/20 264 lb (119.7 kg)  06/16/20 264 lb 2 oz (119.8 kg)   Fall Risk: Fall Risk  01/19/2021 10/06/2019 09/03/2018 01/23/2018 09/20/2016 02/23/2016 07/01/2014  Falls in the past year? 0 0 0 No No No No  Number falls in past yr: 0 0 0 - - - -  Injury with Fall? 0 0 0 - - - -  Risk for fall due to : No Fall Risks No Fall Risks - - - - -  Follow up Falls evaluation completed Falls evaluation completed Falls evaluation completed - - - -   Medications and allergies reviewed with patient and updated if appropriate.  Patient Active Problem List   Diagnosis Date Noted  . Globus hystericus 01/19/2021  . Chronic fatigue 01/19/2021  . Tobacco abuse 09/03/2018  . Skin lesion 09/03/2018  . Visit for preventive health examination 07/28/2015  . Rhinitis, allergic 02/26/2015  . Morbid obesity ([place]) 10/17/2013  . Acid reflux 06/04/2013    Current Outpatient Medications on File Prior to Visit  Medication Sig Dispense Refill  . cholecalciferol (VITAMIN D3) 25 MCG (1000 UNIT) tablet Take 1,000 Units by mouth daily.     No current facility-administered medications on file prior  to visit.    Past Medical History:  Diagnosis Date  . Chicken pox   . 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 Elevated blood pressure   . Fibroids   . GERD (gastroesophageal reflux disease)   . 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 Hypertension   . Hypolipidemia   . Ovarian cyst   . STD (sexually transmitted disease)    gc over 43yrs ago  . Substance abuse ([place])    hx of - 18 years ago  . UTI (lower urinary tract infection)     Past Surgical History:  Procedure Laterality Date  . FOOT SURGERY Right    bone spur  . WISDOM TOOTH EXTRACTION      Social History   Socioeconomic History  . Marital status:  Married    Spouse name: Not on file  . Number of children: Not on file  . Years of education: Not on file  . Highest education level: Not on file  Occupational History  . Not on file  Tobacco Use  . Smoking status: Current Every Day Smoker    Packs/day: 0.50    Years: 27.00    Pack years: 13.50    Types: Cigarettes  . Smokeless tobacco: Never Used  . Tobacco comment: stopped smoking on sunday 06/13/20  Vaping Use  . Vaping Use: Never used  Substance and Sexual Activity  . Alcohol use: Yes    Alcohol/week: 0.0 standard drinks    Comment: Rare  . Drug use: No  . Sexual activity: Yes    Partners: Male    Birth control/protection: None    Comment: 1st intercourse 50 yo- More than 5 partners  Other Topics Concern  . Not on file  Social History Narrative  . Not on file   Social Determinants of Health   Financial Resource Strain: Not on file  Food Insecurity: Not on file  Transportation Needs: Not on file  Physical Activity: Not on file  Stress: Not on file  Social Connections: Not on file    Family History  Problem Relation Age of Onset  . Alcoholism Father   . Drug abuse Father   . Prostate cancer Father   . Hypertension Mother   . Diabetes Mother   . Hyperlipidemia Mother   . Heart murmur Mother   . Alcoholism Maternal Grandfather   . Dementia Maternal Grandfather   . Arthritis Maternal Grandmother   . Breast cancer Maternal Grandmother 60  . Stroke Maternal Grandmother   . Hypertension Maternal Grandmother   . Stroke Maternal Aunt   . Colon cancer Maternal Aunt 65  . Hyperlipidemia Maternal Aunt   . Esophageal cancer Maternal Aunt   . Alcoholism Maternal Uncle        x2  . Alcoholism Maternal Aunt   . Aneurysm Maternal Aunt   . Colon cancer Maternal Aunt   . Hypertension Maternal Aunt        x2  . Stroke Brother   . Drug abuse Brother   . Heart disease Brother   . Bone cancer Paternal Aunt   . Stomach cancer Neg Hx        Review of Systems   Constitutional: Positive for malaise/fatigue. Negative for fever and weight loss.  HENT: Negative for congestion and sore throat.   Eyes:       Negative for visual changes  Respiratory: Negative  for cough and shortness of breath.   Cardiovascular: Negative for chest pain, palpitations and leg swelling.  Gastrointestinal: Negative for blood in stool, constipation, diarrhea and heartburn.  Genitourinary: Negative for dysuria, frequency and urgency.  Musculoskeletal: Negative for falls, joint pain and myalgias.  Skin: Negative for rash.  Neurological: Negative for dizziness, sensory change and headaches.  Endo/Heme/Allergies: Does not bruise/bleed easily.  Psychiatric/Behavioral: Negative for depression, substance abuse and suicidal ideas. The patient is not nervous/anxious.     Objective:   Vitals:   01/19/21 0921  BP: 128/86  Pulse: 91  Temp: 98.2 F (36.8 C)  SpO2: 99%    Body mass index is 47.03 kg/m.   Physical Examination:  Physical Exam Vitals reviewed.  Constitutional:      General: She is not in acute distress.    Appearance: She is well-developed. She is obese.  HENT:     Right Ear: Tympanic membrane, ear canal and external ear normal.     Left Ear: Tympanic membrane, ear canal and external ear normal.  Eyes:     Extraocular Movements: Extraocular movements intact.     Conjunctiva/sclera: Conjunctivae normal.  Neck:     Thyroid: No thyroid mass or thyroid tenderness.  Cardiovascular:     Rate and Rhythm: Normal rate and regular rhythm.     Pulses: Normal pulses.     Heart sounds: Normal heart sounds.  Pulmonary:     Effort: Pulmonary effort is normal. No respiratory distress.     Breath sounds: Normal breath sounds.  Chest:     Chest wall: No tenderness.  Breasts:     Right: Normal. No axillary adenopathy or supraclavicular adenopathy.     Left: Normal. No axillary adenopathy or supraclavicular adenopathy.    Abdominal:     General: Bowel sounds  are normal.     Palpations: Abdomen is soft.  Genitourinary:    Comments: Deferred to GYN per patient Musculoskeletal:        General: Normal range of motion.     Cervical back: Normal range of motion and neck supple.     Right lower leg: No edema.     Left lower leg: No edema.  Lymphadenopathy:     Cervical: No cervical adenopathy.     Upper Body:     Right upper body: No supraclavicular, axillary or pectoral adenopathy.     Left upper body: No supraclavicular, axillary or pectoral adenopathy.  Skin:    General: Skin is warm and dry.  Neurological:     Mental Status: She is alert and oriented to person, place, and time.     Deep Tendon Reflexes: Reflexes are normal and symmetric.  Psychiatric:        Attention and Perception: Attention normal.        Mood and Affect: Mood normal.        Speech: Speech normal.        Behavior: Behavior normal.        Thought Content: Thought content normal.    ASSESSMENT and PLAN: This visit occurred during the SARS-CoV-2 public health emergency.  Safety protocols were in place, including screening questions prior to the visit, additional usage of staff PPE, and extensive cleaning of exam room while observing appropriate contact time as indicated for disinfecting solutions.   Nicole Mueller was seen today for establish care.  Diagnoses and all orders for this visit:  Encounter for preventative adult health care exam with abnormal findings -     Comprehensive  metabolic panel -     Lipid panel -     CBC w/Diff  Encounter for lipid screening for cardiovascular disease -     Lipid panel  Vitamin D deficiency -     Vitamin D 1,25 dihydroxy  Chronic fatigue -     TSH -     Ferritin  Globus hystericus -     Ambulatory referral to ENT -     pantoprazole (PROTONIX) 40 MG tablet; Take 1 tablet (40 mg total) by mouth daily.      Problem List Items Addressed This Visit      Other   Chronic fatigue   Relevant Orders   TSH   Ferritin    Globus hystericus    Chronic, onset 2018 Upper endoscopy completed 2018: normal No improvement with pantoprazole 40mg  daily. Current tobacco use Social consumption of ETOH  no weight loss, no night sweats, no dysphagia, no cervical lymphadenopathy.  Continue pantoprazole Encourage tobacco cessation Entered referral to ENT      Relevant Medications   pantoprazole (PROTONIX) 40 MG tablet   Other Relevant Orders   Ambulatory referral to ENT    Other Visit Diagnoses    Encounter for preventative adult health care exam with abnormal findings    -  Primary   Relevant Orders   Comprehensive metabolic panel   Lipid panel   CBC w/Diff   Encounter for lipid screening for cardiovascular disease       Relevant Orders   Lipid panel   Vitamin D deficiency       Relevant Orders   Vitamin D 1,25 dihydroxy      Follow up: Return in about 12 days (around 01/31/2021) for depression and tobacco use (70mins).  Wilfred Lacy, NP

## 2021-01-22 LAB — VITAMIN D 1,25 DIHYDROXY
Vitamin D 1, 25 (OH)2 Total: 55 pg/mL (ref 18–72)
Vitamin D2 1, 25 (OH)2: <8 pg/mL
Vitamin D3 1, 25 (OH)2: 55 pg/mL

## 2021-01-26 ENCOUNTER — Encounter: Payer: Self-pay | Admitting: Nurse Practitioner

## 2021-01-28 ENCOUNTER — Other Ambulatory Visit: Payer: Self-pay

## 2021-01-31 ENCOUNTER — Ambulatory Visit: Payer: 59 | Admitting: Nurse Practitioner

## 2021-01-31 ENCOUNTER — Other Ambulatory Visit: Payer: Self-pay

## 2021-01-31 ENCOUNTER — Encounter: Payer: Self-pay | Admitting: Nurse Practitioner

## 2021-01-31 ENCOUNTER — Ambulatory Visit (INDEPENDENT_AMBULATORY_CARE_PROVIDER_SITE_OTHER): Payer: 59 | Admitting: Nurse Practitioner

## 2021-01-31 VITALS — BP 138/90 | HR 82 | Temp 98.1°F | Ht 63.0 in | Wt 259.6 lb

## 2021-01-31 VITALS — BP 122/76 | Ht 63.0 in | Wt 260.0 lb

## 2021-01-31 DIAGNOSIS — Z01419 Encounter for gynecological examination (general) (routine) without abnormal findings: Secondary | ICD-10-CM

## 2021-01-31 DIAGNOSIS — F17209 Nicotine dependence, unspecified, with unspecified nicotine-induced disorders: Secondary | ICD-10-CM | POA: Diagnosis not present

## 2021-01-31 DIAGNOSIS — Z716 Tobacco abuse counseling: Secondary | ICD-10-CM | POA: Diagnosis not present

## 2021-01-31 DIAGNOSIS — Z720 Tobacco use: Secondary | ICD-10-CM

## 2021-01-31 DIAGNOSIS — N898 Other specified noninflammatory disorders of vagina: Secondary | ICD-10-CM | POA: Diagnosis not present

## 2021-01-31 LAB — WET PREP FOR TRICH, YEAST, CLUE

## 2021-01-31 MED ORDER — BUPROPION HCL ER (SR) 150 MG PO TB12: 150.0000 mg | ORAL_TABLET | Freq: Two times a day (BID) | ORAL | 5 refills | Status: DC

## 2021-01-31 MED ORDER — NICOTINE 14 MG/24HR TD PT24: 14.0000 mg | MEDICATED_PATCH | Freq: Every day | TRANSDERMAL | 0 refills | Status: DC

## 2021-01-31 NOTE — Patient Instructions (Addendum)
Pick quit date, start wellbutrin 1week prior to that quit date.  Take 1tab daily with meal x 1week then 1tab BID with meal continuously. Avoid tobacco use trigger: Advised to quit tobacco use in her home and car, avoid individuals whom smoke, start wellbutrin as discussed, start daily exercise-walking 15-66mins after meals, use nicotine patch to curb cravings, chew of sugarless gum when driving. F/up in 22month you will be contacted to participate in tobacco use cessation program.  Smoking Tobacco Information, Adult Smoking tobacco can be harmful to your health. Tobacco contains a poisonous (toxic), colorless chemical called nicotine. Nicotine is addictive. It changes the brain and can make it hard to stop smoking. Tobacco also has other toxic chemicals that can hurt your body and raise your risk of many cancers. How can smoking tobacco affect me? Smoking tobacco puts you at risk for:  Cancer. Smoking is most commonly associated with lung cancer, but can also lead to cancer in other parts of the body.  Chronic obstructive pulmonary disease (COPD). This is a long-term lung condition that makes it hard to breathe. It also gets worse over time.  High blood pressure (hypertension), heart disease, stroke, or heart attack.  Lung infections, such as pneumonia.  Cataracts. This is when the lenses in the eyes become clouded.  Digestive problems. This may include peptic ulcers, heartburn, and gastroesophageal reflux disease (GERD).  Oral health problems, such as gum disease and tooth loss.  Loss of taste and smell. Smoking can affect your appearance by causing:  Wrinkles.  Yellow or stained teeth, fingers, and fingernails. Smoking tobacco can also affect your social life, because:  It may be challenging to find places to smoke when away from home. Many workplaces, [employer], hotels, and public places are tobacco-free.  Smoking is expensive. This is due to the cost of tobacco and the  long-term costs of treating health problems from smoking.  Secondhand smoke may affect those around you. Secondhand smoke can cause lung cancer, breathing problems, and heart disease. Children of smokers have a higher risk for: ? Sudden infant death syndrome (SIDS). ? Ear infections. ? Lung infections. If you currently smoke tobacco, quitting now can help you:  Lead a longer and healthier life.  Look, smell, breathe, and feel better over time.  Save money.  Protect others from the harms of secondhand smoke. What actions can I take to prevent health problems? Quit smoking  Do not start smoking. Quit if you already do.  Make a plan to quit smoking and commit to it. Look for programs to help you and ask your health care provider for recommendations and ideas.  Set a date and write down all the reasons you want to quit.  Let your friends and family know you are quitting so they can help and support you. Consider finding friends who also want to quit. It can be easier to quit with someone else, so that you can support each other.  Talk with your health care provider about using nicotine replacement medicines to help you quit, such as gum, lozenges, patches, sprays, or pills.  Do not replace cigarette smoking with electronic cigarettes, which are commonly called e-cigarettes. The safety of e-cigarettes is not known, and some may contain harmful chemicals.  If you try to quit but return to smoking, stay positive. It is common to slip up when you first quit, so take it one day at a time.  Be prepared for cravings. When you feel the urge to smoke, chew gum  or suck on hard candy.   Lifestyle  Stay busy and take care of your body.  Drink enough fluid to keep your urine pale yellow.  Get plenty of exercise and eat a healthy diet. This can help prevent weight gain after quitting.  Monitor your eating habits. Quitting smoking can cause you to have a larger appetite than when you  smoke.  Find ways to relax. Go out with friends or family to a movie or a restaurant where people do not smoke.  Ask your health care provider about having regular tests (screenings) to check for cancer. This may include blood tests, imaging tests, and other tests.  Find ways to manage your stress, such as meditation, yoga, or exercise. Where to find support To get support to quit smoking, consider:  Asking your health care provider for more information and resources.  Taking classes to learn more about quitting smoking.  Looking for local organizations that offer resources about quitting smoking.  Joining a support group for people who want to quit smoking in your local community.  Calling the smokefree.gov counselor helpline: 1-800-Quit-Now (631) 090-6598) Where to find more information You may find more information about quitting smoking from:  HelpGuide.org: www.helpguide.org  https://hall.com/: smokefree.gov  American Lung Association: www.lung.org Contact a health care provider if you:  Have problems breathing.  Notice that your lips, nose, or fingers turn blue.  Have chest pain.  Are coughing up blood.  Feel faint or you pass out.  Have other health changes that cause you to worry. Summary  Smoking tobacco can negatively affect your health, the health of those around you, your finances, and your social life.  Do not start smoking. Quit if you already do. If you need help quitting, ask your health care provider.  Think about joining a support group for people who want to quit smoking in your local community. There are many effective programs that will help you to quit this behavior. This information is not intended to replace advice given to you by your health care provider. Make sure you discuss any questions you have with your health care provider. Document Revised: 05/30/2019 Document Reviewed: 09/19/2016 Elsevier Patient Education  2021 [employer].

## 2021-01-31 NOTE — Assessment & Plan Note (Signed)
1/4ppd x 28yrs, quit in past x 96yrs with use of wellbutrin, resumed due to increase stress. Other triggers: in company with others, driving and after meals. She smokes in her home and her car. Personal Hx of crack cocaine use, quit 24yrs ago. She declined referral to therapist today.  Advised to quit tobacco use in her home and car, avoid individuals whom smoke, start wellbutrin as discussed, start daily exercise-walking 15-57mins after meals, use nicotine patch to curb cravings, chew of sugarless gum when driving. F/up in 75month

## 2021-01-31 NOTE — Progress Notes (Signed)
Subjective:  Patient ID: Nicole Mueller, female    DOB: 12-Sep-1971  Age: 50 y.o. MRN: [id number]  CC: Follow-up (2 week f/u for depression and tobacco use./Pt states she still smokes daily)  HPI  Tobacco abuse 1/4ppd x 31yrs, quit in past x 55yrs with use of wellbutrin, resumed due to increase stress. Other triggers: in company with others, driving and after meals. She smokes in her home and her car. Personal Hx of crack cocaine use, quit 42yrs ago. She declined referral to therapist today.  Advised to quit tobacco use in her home and car, avoid individuals whom smoke, start wellbutrin as discussed, start daily exercise-walking 15-61mins after meals, use nicotine patch to curb cravings, chew of sugarless gum when driving. F/up in 70month  Depression screen [facility] 2/9 01/19/2021 10/05/2020 10/06/2019  Decreased Interest 2 0 0  Down, Depressed, Hopeless 1 0 0  PHQ - 2 Score 3 0 0  Altered sleeping 1 - 0  Tired, decreased energy 2 - 1  Change in appetite 1 - 0  Feeling bad or failure about yourself  1 - 0  Trouble concentrating 1 - 0  Moving slowly or fidgety/restless 0 - 0  Suicidal thoughts 0 - 0  PHQ-9 Score 9 - 1  Difficult doing work/chores Somewhat difficult - Somewhat difficult   GAD 7 : Generalized Anxiety Score 01/19/2021  Nervous, Anxious, on Edge 1  Control/stop worrying 2  Worry too much - different things 2  Trouble relaxing 1  Restless 0  Easily annoyed or irritable 2  Afraid - awful might happen 2  Total GAD 7 Score 10  Anxiety Difficulty Somewhat difficult   Reviewed past Medical, Social and Family history today.  Outpatient Medications Prior to Visit  Medication Sig Dispense Refill  . cholecalciferol (VITAMIN D3) 25 MCG (1000 UNIT) tablet Take 1,000 Units by mouth daily.    . pantoprazole (PROTONIX) 40 MG tablet Take 1 tablet (40 mg total) by mouth daily. 90 tablet 1   No facility-administered medications prior to visit.    ROS See HPI  Objective:  BP  138/90 (BP Location: Left Arm, Patient Position: Sitting, Cuff Size: Large)   Pulse 82   Temp 98.1 F (36.7 C) (Temporal)   Ht 5\' 3"  (1.6 m)   Wt 259 lb 9.6 oz (117.8 kg)   SpO2 98%   BMI 45.99 kg/m   Physical Exam  Assessment & Plan:  This visit occurred during the SARS-CoV-2 public health emergency.  Safety protocols were in place, including screening questions prior to the visit, additional usage of staff PPE, and extensive cleaning of exam room while observing appropriate contact time as indicated for disinfecting solutions.   Nicole Mueller was seen today for follow-up.  Diagnoses and all orders for this visit:  Tobacco use disorder, continuous -     Ambulatory referral to Smoking Cessation Program -     buPROPion (WELLBUTRIN SR) 150 MG 12 hr tablet; Take 1 tablet (150 mg total) by mouth 2 (two) times daily. -     nicotine (NICODERM CQ) 14 mg/24hr patch; Place 1 patch (14 mg total) onto the skin daily.  Encounter for smoking cessation counseling -     Ambulatory referral to Smoking Cessation Program -     buPROPion (WELLBUTRIN SR) 150 MG 12 hr tablet; Take 1 tablet (150 mg total) by mouth 2 (two) times daily. -     nicotine (NICODERM CQ) 14 mg/24hr patch; Place 1 patch (14 mg total) onto the  skin daily.  Tobacco abuse   Problem List Items Addressed This Visit      Other   Tobacco abuse    1/4ppd x 26yrs, quit in past x 54yrs with use of wellbutrin, resumed due to increase stress. Other triggers: in company with others, driving and after meals. She smokes in her home and her car. Personal Hx of crack cocaine use, quit 41yrs ago. She declined referral to therapist today.  Advised to quit tobacco use in her home and car, avoid individuals whom smoke, start wellbutrin as discussed, start daily exercise-walking 15-67mins after meals, use nicotine patch to curb cravings, chew of sugarless gum when driving. F/up in 31month       Other Visit Diagnoses    Tobacco use disorder,  continuous    -  Primary   Relevant Medications   buPROPion (WELLBUTRIN SR) 150 MG 12 hr tablet   nicotine (NICODERM CQ) 14 mg/24hr patch   Other Relevant Orders   Ambulatory referral to Smoking Cessation Program   Encounter for smoking cessation counseling       Relevant Medications   buPROPion (WELLBUTRIN SR) 150 MG 12 hr tablet   nicotine (NICODERM CQ) 14 mg/24hr patch   Other Relevant Orders   Ambulatory referral to Smoking Cessation Program      Follow-up: Return in about 4 weeks (around 02/28/2021) for depression and tobacco use (35mins).  Wilfred Lacy, NP

## 2021-01-31 NOTE — Patient Instructions (Signed)
Health Maintenance, Female Adopting a healthy lifestyle and getting preventive care are important in promoting health and wellness. Ask your health care provider about:  The right schedule for you to have regular tests and exams.  Things you can do on your own to prevent diseases and keep yourself healthy. What should I know about diet, weight, and exercise? Eat a healthy diet  Eat a diet that includes plenty of vegetables, fruits, low-fat dairy products, and lean protein.  Do not eat a lot of foods that are high in solid fats, added sugars, or sodium.   Maintain a healthy weight Body mass index (BMI) is used to identify weight problems. It estimates body fat based on height and weight. Your health care provider can help determine your BMI and help you achieve or maintain a healthy weight. Get regular exercise Get regular exercise. This is one of the most important things you can do for your health. Most adults should:  Exercise for at least 150 minutes each week. The exercise should increase your heart rate and make you sweat (moderate-intensity exercise).  Do strengthening exercises at least twice a week. This is in addition to the moderate-intensity exercise.  Spend less time sitting. Even light physical activity can be beneficial. Watch cholesterol and blood lipids Have your blood tested for lipids and cholesterol at 48 years of age, then have this test every 5 years. Have your cholesterol levels checked more often if:  Your lipid or cholesterol levels are high.  You are older than 48 years of age.  You are at high risk for heart disease. What should I know about cancer screening? Depending on your health history and family history, you may need to have cancer screening at various ages. This may include screening for:  Breast cancer.  Cervical cancer.  Colorectal cancer.  Skin cancer.  Lung cancer. What should I know about heart disease, diabetes, and high blood  pressure? Blood pressure and heart disease  High blood pressure causes heart disease and increases the risk of stroke. This is more likely to develop in people who have high blood pressure readings, are of African descent, or are overweight.  Have your blood pressure checked: ? Every 3-5 years if you are 18-39 years of age. ? Every year if you are 40 years old or older. Diabetes Have regular diabetes screenings. This checks your fasting blood sugar level. Have the screening done:  Once every three years after age 40 if you are at a normal weight and have a low risk for diabetes.  More often and at a younger age if you are overweight or have a high risk for diabetes. What should I know about preventing infection? Hepatitis B If you have a higher risk for hepatitis B, you should be screened for this virus. Talk with your health care provider to find out if you are at risk for hepatitis B infection. Hepatitis C Testing is recommended for:  Everyone born from 1945 through 1965.  Anyone with known risk factors for hepatitis C. Sexually transmitted infections (STIs)  Get screened for STIs, including gonorrhea and chlamydia, if: ? You are sexually active and are younger than 48 years of age. ? You are older than 48 years of age and your health care provider tells you that you are at risk for this type of infection. ? Your sexual activity has changed since you were last screened, and you are at increased risk for chlamydia or gonorrhea. Ask your health care provider   if you are at risk.  Ask your health care provider about whether you are at high risk for HIV. Your health care provider may recommend a prescription medicine to help prevent HIV infection. If you choose to take medicine to prevent HIV, you should first get tested for HIV. You should then be tested every 3 months for as long as you are taking the medicine. Pregnancy  If you are about to stop having your period (premenopausal) and  you may become pregnant, seek counseling before you get pregnant.  Take 400 to 800 micrograms (mcg) of folic acid every day if you become pregnant.  Ask for birth control (contraception) if you want to prevent pregnancy. Osteoporosis and menopause Osteoporosis is a disease in which the bones lose minerals and strength with aging. This can result in bone fractures. If you are 65 years old or older, or if you are at risk for osteoporosis and fractures, ask your health care provider if you should:  Be screened for bone loss.  Take a calcium or vitamin D supplement to lower your risk of fractures.  Be given hormone replacement therapy (HRT) to treat symptoms of menopause. Follow these instructions at home: Lifestyle  Do not use any products that contain nicotine or tobacco, such as cigarettes, e-cigarettes, and chewing tobacco. If you need help quitting, ask your health care provider.  Do not use street drugs.  Do not share needles.  Ask your health care provider for help if you need support or information about quitting drugs. Alcohol use  Do not drink alcohol if: ? Your health care provider tells you not to drink. ? You are pregnant, may be pregnant, or are planning to become pregnant.  If you drink alcohol: ? Limit how much you use to 0-1 drink a day. ? Limit intake if you are breastfeeding.  Be aware of how much alcohol is in your drink. In the [place] one drink equals one 12 oz bottle of beer (355 mL), one 5 oz glass of wine (148 mL), or one 1 oz glass of hard liquor (44 mL). General instructions  Schedule regular health, dental, and eye exams.  Stay current with your vaccines.  Tell your health care provider if: ? You often feel depressed. ? You have ever been abused or do not feel safe at home. Summary  Adopting a healthy lifestyle and getting preventive care are important in promoting health and wellness.  Follow your health care provider's instructions about healthy  diet, exercising, and getting tested or screened for diseases.  Follow your health care provider's instructions on monitoring your cholesterol and blood pressure. This information is not intended to replace advice given to you by your health care provider. Make sure you discuss any questions you have with your health care provider. Document Revised: 08/28/2018 Document Reviewed: 08/28/2018 Elsevier Patient Education  2021 [employer]vier Inc[employer]  

## 2021-01-31 NOTE — Progress Notes (Signed)
   Nicole Mueller 12/15/1970 [id number]   History:  50 y.o. [id number] presents for annual exam. She complains of vaginal itching that comes and goes, mostly near the end of her menses. Denies discharge or odor. Monthly cycles/no contraception. 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 Normal mammogram history. Smoker - 1/4 ppd, was seen today by PCP for cessation counseling and was started on Wellbutrin and Nicoderm patch and was provided with information for cessation program.   Gynecologic History Patient's last menstrual period was 12/21/2020. Period Cycle (Days): 28 Period Duration (Days): 5 Period Pattern: Regular Menstrual Flow: Moderate Dysmenorrhea: (!) Mild Dysmenorrhea Symptoms: Cramping Contraception/Family planning: none  Health Maintenance Last Pap: 12/24/2019. Results were: normal Last mammogram:01/19/2021. Results were: normal Last colonoscopy: 07/2020. Results were: polyps, 5-year recall Last Dexa: Not indicated  Past medical history, past surgical history, family history and social history were all reviewed and documented in the EPIC chart. Married. Works for [employer] 48 yo daughter in [place].   ROS:  A ROS was performed and pertinent positives and negatives are included.  Exam:  Vitals:   01/31/21 1557  BP: 122/76  Weight: 260 lb (117.9 kg)  Height: 5\' 3"  (1.6 m)   Body mass index is 46.06 kg/m.  General appearance:  Normal Thyroid:  Symmetrical, normal in size, without palpable masses or nodularity. Respiratory  Auscultation:  Clear without wheezing or rhonchi Cardiovascular  Auscultation:  Regular rate, without rubs, murmurs or gallops  Edema/varicosities:  Not grossly evident Abdominal  Soft,nontender, without masses, guarding or rebound.  Liver/spleen:  No organomegaly noted  Hernia:  None appreciated  Skin  Inspection:  Grossly normal Breasts: Examined lying and sitting.   Right: Without masses, retractions, nipple discharge or axillary  adenopathy.   Left: Without masses, retractions, nipple discharge or axillary adenopathy. Genitourinary   Inguinal/mons:  Normal without inguinal adenopathy  External genitalia:  Normal appearing vulva with no masses, tenderness, or lesions  BUS/Urethra/Skene's glands:  Normal  Vagina:  Normal appearing with normal color and discharge, no lesions  Cervix:  Normal appearing without discharge or lesions  Uterus:  Normal in size, shape and contour.  Midline and mobile, nontender  Adnexa/parametria:     Rt: Normal in size, without masses or tenderness.   Lt: Normal in size, without masses or tenderness.  Anus and perineum: Normal  Digital rectal exam: Normal sphincter tone without palpated masses or tenderness  Wet prep negative  Assessment/Plan:  50 y.o. [id number] for annual exam.   Well female exam with routine gynecological exam - Education provided on SBEs, importance of preventative screenings, current guidelines, high calcium diet, regular exercise, and multivitamin daily. Labs with PCP.   Vaginal itching - Plan: WET PREP FOR [place], YEAST, CLUE. Negative wet prep.   Screening for cervical cancer - 2016 negative cytology positive HPV, subsequent paps normal. Will repeat at 5-year interval per guidelines.  Screening for breast cancer - Normal mammogram history.  Continue annual screenings.  Normal breast exam today.  Screening for colon cancer - 07/2020 colonoscopy. Will repeat at GI's recommended interval.   Return in 1 year for annual.    [place], 4:19 PM 01/31/2021

## 2021-02-01 ENCOUNTER — Other Ambulatory Visit: Payer: Self-pay | Admitting: Nurse Practitioner

## 2021-02-01 DIAGNOSIS — Z716 Tobacco abuse counseling: Secondary | ICD-10-CM

## 2021-02-01 DIAGNOSIS — F17209 Nicotine dependence, unspecified, with unspecified nicotine-induced disorders: Secondary | ICD-10-CM

## 2021-02-02 NOTE — Telephone Encounter (Signed)
Refill sent 01/31/21  #60/5 Pharmacy requesting  90 day fill  #180/2

## 2021-02-08 ENCOUNTER — Other Ambulatory Visit: Payer: Self-pay

## 2021-02-08 ENCOUNTER — Ambulatory Visit (INDEPENDENT_AMBULATORY_CARE_PROVIDER_SITE_OTHER): Payer: 59 | Admitting: Otolaryngology

## 2021-02-08 DIAGNOSIS — K219 Gastro-esophageal reflux disease without esophagitis: Secondary | ICD-10-CM

## 2021-02-08 DIAGNOSIS — F458 Other somatoform disorders: Secondary | ICD-10-CM

## 2021-02-08 NOTE — Progress Notes (Signed)
HPI: Nicole Mueller is a 50 y.o. female who presents is referred by her PCP for evaluation of globus type symptoms that she has had for several months.  She has previously had upper GI endoscopy.  She is presently taking pantoprazole for reflux and takes this first thing in the morning.  She feels like something is chronically caught or stuck in her throat on the right side and points to the area of the larynx and cricoid cartilage. She does smoke about a half a pack a day. She has no difficulty swallowing and is gained approximately 10 pounds over the past 6 months. She denies any sore throat..  Past Medical History:  Diagnosis Date  . Chicken pox   . 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 Elevated blood pressure   . Fibroids   . GERD (gastroesophageal reflux disease)   . 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 Hypertension   . Hypolipidemia   . Ovarian cyst   . STD (sexually transmitted disease)    gc over 54yrs ago  . Substance abuse ([place])    hx of - 18 years ago  . UTI (lower urinary tract infection)    Past Surgical History:  Procedure Laterality Date  . FOOT SURGERY Right    bone spur  . WISDOM TOOTH EXTRACTION     Social History   Socioeconomic History  . Marital status: Married    Spouse name: Not on file  . Number of children: 1  . Years of education: Not on file  . Highest education level: Not on file  Occupational History  . Not on file  Tobacco Use  . Smoking status: Current Every Day Smoker    Packs/day: 0.25    Years: 27.00    Pack years: 6.75    Types: Cigarettes  . Smokeless tobacco: Never Used  Vaping Use  . Vaping Use: Never used  Substance and Sexual Activity  . Alcohol use: Yes    Alcohol/week: 0.0 standard drinks    Comment: Rare  . Drug use: No  . Sexual activity: Yes    Partners: Male    Birth control/protection: None    Comment: 1st intercourse  50 yo- More than 5 partners  Other Topics Concern  . Not on file  Social History Narrative  . Not on file   Social Determinants of Health   Financial Resource Strain: Not on file  Food Insecurity: Not on file  Transportation Needs: Not on file  Physical Activity: Not on file  Stress: Not on file  Social Connections: Not on file   Family History  Problem Relation Age of Onset  . Alcoholism Father   . Drug abuse Father   . Prostate cancer Father   . Hypertension Mother   . Diabetes Mother   . Hyperlipidemia Mother   . Heart murmur Mother   . Alcoholism Maternal Grandfather   . Dementia Maternal Grandfather   . Arthritis Maternal Grandmother   . Breast cancer Maternal Grandmother 60  . Stroke Maternal Grandmother   . Hypertension Maternal Grandmother   . Stroke Maternal Aunt   . Colon cancer Maternal Aunt 26  . Hyperlipidemia Maternal Aunt   . Esophageal cancer Maternal Aunt   . Alcoholism Maternal Uncle        x2  . Alcoholism Maternal Aunt   . Aneurysm Maternal Aunt   .  Colon cancer Maternal Aunt   . Cancer Maternal Aunt        Cervical cancer  . Hypertension Maternal Aunt        x2  . Stroke Brother   . Drug abuse Brother   . Heart disease Brother   . Bone cancer Paternal Aunt   . Stomach cancer Neg Hx    No Known Allergies Prior to Admission medications   Medication Sig Start Date End Date Taking? Authorizing Provider  buPROPion (WELLBUTRIN SR) 150 MG 12 hr tablet TAKE 1 TABLET BY MOUTH TWICE A DAY 02/02/21   Nche, Charlene Brooke, NP  cholecalciferol (VITAMIN D3) 25 MCG (1000 UNIT) tablet Take 1,000 Units by mouth daily.    [provider]  nicotine (NICODERM CQ) 14 mg/24hr patch Place 1 patch (14 mg total) onto the skin daily. 01/31/21   Nche, Charlene Brooke, NP  pantoprazole (PROTONIX) 40 MG tablet Take 1 tablet (40 mg total) by mouth daily. 01/19/21   Nche, Charlene Brooke, NP     Positive ROS: Otherwise negative  All other systems have been reviewed  and were otherwise negative with the exception of those mentioned in the HPI and as above.  Physical Exam: Constitutional: Alert, well-appearing, no acute distress Ears: External ears without lesions or tenderness. Ear canals are clear bilaterally with intact, clear TMs.  Nasal: External nose without lesions. Septum midline with mild rhinitis bilaterally and clear mucus discharge.  Both middle meatus regions are clear with no signs of infection.  No polyps noted.. Oral: Lips and gums without lesions. Tongue and palate mucosa without lesions. Posterior oropharynx clear.  Tonsil regions are benign in appearance bilaterally.  Has a mildly elongated uvula. Fiberoptic laryngoscopy through the left nostril revealed a clear nasopharynx.  The base of tongue vallecula and epiglottis were normal.  Vocal cords were clear bilaterally with normal vocal mobility.  Both piriform sinuses were clear.  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  Fiberoptic laryngoscope was passed through the upper esophageal sphincter without difficulty and no mucosal abnormalities were noted. Neck: No palpable adenopathy or masses.  No palpable adenopathy noted on either side of the neck. Respiratory: Breathing comfortably  Skin: No facial/neck lesions or rash noted.  Laryngoscopy  Date/Time: 02/08/2021 2:05 PM Performed by: Rozetta Nunnery, MD Authorized by: Rozetta Nunnery, MD   Consent:    Consent obtained:  Verbal   Consent given by:  Patient Procedure details:    Medication:  Afrin   Instrument: flexible fiberoptic laryngoscope     Scope location: left nare   Sinus:    Left nasopharynx: normal   Mouth:    Oropharynx: normal     Vallecula: normal     Epiglottis: normal   Throat:    Pyriform sinus: normal     True vocal cords: normal   Comments:     On fiberoptic laryngoscopy hypopharynx and larynx was clear to evaluation although she did have moderate edema  of the arytenoid mucosa consistent with probable reflux type symptoms.    Assessment: Globus type symptoms with clear exam on fiberoptic laryngoscopy. Suspect this is related to GE reflux disease.  Plan: Discussed with her concerning taking the pantoprazole before dinner instead of first thing in the morning as this will provide better nighttime coverage of her reflux. Also encouraged her on weight loss as well as stopping smoking   Radene Journey, MD   CC:

## 2021-03-02 ENCOUNTER — Ambulatory Visit: Payer: 59 | Admitting: Nurse Practitioner

## 2021-11-04 ENCOUNTER — Other Ambulatory Visit: Payer: Self-pay

## 2021-11-04 ENCOUNTER — Encounter: Payer: Self-pay | Admitting: Nurse Practitioner

## 2021-11-04 ENCOUNTER — Ambulatory Visit: Payer: 59 | Admitting: Nurse Practitioner

## 2021-11-04 VITALS — BP 140/102 | HR 97 | Temp 97.9°F | Ht 63.0 in | Wt 257.8 lb

## 2021-11-04 DIAGNOSIS — Z720 Tobacco use: Secondary | ICD-10-CM

## 2021-11-04 DIAGNOSIS — G44209 Tension-type headache, unspecified, not intractable: Secondary | ICD-10-CM

## 2021-11-04 DIAGNOSIS — R030 Elevated blood-pressure reading, without diagnosis of hypertension: Secondary | ICD-10-CM

## 2021-11-04 NOTE — Patient Instructions (Signed)
It was great to see you!  Your headaches are most likely tension headaches that can come with increased stress. Your blood pressure is also slightly elevated. Limit the amount of salt you are eating and quit smoking as we discussed. You can take ibuprofen or tylenol as needed for your headache.   Let's follow-up in 1 month, sooner if you have concerns.  If a referral was placed today, you will be contacted for an appointment. Please note that routine referrals can sometimes take up to 3-4 weeks to process. Please call our office if you haven't heard anything after this time frame.  Take care,  Vance Peper, NP

## 2021-11-04 NOTE — Progress Notes (Signed)
Acute Office Visit  Subjective:    Patient ID: Nicole Mueller, female    DOB: 27-Jun-1971, 51 y.o.   MRN: [id number]  Chief Complaint  Patient presents with   Headache    Headaches, some dizziness x 3 days.     HPI Patient is in today for headache for the past 3 days. She states that she normally gets headaches around her menstrual cycle, but this headache is different. She describes it as tightness behind her neck and the back of her head along with some sharp pains off and on around the left side of her head above her ear. She has tried aleve and goody powder and the goody powder has helped her pain.   Chest soreness when she lays down and turns to her side. She states the headache is lingering, but not as bad as before. Sunday was her worst day. She has also increased her water intake. She denies nausea, vomiting, fever, weakness, slurred speech, vision changes, confusion, phonophobia and photophobia, chest pain, and shortness of breath. She does endorses intermittent dizziness that is not associated with changing positions. She states this has been going on for a lot longer than her headache. She checked her blood pressure at home and it was 168/108. She does not have a history of high blood pressure. She also endorses continuing smoking. She was prescribed wellbutrin to help her quit, but she never started it. She is interested in starting wellbutrin in the next few days. She also endorses gaining 40-60 pounds in the last few years.   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[place])    Elevated blood pressure    Fibroids    GERD (gastroesophageal reflux disease)    HPV test positive 01/2015   normal cytology positive high-risk HPV, negative subtype 16, 18/45 recommend repeat Pap smear in one year   Hypertension    Hypolipidemia    Ovarian cyst    STD (sexually transmitted disease)    gc over 22yrs ago   Substance abuse ([place])    hx of - 18  years ago   UTI (lower urinary tract infection)     Past Surgical History:  Procedure Laterality Date   FOOT SURGERY Right    bone spur   WISDOM TOOTH EXTRACTION      Family History  Problem Relation Age of Onset   Alcoholism Father    Drug abuse Father    Prostate cancer Father    Hypertension Mother    Diabetes Mother    Hyperlipidemia Mother    Heart murmur Mother    Alcoholism Maternal Grandfather    Dementia Maternal Grandfather    Arthritis Maternal Grandmother    Breast cancer Maternal Grandmother 47   Stroke Maternal Grandmother    Hypertension Maternal Grandmother    Stroke Maternal Aunt    Colon cancer Maternal Aunt 89   Hyperlipidemia Maternal Aunt    Esophageal cancer Maternal Aunt    Alcoholism Maternal Uncle        x2   Alcoholism Maternal Aunt    Aneurysm Maternal Aunt    Colon cancer Maternal Aunt    Cancer Maternal Aunt        Cervical cancer   Hypertension Maternal Aunt        x2   Stroke Brother    Drug abuse Brother    Heart disease Brother    Bone cancer Paternal Aunt  Stomach cancer Neg Hx     Social History   Socioeconomic History   Marital status: Married    Spouse name: Not on file   Number of children: 1   Years of education: Not on file   Highest education level: Not on file  Occupational History   Not on file  Tobacco Use   Smoking status: Every Day    Packs/day: 0.25    Years: 27.00    Pack years: 6.75    Types: Cigarettes   Smokeless tobacco: Never  Vaping Use   Vaping Use: Never used  Substance and Sexual Activity   Alcohol use: Yes    Alcohol/week: 0.0 standard drinks    Comment: Rare   Drug use: No   Sexual activity: Yes    Partners: Male    Birth control/protection: None    Comment: 1st intercourse 51 yo- More than 5 partners  Other Topics Concern   Not on file  Social History Narrative   Not on file   Social Determinants of Health   Financial Resource Strain: Not on file  Food Insecurity: Not on  file  Transportation Needs: Not on file  Physical Activity: Not on file  Stress: Not on file  Social Connections: Not on file  Intimate Partner Violence: Not on file    Outpatient Medications Prior to Visit  Medication Sig Dispense Refill   cholecalciferol (VITAMIN D3) 25 MCG (1000 UNIT) tablet Take 1,000 Units by mouth daily.     pantoprazole (PROTONIX) 40 MG tablet Take 1 tablet (40 mg total) by mouth daily. 90 tablet 1   buPROPion (WELLBUTRIN SR) 150 MG 12 hr tablet TAKE 1 TABLET BY MOUTH TWICE A DAY (Patient not taking: Reported on 11/04/2021) 180 tablet 2   nicotine (NICODERM CQ) 14 mg/24hr patch Place 1 patch (14 mg total) onto the skin daily. (Patient not taking: Reported on 11/04/2021) 28 patch 0   No facility-administered medications prior to visit.    No Known Allergies  Review of Systems See pertinent positives and negatives per HPI.    Objective:    Physical Exam Vitals and nursing note reviewed.  Constitutional:      General: She is not in acute distress.    Appearance: Normal appearance.  HENT:     Head: Normocephalic.     Right Ear: Tympanic membrane, ear canal and external ear normal.     Left Ear: Tympanic membrane, ear canal and external ear normal.  Eyes:     Extraocular Movements: Extraocular movements intact.     Conjunctiva/sclera: Conjunctivae normal.     Pupils: Pupils are equal, round, and reactive to light.  Cardiovascular:     Rate and Rhythm: Normal rate and regular rhythm.     Pulses: Normal pulses.     Heart sounds: Normal heart sounds.  Pulmonary:     Effort: Pulmonary effort is normal.     Breath sounds: Normal breath sounds.  Musculoskeletal:     Cervical back: Normal range of motion.  Skin:    General: Skin is warm.  Neurological:     General: No focal deficit present.     Mental Status: She is alert and oriented to person, place, and time.     Cranial Nerves: No cranial nerve deficit.     Motor: No weakness.     Gait: Gait normal.      Deep Tendon Reflexes: Reflexes normal.  Psychiatric:        Mood and Affect: Mood  normal.        Behavior: Behavior normal.        Thought Content: Thought content normal.        Judgment: Judgment normal.    BP (!) 140/102 (BP Location: Left Arm, Cuff Size: Normal)    Pulse 97    Temp 97.9 F (36.6 C) (Temporal)    Ht 5\' 3"  (1.6 m)    Wt 257 lb 12.8 oz (116.9 kg)    SpO2 99%    BMI 45.67 kg/m  Wt Readings from Last 3 Encounters:  11/04/21 257 lb 12.8 oz (116.9 kg)  01/31/21 260 lb (117.9 kg)  01/31/21 259 lb 9.6 oz (117.8 kg)    Health Maintenance Due  Topic Date Due   PAP SMEAR-Modifier  12/22/2021    There are no preventive care reminders to display for this patient.   Lab Results  Component Value Date   TSH 2.42 01/19/2021   Lab Results  Component Value Date   WBC 8.0 01/19/2021   HGB 14.2 01/19/2021   HCT 42.6 01/19/2021   MCV 83.4 01/19/2021   PLT 240.0 01/19/2021   Lab Results  Component Value Date   NA 137 01/19/2021   K 4.3 01/19/2021   CO2 28 01/19/2021   GLUCOSE 82 01/19/2021   BUN 8 01/19/2021   CREATININE 0.80 01/19/2021   BILITOT 0.5 01/19/2021   ALKPHOS 112 01/19/2021   AST 16 01/19/2021   ALT 14 01/19/2021   PROT 6.9 01/19/2021   ALBUMIN 4.0 01/19/2021   CALCIUM 9.4 01/19/2021   GFR 87.47 01/19/2021   Lab Results  Component Value Date   CHOL 194 01/19/2021   Lab Results  Component Value Date   HDL 39.20 01/19/2021   Lab Results  Component Value Date   LDLCALC 140 (H) 01/19/2021   Lab Results  Component Value Date   TRIG 70.0 01/19/2021   Lab Results  Component Value Date   CHOLHDL 5 01/19/2021   Lab Results  Component Value Date   HGBA1C 5.8 09/03/2018       Assessment & Plan:   Problem List Items Addressed This Visit       Other   Tension headache    Headache most consistent with tension headache. She endorses increase in stress. She denies depression. It appears she was diagnosed with tension headaches in  2016. She can take ibuprofen and tylenol as needed for pain. Discussed lifestyle changes to help with stress including meditation, listening to music, exercising, and journaling. Blood pressure is also elevated which can be contributing to headache. This is the first time this has been noted. See plan for elevated BP. Follow up in 4 weeks or sooner with worsening symptoms.       Tobacco abuse - Primary    Ongoing cigarette smoking. She was prescribed wellbutrin to help with cessation and stress. She would like to start this and states that she has the medication still. She can start wellbutrin 150mg  daily for 1 week then increase to 150mg  BID. Follow up with PCP in 4 weeks.       Elevated blood pressure reading    BP 140/102 on recheck. Discussed decreasing salt in her diet, smoking cessation, and starting to walk/exercise. Encouraged her to start checking her blood pressure daily at home and writing it down. If blood pressure is still elevated next visit, consider starting medication. Follow up in 4 weeks.        No orders of the defined  types were placed in this encounter.  A total of 30 minutes were spent on this encounter today. When total time is documented, this includes both the face-to-face and non-face-to-face time personally spent before, during and after the visit on the date of the encounter.   Charyl Dancer, NP

## 2021-11-05 DIAGNOSIS — R030 Elevated blood-pressure reading, without diagnosis of hypertension: Secondary | ICD-10-CM

## 2021-11-05 DIAGNOSIS — I10 Essential (primary) hypertension: Secondary | ICD-10-CM | POA: Insufficient documentation

## 2021-11-05 HISTORY — DX: Elevated blood-pressure reading, without diagnosis of hypertension: R03.0

## 2021-11-05 NOTE — Assessment & Plan Note (Signed)
BP 140/102 on recheck. Discussed decreasing salt in her diet, smoking cessation, and starting to walk/exercise. Encouraged her to start checking her blood pressure daily at home and writing it down. If blood pressure is still elevated next visit, consider starting medication. Follow up in 4 weeks.

## 2021-11-05 NOTE — Assessment & Plan Note (Signed)
Headache most consistent with tension headache. She endorses increase in stress. She denies depression. It appears she was diagnosed with tension headaches in 2016. She can take ibuprofen and tylenol as needed for pain. Discussed lifestyle changes to help with stress including meditation, listening to music, exercising, and journaling. Blood pressure is also elevated which can be contributing to headache. This is the first time this has been noted. See plan for elevated BP. Follow up in 4 weeks or sooner with worsening symptoms.

## 2021-11-05 NOTE — Assessment & Plan Note (Addendum)
Ongoing cigarette smoking. She was prescribed wellbutrin to help with cessation and stress. She would like to start this and states that she has the medication still. She can start wellbutrin 150mg  daily for 1 week then increase to 150mg  BID. Follow up with PCP in 4 weeks.

## 2021-11-24 ENCOUNTER — Other Ambulatory Visit: Payer: Self-pay | Admitting: Nurse Practitioner

## 2021-11-24 DIAGNOSIS — F458 Other somatoform disorders: Secondary | ICD-10-CM

## 2021-12-02 ENCOUNTER — Telehealth: Payer: Self-pay | Admitting: Nurse Practitioner

## 2021-12-02 ENCOUNTER — Ambulatory Visit: Payer: 59 | Admitting: Nurse Practitioner

## 2021-12-02 NOTE — Progress Notes (Deleted)
? ?Established Patient Office Visit ? ?Subjective:  ?Patient ID: Nicole Mueller, female    DOB: 1971-08-25  Age: 51 y.o. MRN: [id number] ? ?CC: No chief complaint on file. ? ? ?HPI ?Nicole Mueller presents for follow up on blood pressure.  ? ?Past Medical History:  ?Diagnosis Date  ? Chicken pox   ? Drug abuse in remission [facility])   ? Elevated blood pressure   ? Fibroids   ? GERD (gastroesophageal reflux disease)   ? HPV test positive 01/2015  ? normal cytology positive high-risk HPV, negative subtype 16, 18/45 recommend repeat Pap smear in one year  ? Hypertension   ? Hypolipidemia   ? Ovarian cyst   ? STD (sexually transmitted disease)   ? gc over 72yr ago  ? [place]   ? hx of - 18 years ago  ? UTI (lower urinary tract infection)   ? ? ?Past Surgical History:  ?Procedure Laterality Date  ? FOOT SURGERY Right   ? bone spur  ? WISDOM TOOTH EXTRACTION    ? ? ?Family History  ?Problem Relation Age of Onset  ? Alcoholism Father   ? Drug abuse Father   ? Prostate cancer Father   ? Hypertension Mother   ? Diabetes Mother   ? Hyperlipidemia Mother   ? Heart murmur Mother   ? Alcoholism Maternal Grandfather   ? Dementia Maternal Grandfather   ? Arthritis Maternal Grandmother   ? Breast cancer Maternal Grandmother 60  ? Stroke Maternal Grandmother   ? Hypertension Maternal Grandmother   ? Stroke Maternal Aunt   ? Colon cancer Maternal Aunt 547 ? Hyperlipidemia Maternal Aunt   ? Esophageal cancer Maternal Aunt   ? Alcoholism Maternal Uncle   ?     x2  ? Alcoholism Maternal Aunt   ? Aneurysm Maternal Aunt   ? Colon cancer Maternal Aunt   ? Cancer Maternal Aunt   ?     Cervical cancer  ? Hypertension Maternal Aunt   ?     x2  ? Stroke Brother   ? Drug abuse Brother   ? Heart disease Brother   ? Bone cancer Paternal Aunt   ? Stomach cancer Neg Hx   ? ? ?Social History  ? ?Socioeconomic History  ? Marital status: Married  ?  Spouse name: Not on file  ? Number of children: 1  ? Years of education: Not on file  ?  Highest education level: Not on file  ?Occupational History  ? Not on file  ?Tobacco Use  ? Smoking status: Every Day  ?  Packs/day: 0.25  ?  Years: 27.00  ?  Pack years: 6.75  ?  Types: Cigarettes  ? Smokeless tobacco: Never  ?Vaping Use  ? Vaping Use: Never used  ?Substance and Sexual Activity  ? Alcohol use: Yes  ?  Alcohol/week: 0.0 standard drinks  ?  Comment: Rare  ? Drug use: No  ? Sexual activity: Yes  ?  Partners: Male  ?  Birth control/protection: None  ?  Comment: 1st intercourse 51yo- More than 5 partners  ?Other Topics Concern  ? Not on file  ?Social History Narrative  ? Not on file  ? ?Social Determinants of Health  ? ?Financial Resource Strain: Not on file  ?Food Insecurity: Not on file  ?Transportation Needs: Not on file  ?Physical Activity: Not on file  ?Stress: Not on file  ?Social Connections: Not on file  ?Intimate Partner  Violence: Not on file  ? ? ?Outpatient Medications Prior to Visit  ?Medication Sig Dispense Refill  ? buPROPion (WELLBUTRIN SR) 150 MG 12 hr tablet TAKE 1 TABLET BY MOUTH TWICE A DAY (Patient not taking: Reported on 11/04/2021) 180 tablet 2  ? cholecalciferol (VITAMIN D3) 25 MCG (1000 UNIT) tablet Take 1,000 Units by mouth daily.    ? nicotine (NICODERM CQ) 14 mg/24hr patch Place 1 patch (14 mg total) onto the skin daily. (Patient not taking: Reported on 11/04/2021) 28 patch 0  ? pantoprazole (PROTONIX) 40 MG tablet TAKE 1 TABLET BY MOUTH EVERY DAY 30 tablet 5  ? ?No facility-administered medications prior to visit.  ? ? ?No Known Allergies ? ?ROS ?Review of Systems ? ?  ?Objective:  ?  ?Physical Exam ? ?There were no vitals taken for this visit. ?Wt Readings from Last 3 Encounters:  ?11/04/21 257 lb 12.8 oz (116.9 kg)  ?01/31/21 260 lb (117.9 kg)  ?01/31/21 259 lb 9.6 oz (117.8 kg)  ? ? ? ?Health Maintenance Due  ?Topic Date Due  ? PAP SMEAR-Modifier  12/22/2021  ? ? ?There are no preventive care reminders to display for this patient. ? ?Lab Results  ?Component Value Date  ?  TSH 2.42 01/19/2021  ? ?Lab Results  ?Component Value Date  ? WBC 8.0 01/19/2021  ? HGB 14.2 01/19/2021  ? HCT 42.6 01/19/2021  ? MCV 83.4 01/19/2021  ? PLT 240.0 01/19/2021  ? ?Lab Results  ?Component Value Date  ? NA 137 01/19/2021  ? K 4.3 01/19/2021  ? CO2 28 01/19/2021  ? GLUCOSE 82 01/19/2021  ? BUN 8 01/19/2021  ? CREATININE 0.80 01/19/2021  ? BILITOT 0.5 01/19/2021  ? ALKPHOS 112 01/19/2021  ? AST 16 01/19/2021  ? ALT 14 01/19/2021  ? PROT 6.9 01/19/2021  ? ALBUMIN 4.0 01/19/2021  ? CALCIUM 9.4 01/19/2021  ? GFR 87.47 01/19/2021  ? ?Lab Results  ?Component Value Date  ? CHOL 194 01/19/2021  ? ?Lab Results  ?Component Value Date  ? HDL 39.20 01/19/2021  ? ?Lab Results  ?Component Value Date  ? LDLCALC 140 (H) 01/19/2021  ? ?Lab Results  ?Component Value Date  ? TRIG 70.0 01/19/2021  ? ?Lab Results  ?Component Value Date  ? CHOLHDL 5 01/19/2021  ? ?Lab Results  ?Component Value Date  ? HGBA1C 5.8 09/03/2018  ? ? ?  ?Assessment & Plan:  ? ?Problem List Items Addressed This Visit   ?None ? ? ?No orders of the defined types were placed in this encounter. ? ? ?Follow-up: No follow-ups on file.  ? ? ?Charyl Dancer, NP ?

## 2021-12-02 NOTE — Telephone Encounter (Signed)
Patient/Caregiver was notified of No Show/Late Cancellation Policy & possible $69 charge. ?Visit was cancelled with reason "No Show/Cancel within 24 hours" for tracking & charging. ? ?Caller Name: Nicole Mueller ?Caller Ph #: [phone] ?Date of APPT: 12/02/21 ?Reason given for no show/late cancellation: no reason no show ?No Show Letter printed & put in outgoing mail (Yes/No): yes ? ?~~~Route message to admin supervisor and clinical team/CMA~~~ ? ? ? ?

## 2021-12-14 NOTE — Telephone Encounter (Signed)
1st no show, fee waived, letter sent KO   

## 2022-01-27 ENCOUNTER — Other Ambulatory Visit: Payer: Self-pay | Admitting: Nurse Practitioner

## 2022-01-27 DIAGNOSIS — Z1231 Encounter for screening mammogram for malignant neoplasm of breast: Secondary | ICD-10-CM

## 2022-01-31 ENCOUNTER — Ambulatory Visit
Admission: RE | Admit: 2022-01-31 | Discharge: 2022-01-31 | Disposition: A | Discharge status: Home / Self Care | Payer: 59 | Source: Ambulatory Visit | Attending: Nurse Practitioner | Admitting: Nurse Practitioner

## 2022-01-31 DIAGNOSIS — Z1231 Encounter for screening mammogram for malignant neoplasm of breast: Secondary | ICD-10-CM

## 2022-02-17 ENCOUNTER — Ambulatory Visit (INDEPENDENT_AMBULATORY_CARE_PROVIDER_SITE_OTHER): Payer: 59 | Admitting: Nurse Practitioner

## 2022-02-17 ENCOUNTER — Encounter: Payer: Self-pay | Admitting: Nurse Practitioner

## 2022-02-17 VITALS — BP 122/82 | HR 86 | Temp 96.9°F | Ht 63.0 in | Wt 259.6 lb

## 2022-02-17 DIAGNOSIS — Z13220 Encounter for screening for lipoid disorders: Secondary | ICD-10-CM

## 2022-02-17 DIAGNOSIS — K2100 Gastro-esophageal reflux disease with esophagitis, without bleeding: Secondary | ICD-10-CM | POA: Diagnosis not present

## 2022-02-17 DIAGNOSIS — Z136 Encounter for screening for cardiovascular disorders: Secondary | ICD-10-CM | POA: Diagnosis not present

## 2022-02-17 DIAGNOSIS — Z0001 Encounter for general adult medical examination with abnormal findings: Secondary | ICD-10-CM

## 2022-02-17 DIAGNOSIS — J3089 Other allergic rhinitis: Secondary | ICD-10-CM | POA: Diagnosis not present

## 2022-02-17 LAB — COMPREHENSIVE METABOLIC PANEL
ALT: 15 U/L (ref 0–35)
AST: 17 U/L (ref 0–37)
Albumin: 3.7 g/dL (ref 3.5–5.2)
Alkaline Phosphatase: 102 U/L (ref 39–117)
BUN: 10 mg/dL (ref 6–23)
CO2: 28 mEq/L (ref 19–32)
Calcium: 9.5 mg/dL (ref 8.4–10.5)
Chloride: 103 mEq/L (ref 96–112)
Creatinine, Ser: 0.88 mg/dL (ref 0.40–1.20)
GFR: 77.43 mL/min (ref >60.00)
Glucose, Bld: 81 mg/dL (ref 70–99)
Potassium: 4.1 mEq/L (ref 3.5–5.1)
Sodium: 136 mEq/L (ref 135–145)
Total Bilirubin: 0.5 mg/dL (ref 0.2–1.2)
Total Protein: 7.2 g/dL (ref 6.0–8.3)

## 2022-02-17 LAB — LIPID PANEL
Cholesterol: 186 mg/dL (ref 0–200)
HDL: 43.1 mg/dL (ref >39.00)
LDL Cholesterol: 126 mg/dL — ABNORMAL HIGH (ref 0–99)
NonHDL: 143.33
Total CHOL/HDL Ratio: 4
Triglycerides: 89 mg/dL (ref 0.0–149.0)
VLDL: 17.8 mg/dL (ref 0.0–40.0)

## 2022-02-17 MED ORDER — CETIRIZINE HCL 10 MG PO TABS: 10.0000 mg | ORAL_TABLET | Freq: Every day | ORAL | 0 refills | Status: DC

## 2022-02-17 MED ORDER — FAMOTIDINE 40 MG PO TABS: 40.0000 mg | ORAL_TABLET | Freq: Every day | ORAL | 1 refills | Status: DC

## 2022-02-17 MED ORDER — PANTOPRAZOLE SODIUM 40 MG PO TBEC: 40.0000 mg | DELAYED_RELEASE_TABLET | Freq: Every morning | ORAL | 1 refills | Status: DC

## 2022-02-17 MED ORDER — FLUTICASONE PROPIONATE 50 MCG/ACT NA SUSP: 2.0000 | Freq: Every day | NASAL | 2 refills | Status: DC

## 2022-02-17 NOTE — Assessment & Plan Note (Signed)
Sent zyrtec and flonase Need to stop tobacco use

## 2022-02-17 NOTE — Assessment & Plan Note (Signed)
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 Admits to eating within 2hrs of bedtime, has not made any diet modifications, drinks coffee in AM on empty stomach, daily tobacco use, social consumption of ETOH, take pantoprazole occasionally Has ENT consult: laryngoscopy showed moderate edema of the arytenoid mucosa consistent with probable reflux type symptoms.  Advised about avoid caffeine on empty stomach, avoid food intake within 2hrs of bedtime, provided printed information on GERD diet, need for tobacco cessation. Start pantoprazole in Am and pepcid in PM. F/up as needed

## 2022-02-17 NOTE — Progress Notes (Signed)
Complete physical exam  Patient: Nicole Mueller   DOB: 1971-03-17   51 y.o. Female  MRN: [id number] Visit Date: 02/17/2022  Subjective:    Chief Complaint  Patient presents with   Annual Exam    CPE Pt fasting Concerns of nasal drainage that makes it feel like "something is in her throat" has been going on about a year or more, went to ENT & still hasn't found relief  Pap done today No other concerns   Nicole Mueller is a 51 y.o. female who presents today for a complete physical exam. She reports consuming a general diet.  none  She generally feels well. She reports sleeping well. She does have additional problems to discuss today.  Vision:Within the last year Dental:No STD Screen:No  Wt Readings from Last 3 Encounters:  02/17/22 259 lb 9.6 oz (117.8 kg)  11/04/21 257 lb 12.8 oz (116.9 kg)  01/31/21 260 lb (117.9 kg)    Most recent fall risk assessment:    02/17/2022    8:39 AM  [place] in the past year? 0  Number falls in past yr: 0  Injury with Fall? 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 Admits to eating within 2hrs of bedtime, has not made any diet modifications, drinks coffee in AM on empty stomach, daily tobacco use, social consumption of ETOH, take pantoprazole occasionally Has ENT consult: laryngoscopy showed moderate edema of the arytenoid mucosa consistent with probable reflux type symptoms.  Advised about avoid caffeine on empty stomach, avoid food intake within 2hrs of bedtime, provided printed information on GERD diet, need for tobacco cessation. Start pantoprazole in Am and pepcid in PM. 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   normal cytology positive high-risk HPV, negative subtype 16, 18/45 recommend repeat Pap smear in one year   Hypertension    Hypolipidemia    Ovarian cyst    STD (sexually transmitted disease)    gc over 71yr ago   Substance abuse ([place]    hx of - 18 years ago   UTI (lower urinary tract infection)    Past Surgical History:  Procedure Laterality Date   FOOT SURGERY Right    bone spur   WISDOM TOOTH EXTRACTION     Social History   Socioeconomic History   Marital status: Married    Spouse name: Not on file   Number of children: 1   Years of education: Not on file   Highest education level: Not on file  Occupational History   Not on file  Tobacco Use   Smoking status: Every Day    Packs/day: 0.25    Years: 27.00    Pack years: 6.75    Types: Cigarettes   Smokeless tobacco: Never  Vaping Use   Vaping Use: Never used  Substance and Sexual Activity   Alcohol use: Yes    Alcohol/week: 0.0 standard  drinks    Comment: Rare   Drug use: No   Sexual activity: Yes    Partners: Male    Birth control/protection: None    Comment: 1st intercourse 51 yo- More than 5 partners  Other Topics Concern   Not on file  Social History Narrative   Not on file   Social Determinants of Health   Financial Resource Strain: Not on file  Food Insecurity: Not on file  Transportation Needs: Not on file  Physical Activity: Not on file  Stress: Not on file  Social Connections: Not on file  Intimate Partner Violence: Not on file   Family Status  Relation Name Status   Mother  Alive   Father  Deceased   Mat Aunt  Deceased   Mat Aunt  (Not Specified)   Mat Aunt  (Not Specified)   Mat Uncle  (Not Specified)   Nicole Mueller  (Not Specified)   Nicole Mueller  (Not Specified)   Nicole Mueller  (Not Specified)   Cousin  (Not Specified)   Brother  (Not Specified)   Neg Hx  (Not Specified)   Family  History  Problem Relation Age of Onset   Hypertension Mother    Diabetes Mother    Hyperlipidemia Mother    Heart murmur Mother    Alcoholism Father    Drug abuse Father    Prostate cancer Father    Stroke Maternal Aunt    Colon cancer Maternal Aunt 50   Hyperlipidemia Maternal Aunt    Esophageal cancer Maternal Aunt    Alcoholism Maternal Aunt    Aneurysm Maternal Aunt    Colon cancer Maternal Aunt    Cancer Maternal Aunt        Cervical cancer   Hypertension Maternal Aunt        x2   Alcoholism Maternal Uncle        x2   Bone cancer Paternal Aunt    Arthritis Maternal Grandmother    Breast cancer Maternal Grandmother 60   Stroke Maternal Grandmother    Hypertension Maternal Grandmother    Alcoholism Maternal Grandfather    Dementia Maternal Grandfather    Breast cancer Cousin        48s   Stroke Brother    Drug abuse Brother    Heart disease Brother    Stomach cancer Neg Hx    No Known Allergies  Patient Care Team: Woodward Klem, Charlene Brooke, NP as PCP - General (Internal Medicine)   Medications: Outpatient Medications Prior to Visit  Medication Sig   cholecalciferol (VITAMIN D3) 25 MCG (1000 UNIT) tablet Take 1,000 Units by mouth daily.   [DISCONTINUED] pantoprazole (PROTONIX) 40 MG tablet TAKE 1 TABLET BY MOUTH EVERY DAY   buPROPion (WELLBUTRIN SR) 150 MG 12 hr tablet TAKE 1 TABLET BY MOUTH TWICE A DAY (Patient not taking: Reported on 11/04/2021)   nicotine (NICODERM CQ) 14 mg/24hr patch Place 1 patch (14 mg total) onto the skin daily. (Patient not taking: Reported on 11/04/2021)   No facility-administered medications prior to visit.    Review of Systems  Constitutional:  Negative for fever.  HENT:  Positive for congestion and postnasal drip. Negative for sinus pressure, sinus pain and sore throat.   Eyes:        Negative for visual changes  Respiratory:  Negative for cough and shortness of breath.   Cardiovascular:  Negative for chest pain, palpitations and leg  swelling.  Gastrointestinal:  Positive for nausea. Negative for abdominal distention, abdominal  pain, blood in stool, constipation and diarrhea.  Genitourinary:  Negative for dysuria, frequency and urgency.  Musculoskeletal:  Negative for myalgias.  Skin:  Negative for rash.  Neurological:  Negative for dizziness and headaches.  Hematological:  Does not bruise/bleed easily.  Psychiatric/Behavioral:  Negative for suicidal ideas. The patient is not nervous/anxious.        Objective:  BP 122/82 (BP Location: Right Arm, Patient Position: Sitting, Cuff Size: Normal)   Pulse 86   Temp (!) 96.9 F (36.1 C) (Temporal)   Ht '5\' 3"'$  (1.6 m)   Wt 259 lb 9.6 oz (117.8 kg)   LMP 01/31/2022 (Exact Date)   SpO2 97%   BMI 45.99 kg/m     BP Readings from Last 3 Encounters:  02/17/22 122/82  11/04/21 (!) 140/102  01/31/21 122/76   Wt Readings from Last 3 Encounters:  02/17/22 259 lb 9.6 oz (117.8 kg)  11/04/21 257 lb 12.8 oz (116.9 kg)  01/31/21 260 lb (117.9 kg)   Physical Exam Vitals reviewed.  Constitutional:      General: She is not in acute distress. HENT:     Right Ear: Tympanic membrane, ear canal and external ear normal.     Left Ear: Tympanic membrane, ear canal and external ear normal.  Eyes:     General: No scleral icterus.    Extraocular Movements: Extraocular movements intact.     Conjunctiva/sclera: Conjunctivae normal.  Cardiovascular:     Rate and Rhythm: Normal rate and regular rhythm.     Pulses: Normal pulses.     Heart sounds: Normal heart sounds.  Pulmonary:     Effort: Pulmonary effort is normal. No respiratory distress.     Breath sounds: Normal breath sounds.  Abdominal:     General: Bowel sounds are normal. There is no distension.     Palpations: Abdomen is soft.  Genitourinary:    Comments: Deferred breast and pelvic exam to GYN Musculoskeletal:        General: Normal range of motion.     Cervical back: Normal range of motion and neck supple.      Right lower leg: No edema.     Left lower leg: No edema.  Lymphadenopathy:     Cervical: No cervical adenopathy.  Skin:    General: Skin is warm and dry.  Neurological:     Mental Status: She is alert and oriented to person, place, and time.  Psychiatric:        Mood and Affect: Mood normal.        Behavior: Behavior normal.        Thought Content: Thought content normal.    No results found for any visits on 02/17/22.    Assessment & Plan:    Routine Health Maintenance and Physical Exam  Immunization History  Administered Date(s) Administered   Influenza,inj,Quad PF,6+ Mos 06/04/2013, 07/01/2014, 07/28/2015, 07/26/2016, 08/20/2017, 09/03/2018, 07/05/2021   PFIZER(Purple Top)SARS-COV-2 Vaccination 12/09/2019, 12/23/2019, 09/13/2020   Tdap 07/28/2015   Health Maintenance  Topic Date Due   PAP SMEAR-Modifier  12/22/2021   Hepatitis C Screening  02/18/2023 (Originally 03/12/1989)   INFLUENZA VACCINE  04/18/2022   MAMMOGRAM  02/01/2023   COLONOSCOPY (Pts 45-62yr Insurance coverage will need to be confirmed)  08/10/2023   TETANUS/TDAP  07/27/2025   HIV Screening  Completed   HPV VACCINES  Aged Out   COVID-19 Vaccine  Discontinued   Discussed health benefits of physical activity, and encouraged her to engage in regular exercise appropriate for  her age and condition.  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 Admits to eating within 2hrs of bedtime, has not made any diet modifications, drinks coffee in AM on empty stomach, daily tobacco use, social consumption of ETOH, take pantoprazole occasionally Has ENT consult: laryngoscopy showed moderate edema of the arytenoid mucosa consistent with  probable reflux type symptoms.  Advised about avoid caffeine on empty stomach, avoid food intake within 2hrs of bedtime, provided printed information on GERD diet, need for tobacco cessation. Start pantoprazole in Am and pepcid in PM. F/up as needed       Relevant Medications   pantoprazole (PROTONIX) 40 MG tablet   famotidine (PEPCID) 40 MG tablet     Other   Morbid obesity ([place])   Relevant Orders   Amb Ref to Medical Weight Management   Other Visit Diagnoses     Encounter for preventative adult health care exam with abnormal findings    -  Primary   Relevant Orders   Comprehensive metabolic panel   Lipid panel   Encounter for lipid screening for cardiovascular disease       Relevant Orders   Lipid panel      Return in about 1 year (around 02/18/2023) for CPE (fasting).     Wilfred Lacy, NP

## 2022-02-17 NOTE — Patient Instructions (Signed)
Go to lab  Schedule appt with GYN, dentist, and ophthalmology You will be contacted to schedule appt with weight management clinic. Maintain heart healthy diet and daily exercise. Work on tobacco cessation Limit to 1cup of caffeinated drink per day. Do not drink coffee on an empty stomach.  Preventive Care 72-49 Years Old, Female Preventive care refers to lifestyle choices and visits with your health care provider that can promote health and wellness. Preventive care visits are also called wellness exams. What can I expect for my preventive care visit? Counseling Your health care provider may ask you questions about your: Medical history, including: Past medical problems. Family medical history. Pregnancy history. Current health, including: Menstrual cycle. Method of birth control. Emotional well-being. Home life and relationship well-being. Sexual activity and sexual health. Lifestyle, including: Alcohol, nicotine or tobacco, and drug use. Access to firearms. Diet, exercise, and sleep habits. Work and work Statistician. Sunscreen use. Safety issues such as seatbelt and bike helmet use. Physical exam Your health care provider will check your: Height and weight. These may be used to calculate your BMI (body mass index). BMI is a measurement that tells if you are at a healthy weight. Waist circumference. This measures the distance around your waistline. This measurement also tells if you are at a healthy weight and may help predict your risk of certain diseases, such as type 2 diabetes and high blood pressure. Heart rate and blood pressure. Body temperature. Skin for abnormal spots. What immunizations do I need?  Vaccines are usually given at various ages, according to a schedule. Your health care provider will recommend vaccines for you based on your age, medical history, and lifestyle or other factors, such as travel or where you work. What tests do I need? Screening Your  health care provider may recommend screening tests for certain conditions. This may include: Lipid and cholesterol levels. Diabetes screening. This is done by checking your blood sugar (glucose) after you have not eaten for a while (fasting). Pelvic exam and Pap test. Hepatitis B test. Hepatitis C test. HIV (human immunodeficiency virus) test. STI (sexually transmitted infection) testing, if you are at risk. Lung cancer screening. Colorectal cancer screening. Mammogram. Talk with your health care provider about when you should start having regular mammograms. This may depend on whether you have a family history of breast cancer. BRCA-related cancer screening. This may be done if you have a family history of breast, ovarian, tubal, or peritoneal cancers. Bone density scan. This is done to screen for osteoporosis. Talk with your health care provider about your test results, treatment options, and if necessary, the need for more tests. Follow these instructions at home: Eating and drinking  Eat a diet that includes fresh fruits and vegetables, whole grains, lean protein, and low-fat dairy products. Take vitamin and mineral supplements as recommended by your health care provider. Do not drink alcohol if: Your health care provider tells you not to drink. You are pregnant, may be pregnant, or are planning to become pregnant. If you drink alcohol: Limit how much you have to 0-1 drink a day. Know how much alcohol is in your drink. In the [place] one drink equals one 12 oz bottle of beer (355 mL), one 5 oz glass of wine (148 mL), or one 1 oz glass of hard liquor (44 mL). Lifestyle Brush your teeth every morning and night with fluoride toothpaste. Floss one time each day. Exercise for at least 30 minutes 5 or more days each week. Do not use  any products that contain nicotine or tobacco. These products include cigarettes, chewing tobacco, and vaping devices, such as e-cigarettes. If you need help  quitting, ask your health care provider. Do not use drugs. If you are sexually active, practice safe sex. Use a condom or other form of protection to prevent STIs. If you do not wish to become pregnant, use a form of birth control. If you plan to become pregnant, see your health care provider for a prepregnancy visit. Take aspirin only as told by your health care provider. Make sure that you understand how much to take and what form to take. Work with your health care provider to find out whether it is safe and beneficial for you to take aspirin daily. Find healthy ways to manage stress, such as: Meditation, yoga, or listening to music. Journaling. Talking to a trusted person. Spending time with friends and family. Minimize exposure to UV radiation to reduce your risk of skin cancer. Safety Always wear your seat belt while driving or riding in a vehicle. Do not drive: If you have been drinking alcohol. Do not ride with someone who has been drinking. When you are tired or distracted. While texting. If you have been using any mind-altering substances or drugs. Wear a helmet and other protective equipment during sports activities. If you have firearms in your house, make sure you follow all gun safety procedures. Seek help if you have been physically or sexually abused. What's next? Visit your health care provider once a year for an annual wellness visit. Ask your health care provider how often you should have your eyes and teeth checked. Stay up to date on all vaccines. This information is not intended to replace advice given to you by your health care provider. Make sure you discuss any questions you have with your health care provider. Document Revised: 03/02/2021 Document Reviewed: 03/02/2021 Elsevier Patient Education  [place].

## 2022-02-17 NOTE — Assessment & Plan Note (Signed)
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 Admits to eating within 2hrs of bedtime, has not made any diet modifications, drinks coffee in AM on empty stomach, daily tobacco use, social consumption of ETOH, take pantoprazole occasionally Has ENT consult: laryngoscopy showed moderate edema of the arytenoid mucosa consistent with probable reflux type symptoms.  Advised about avoid caffeine on empty stomach, avoid food intake within 2hrs of bedtime, provided printed information on GERD diet, need for tobacco cessation. Start pantoprazole in Am and pepcid in PM. F/up as needed

## 2022-04-03 NOTE — Progress Notes (Unsigned)
   Nicole Mueller 04/10/1971 [id number]   History:  51 y.o. G3P1021 presents for annual exam. She complains of vaginal itching that comes and goes, mostly near the end of her menses. Denies discharge or odor. Monthly cycles. 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 Normal mammogram history. Smoker - 1/4 ppd. HLD, GERD, managed by PCP.   Gynecologic History No LMP recorded.   Contraception/Family planning: none Sexually active: Yes  Health Maintenance Last Pap: 12/24/2019. Results were: Normal, 3-year repeat Last mammogram:01/31/2022. Results were: Normal Last colonoscopy: 07/2020. Results were: Polyps, 5-year recall Last Dexa: Not indicated  Past medical history, past surgical history, family history and social history were all reviewed and documented in the EPIC chart. Married. Works for [employer] 49 yo daughter in [place].   ROS:  A ROS was performed and pertinent positives and negatives are included.  Exam:  There were no vitals filed for this visit.  There is no height or weight on file to calculate BMI.  General appearance:  Normal Thyroid:  Symmetrical, normal in size, without palpable masses or nodularity. Respiratory  Auscultation:  Clear without wheezing or rhonchi Cardiovascular  Auscultation:  Regular rate, without rubs, murmurs or gallops  Edema/varicosities:  Not grossly evident Abdominal  Soft,nontender, without masses, guarding or rebound.  Liver/spleen:  No organomegaly noted  Hernia:  None appreciated  Skin  Inspection:  Grossly normal Breasts: Examined lying and sitting.   Right: Without masses, retractions, nipple discharge or axillary adenopathy.   Left: Without masses, retractions, nipple discharge or axillary adenopathy. Genitourinary   Inguinal/mons:  Normal without inguinal adenopathy  External genitalia:  Normal appearing vulva with no masses, tenderness, or lesions  BUS/Urethra/Skene's glands:  Normal  Vagina:  Normal appearing with  normal color and discharge, no lesions  Cervix:  Normal appearing without discharge or lesions  Uterus:  Normal in size, shape and contour.  Midline and mobile, nontender  Adnexa/parametria:     Rt: Normal in size, without masses or tenderness.   Lt: Normal in size, without masses or tenderness.  Anus and perineum: Normal  Digital rectal exam: Normal sphincter tone without palpated masses or tenderness  Patient informed chaperone available to be present for breast and pelvic exam. Patient has requested no chaperone to be present. Patient has been advised what will be completed during breast and pelvic exam.   Assessment/Plan:  51 y.o. [id number] for annual exam.   Well female exam with routine gynecological exam - Education provided on SBEs, importance of preventative screenings, current guidelines, high calcium diet, regular exercise, and multivitamin daily. Labs with PCP.   Screening for cervical cancer - 2016 negative cytology positive HPV, subsequent paps normal. Will repeat at 3-year interval per guidelines.  Screening for breast cancer - Normal mammogram history.  Continue annual screenings.  Normal breast exam today.  Screening for colon cancer - 07/2020 colonoscopy. Will repeat at GI's recommended interval.   Return in 1 year for annual.    Tamela Gammon DNP, 2:34 PM 04/03/2022

## 2022-04-04 ENCOUNTER — Ambulatory Visit (INDEPENDENT_AMBULATORY_CARE_PROVIDER_SITE_OTHER): Payer: 59 | Admitting: Nurse Practitioner

## 2022-04-04 ENCOUNTER — Encounter: Payer: Self-pay | Admitting: Nurse Practitioner

## 2022-04-04 VITALS — BP 118/76 | HR 96 | Resp 14 | Ht 61.75 in | Wt 259.0 lb

## 2022-04-04 DIAGNOSIS — L853 Xerosis cutis: Secondary | ICD-10-CM

## 2022-04-04 DIAGNOSIS — Z01419 Encounter for gynecological examination (general) (routine) without abnormal findings: Secondary | ICD-10-CM

## 2022-04-26 ENCOUNTER — Encounter (INDEPENDENT_AMBULATORY_CARE_PROVIDER_SITE_OTHER): Payer: Self-pay

## 2022-05-25 ENCOUNTER — Encounter (INDEPENDENT_AMBULATORY_CARE_PROVIDER_SITE_OTHER): Payer: 59 | Admitting: Family Medicine

## 2022-08-21 ENCOUNTER — Other Ambulatory Visit (INDEPENDENT_AMBULATORY_CARE_PROVIDER_SITE_OTHER): Payer: 59

## 2022-08-21 DIAGNOSIS — Z136 Encounter for screening for cardiovascular disorders: Secondary | ICD-10-CM

## 2022-08-21 DIAGNOSIS — Z13220 Encounter for screening for lipoid disorders: Secondary | ICD-10-CM | POA: Diagnosis not present

## 2022-08-21 LAB — LIPID PANEL
Cholesterol: 182 mg/dL (ref 0–200)
HDL: 40.3 mg/dL (ref >39.00)
LDL Cholesterol: 129 mg/dL — ABNORMAL HIGH (ref 0–99)
NonHDL: 142.11
Total CHOL/HDL Ratio: 5
Triglycerides: 67 mg/dL (ref 0.0–149.0)
VLDL: 13.4 mg/dL (ref 0.0–40.0)

## 2023-01-23 ENCOUNTER — Other Ambulatory Visit: Payer: Self-pay

## 2023-01-23 ENCOUNTER — Encounter (HOSPITAL_BASED_OUTPATIENT_CLINIC_OR_DEPARTMENT_OTHER): Payer: Self-pay | Admitting: Emergency Medicine

## 2023-01-23 ENCOUNTER — Emergency Department (HOSPITAL_BASED_OUTPATIENT_CLINIC_OR_DEPARTMENT_OTHER)
Admission: EM | Admit: 2023-01-23 | Discharge: 2023-01-23 | Disposition: A | Discharge status: Home / Self Care | Payer: 59 | Attending: Emergency Medicine | Admitting: Emergency Medicine

## 2023-01-23 DIAGNOSIS — R1013 Epigastric pain: Secondary | ICD-10-CM | POA: Insufficient documentation

## 2023-01-23 DIAGNOSIS — R000 Tachycardia, unspecified: Secondary | ICD-10-CM | POA: Insufficient documentation

## 2023-01-23 DIAGNOSIS — E860 Dehydration: Secondary | ICD-10-CM | POA: Diagnosis not present

## 2023-01-23 DIAGNOSIS — R112 Nausea with vomiting, unspecified: Secondary | ICD-10-CM

## 2023-01-23 DIAGNOSIS — K219 Gastro-esophageal reflux disease without esophagitis: Secondary | ICD-10-CM | POA: Insufficient documentation

## 2023-01-23 DIAGNOSIS — R0682 Tachypnea, not elsewhere classified: Secondary | ICD-10-CM | POA: Diagnosis not present

## 2023-01-23 DIAGNOSIS — R197 Diarrhea, unspecified: Secondary | ICD-10-CM | POA: Insufficient documentation

## 2023-01-23 LAB — COMPREHENSIVE METABOLIC PANEL
ALT: 19 U/L (ref 0–44)
AST: 25 U/L (ref 15–41)
Albumin: 4.1 g/dL (ref 3.5–5.0)
Alkaline Phosphatase: 116 U/L (ref 38–126)
Anion gap: 13 (ref 5–15)
BUN: 15 mg/dL (ref 6–20)
CO2: 15 mmol/L — ABNORMAL LOW (ref 22–32)
Calcium: 9.9 mg/dL (ref 8.9–10.3)
Chloride: 108 mmol/L (ref 98–111)
Creatinine, Ser: 1.03 mg/dL — ABNORMAL HIGH (ref 0.44–1.00)
GFR, Estimated: >60 mL/min (ref >60)
Glucose, Bld: 150 mg/dL — ABNORMAL HIGH (ref 70–99)
Potassium: 4.3 mmol/L (ref 3.5–5.1)
Sodium: 136 mmol/L (ref 135–145)
Total Bilirubin: 0.6 mg/dL (ref 0.3–1.2)
Total Protein: 8.8 g/dL — ABNORMAL HIGH (ref 6.5–8.1)

## 2023-01-23 LAB — CBC WITH DIFFERENTIAL/PLATELET
Abs Immature Granulocytes: 0.06 10*3/uL (ref 0.00–0.07)
Basophils Absolute: 0 10*3/uL (ref 0.0–0.1)
Basophils Relative: 0 %
Eosinophils Absolute: 0.1 10*3/uL (ref 0.0–0.5)
Eosinophils Relative: 0 %
HCT: 50.7 % — ABNORMAL HIGH (ref 36.0–46.0)
Hemoglobin: 16.9 g/dL — ABNORMAL HIGH (ref 12.0–15.0)
Immature Granulocytes: 0 %
Lymphocytes Relative: 10 %
Lymphs Abs: 1.5 10*3/uL (ref 0.7–4.0)
MCH: 27.2 pg (ref 26.0–34.0)
MCHC: 33.3 g/dL (ref 30.0–36.0)
MCV: 81.6 fL (ref 80.0–100.0)
Monocytes Absolute: 0.4 10*3/uL (ref 0.1–1.0)
Monocytes Relative: 3 %
Neutro Abs: 13.1 10*3/uL — ABNORMAL HIGH (ref 1.7–7.7)
Neutrophils Relative %: 87 %
Platelets: 291 10*3/uL (ref 150–400)
RBC: 6.21 MIL/uL — ABNORMAL HIGH (ref 3.87–5.11)
RDW: 13.2 % (ref 11.5–15.5)
WBC: 15.2 10*3/uL — ABNORMAL HIGH (ref 4.0–10.5)
nRBC: 0 % (ref 0.0–0.2)

## 2023-01-23 LAB — LIPASE, BLOOD: Lipase: 29 U/L (ref 11–51)

## 2023-01-23 LAB — CBG MONITORING, ED: Glucose-Capillary: 119 mg/dL — ABNORMAL HIGH (ref 70–99)

## 2023-01-23 MED ORDER — LACTATED RINGERS IV SOLN: INTRAVENOUS | Status: DC

## 2023-01-23 MED ORDER — METOCLOPRAMIDE HCL 5 MG/ML IJ SOLN
10.0000 mg | Freq: Once | INTRAMUSCULAR | Status: AC
  Administered 2023-01-23: 10 mg via INTRAVENOUS
  Filled 2023-01-23: qty 2

## 2023-01-23 MED ORDER — LACTATED RINGERS IV BOLUS
1000.0000 mL | Freq: Once | INTRAVENOUS | Status: AC
  Administered 2023-01-23: 1000 mL via INTRAVENOUS

## 2023-01-23 MED ORDER — ONDANSETRON HCL 4 MG PO TABS: 4.0000 mg | ORAL_TABLET | Freq: Four times a day (QID) | ORAL | 0 refills | Status: DC

## 2023-01-23 MED ORDER — ONDANSETRON HCL 4 MG/2ML IJ SOLN
4.0000 mg | Freq: Once | INTRAMUSCULAR | Status: AC
  Administered 2023-01-23: 4 mg via INTRAVENOUS
  Filled 2023-01-23: qty 2

## 2023-01-23 NOTE — Discharge Instructions (Signed)
Eat a very bland diet today, continue to take frequent sips to stay hydrated.  If symptoms start again and you start feeling badly like you are earlier return to the emergency room.  If you start getting severe pain in just 1 spot in your stomach or start noticing any blood in your stool return to the emergency room.

## 2023-01-23 NOTE — ED Provider Notes (Signed)
[place] [facility]MENT AT [facility] Provider Note   CSN: [id number] Arrival date & time: 01/23/23  4098     History  Chief Complaint  Patient presents with   Emesis   Diarrhea    Nicole Mueller is a 52 y.o. female.  Patient is a 52 year old female with a history of GERD, hypertension presenting today with sudden onset of nausea vomiting and diarrhea around 3 AM this morning.  Patient has been vomiting every 30 minutes to an hour and has been reports she has been sweating profusely.  She states the stuff she is vomiting now is just very yellow and the stool is water.  Concerned she may have had food poisoning but is not sure what food it may be.  Husband is feeling fine.  She has started no new medications does not have a history of diabetes felt completely fine yesterday when she went to bed.  She has some upper abdominal soreness but denies severe constant pain.  She reports since all the vomiting and diarrhea she is now starting to feel little short of breath.  Some dizziness with standing but no syncope.  No chest pain.  No recent travel outside of the [place] or antibiotics in the last few months.  The history is provided by the patient and the spouse.  Emesis Associated symptoms: diarrhea   Diarrhea Associated symptoms: vomiting        Home Medications Prior to Admission medications   Medication Sig Start Date End Date Taking? Authorizing Provider  ondansetron (ZOFRAN) 4 MG tablet Take 1 tablet (4 mg total) by mouth every 6 (six) hours. 01/23/23  Yes Jahmia Berrett, Alphonzo Lemmings, MD  buPROPion (WELLBUTRIN SR) 150 MG 12 hr tablet TAKE 1 TABLET BY MOUTH TWICE A DAY Patient not taking: Reported on 11/04/2021 02/02/21   Nche, Bonna Gains, NP  cetirizine (ZYRTEC) 10 MG tablet Take 1 tablet (10 mg total) by mouth at bedtime. 02/17/22   Nche, Bonna Gains, NP  cholecalciferol (VITAMIN D3) 25 MCG (1000 UNIT) tablet Take 1,000 Units by mouth daily.    [provider]  famotidine (PEPCID) 40 MG tablet Take 1 tablet (40 mg total) by mouth at bedtime. 02/17/22   Nche, Bonna Gains, NP  fluticasone (FLONASE) 50 MCG/ACT nasal spray Place 2 sprays into both nostrils daily. 02/17/22   Nche, Bonna Gains, NP  nicotine (NICODERM CQ) 14 mg/24hr patch Place 1 patch (14 mg total) onto the skin daily. Patient not taking: Reported on 11/04/2021 01/31/21   Nche, Bonna Gains, NP  pantoprazole (PROTONIX) 40 MG tablet Take 1 tablet (40 mg total) by mouth in the morning. 02/17/22   Nche, Bonna Gains, NP      Allergies    Patient has no known allergies.    Review of Systems   Review of Systems  Gastrointestinal:  Positive for diarrhea and vomiting.    Physical Exam Updated Vital Signs BP 114/83   Pulse (!) 115   Temp 98.9 F (37.2 C)   Resp 17   Wt 117.9 kg   LMP 01/03/2023   SpO2 97%   BMI 47.94 kg/m  Physical Exam Vitals and nursing note reviewed.  Constitutional:      General: She is in acute distress.     Appearance: She is well-developed. She is ill-appearing and diaphoretic.  HENT:     Head: Normocephalic and atraumatic.     Mouth/Throat:     Mouth: Mucous membranes are dry.  Eyes:  Pupils: Pupils are equal, round, and reactive to light.  Cardiovascular:     Rate and Rhythm: Regular rhythm. Tachycardia present.     Heart sounds: Normal heart sounds. No murmur heard.    No friction rub.  Pulmonary:     Effort: Pulmonary effort is normal. Tachypnea present.     Breath sounds: Normal breath sounds. No wheezing or rales.  Abdominal:     General: Bowel sounds are normal. There is no distension.     Palpations: Abdomen is soft.     Tenderness: There is abdominal tenderness in the epigastric area. There is no guarding or rebound.  Musculoskeletal:        General: No tenderness. Normal range of motion.     Right lower leg: No edema.     Left lower leg: No edema.     Comments: No edema  Skin:    General: Skin is warm.      Findings: No rash.  Neurological:     Mental Status: She is alert and oriented to person, place, and time. Mental status is at baseline.     Cranial Nerves: No cranial nerve deficit.  Psychiatric:        Mood and Affect: Mood normal.        Behavior: Behavior normal.     ED Results / Procedures / Treatments   Labs (all labs ordered are listed, but only abnormal results are displayed) Labs Reviewed  CBC WITH DIFFERENTIAL/PLATELET - Abnormal; Notable for the following components:      Result Value   WBC 15.2 (*)    RBC 6.21 (*)    Hemoglobin 16.9 (*)    HCT 50.7 (*)    Neutro Abs 13.1 (*)    All other components within normal limits  COMPREHENSIVE METABOLIC PANEL - Abnormal; Notable for the following components:   CO2 15 (*)    Glucose, Bld 150 (*)    Creatinine, Ser 1.03 (*)    Total Protein 8.8 (*)    All other components within normal limits  CBG MONITORING, ED - Abnormal; Notable for the following components:   Glucose-Capillary 119 (*)    All other components within normal limits  LIPASE, BLOOD  PREGNANCY, URINE    EKG EKG Interpretation  Date/Time:  Tuesday Jan 23 2023 07:56:09 EDT Ventricular Rate:  105 PR Interval:  112 QRS Duration: 92 QT Interval:  355 QTC Calculation: 470 R Axis:   86 Text Interpretation: Sinus tachycardia ST elev, probable normal early repol pattern No significant change since last tracing Confirmed by Gwyneth Sprout ([id number]) on 01/23/2023 12:33:47 PM  Radiology No results found.  Procedures Procedures    Medications Ordered in ED Medications  lactated ringers infusion ( Intravenous New Bag/Given 01/23/23 0918)  ondansetron (ZOFRAN) injection 4 mg (4 mg Intravenous Given 01/23/23 0748)  lactated ringers bolus 1,000 mL (0 mLs Intravenous Stopped 01/23/23 0917)  metoCLOPramide (REGLAN) injection 10 mg (10 mg Intravenous Given 01/23/23 0914)    ED Course/ Medical Decision Making/ A&P                             Medical Decision  Making Amount and/or Complexity of Data Reviewed Labs: ordered. Decision-making details documented in ED Course. ECG/medicine tests: ordered and independent interpretation performed. Decision-making details documented in ED Course.  Risk Prescription drug management.   Pt with multiple medical problems and comorbidities and presenting today with a complaint that caries a  high risk for morbidity and mortality.  Here today with nausea vomiting and diarrhea.  Patient is ill-appearing on arrival with diaphoresis, tachycardia.  Immediately had an episode of diarrhea upon arrival and directly after that patient did have low blood pressure of 76/61 concern for vasovagal event as she is also feeling very nauseated at this time.  Concern for foodborne illness, viral gastroenteritis.  Lower suspicion for AAA, MI, acute lung pathology.  Also possibility for cholecystitis, pancreatitis, hepatitis.  No lower abdominal pain to suggest appendicitis or diverticulitis.  Low suspicion for urinary issue.  Given patient's low blood pressure will ensure normal blood sugar give IV fluid bolus and antiemetics.  12:35 PM I independently interpreted patient's EKG and labs and leukocytosis of 15 today with hemoglobin of 16, blood sugar 119, CMP without acute findings, lipase within normal limits, EKG without acute findings.  Patient feeling much better after IV fluids and antiemetics.  She tolerated p.o.'s.  Patient was able to stand and walk without difficulty.  Initially was tachycardic which improved with IV fluids.  At this time feel that she is stable for discharge home.  Discussed the findings with the patient and her husband.  She was given antiemetic emetics and return precautions.  Abdomen is nontender on recheck.        Final Clinical Impression(s) / ED Diagnoses Final diagnoses:  Nausea vomiting and diarrhea  Dehydration    Rx / DC Orders ED Discharge Orders          Ordered    ondansetron (ZOFRAN) 4  MG tablet  Every 6 hours        01/23/23 1232              Gwyneth Sprout, MD 01/23/23 1235

## 2023-01-23 NOTE — ED Triage Notes (Signed)
Emesis and diarrhea all started this morning , lethargic . Diaphoretic.

## 2023-01-23 NOTE — ED Notes (Signed)
Patient attempted to give a  urine specimen however she missed the catch.

## 2023-02-07 ENCOUNTER — Telehealth: Payer: Self-pay

## 2023-02-07 NOTE — Transitions of Care (Post Inpatient/ED Visit) (Signed)
   02/07/2023  Name: Nicole Mueller MRN: [id number] DOB: September 12, 1971  Today's TOC FU Call Status: Today's TOC FU Call Status:: Successful TOC FU Call Competed TOC FU Call Complete Date: 02/07/23  Transition Care Management Follow-up Telephone Call Date of Discharge: 01/23/23 Discharge Facility: [facility] Type of Discharge: Emergency Department How have you been since you were released from the hospital?: Better  Items Reviewed: Did you receive and understand the discharge instructions provided?: Yes Medications obtained,verified, and reconciled?: Yes (Medications Reviewed) Dietary orders reviewed?: NA Do you have support at home?: Yes  Medications Reviewed Today: Medications Reviewed Today     Reviewed by Olivia Mackie, NP (Nurse Practitioner) on 04/04/22 at 0810  Med List Status: <None>   Medication Order Taking? Sig Documenting Provider Last Dose Status Informant  buPROPion (WELLBUTRIN SR) 150 MG 12 hr tablet [id number] No TAKE 1 TABLET BY MOUTH TWICE A DAY  Patient not taking: Reported on 11/04/2021   Anne Ng, NP Not Taking Active   cetirizine (ZYRTEC) 10 MG tablet [id number] Yes Take 1 tablet (10 mg total) by mouth at bedtime. Nche, Bonna Gains, NP Taking Active   cholecalciferol (VITAMIN D3) 25 MCG (1000 UNIT) tablet [id number] Yes Take 1,000 Units by mouth daily. [provider] Taking Active   famotidine (PEPCID) 40 MG tablet [id number] Yes Take 1 tablet (40 mg total) by mouth at bedtime. Nche, Bonna Gains, NP Taking Active   fluticasone (FLONASE) 50 MCG/ACT nasal spray [id number] Yes Place 2 sprays into both nostrils daily. Nche, Bonna Gains, NP Taking Active   nicotine (NICODERM CQ) 14 mg/24hr patch [id number] No Place 1 patch (14 mg total) onto the skin daily.  Patient not taking: Reported on 11/04/2021   Anne Ng, NP Not Taking Active   pantoprazole (PROTONIX) 40 MG tablet [id number] Yes Take 1 tablet (40 mg total) by mouth  in the morning. Nche, Bonna Gains, NP Taking Active             Home Care and Equipment/Supplies: Were Home Health Services Ordered?: NA Any new equipment or medical supplies ordered?: NA  Functional Questionnaire: Do you need assistance with bathing/showering or dressing?: No Do you need assistance with meal preparation?: No Do you need assistance with eating?: No Do you have difficulty maintaining continence: No Do you need assistance with getting out of bed/getting out of a chair/moving?: No Do you have difficulty managing or taking your medications?: No  Follow up appointments reviewed: PCP Follow-up appointment confirmed?: Yes Date of PCP follow-up appointment?: 02/09/23 Follow-up Provider: [facility] Follow-up appointment confirmed?: NA Do you understand care options if your condition(s) worsen?: Yes-patient verbalized understanding  SDOH Interventions Today    Flowsheet Row Most Recent Value  SDOH Interventions   Food Insecurity Interventions Intervention Not Indicated  Housing Interventions Intervention Not Indicated  Transportation Interventions Intervention Not Indicated  Utilities Interventions Intervention Not Indicated  Alcohol Usage Interventions Intervention Not Indicated (Score <7)  Financial Strain Interventions Intervention Not Indicated  Physical Activity Interventions Patient Declined  Stress Interventions Intervention Not Indicated       SIGNATURE Arvil Persons, BSN, RN

## 2023-02-08 ENCOUNTER — Encounter (HOSPITAL_BASED_OUTPATIENT_CLINIC_OR_DEPARTMENT_OTHER): Payer: Self-pay

## 2023-02-08 ENCOUNTER — Emergency Department (HOSPITAL_BASED_OUTPATIENT_CLINIC_OR_DEPARTMENT_OTHER)
Admission: EM | Admit: 2023-02-08 | Discharge: 2023-02-08 | Disposition: A | Discharge status: Home / Self Care | Payer: 59 | Attending: Emergency Medicine | Admitting: Emergency Medicine

## 2023-02-08 ENCOUNTER — Emergency Department (HOSPITAL_BASED_OUTPATIENT_CLINIC_OR_DEPARTMENT_OTHER): Payer: 59

## 2023-02-08 ENCOUNTER — Telehealth: Payer: Self-pay

## 2023-02-08 ENCOUNTER — Other Ambulatory Visit: Payer: Self-pay

## 2023-02-08 DIAGNOSIS — M549 Dorsalgia, unspecified: Secondary | ICD-10-CM | POA: Insufficient documentation

## 2023-02-08 DIAGNOSIS — R079 Chest pain, unspecified: Secondary | ICD-10-CM | POA: Diagnosis present

## 2023-02-08 LAB — CBC
HCT: 42.2 % (ref 36.0–46.0)
Hemoglobin: 13.6 g/dL (ref 12.0–15.0)
MCH: 27 pg (ref 26.0–34.0)
MCHC: 32.2 g/dL (ref 30.0–36.0)
MCV: 83.7 fL (ref 80.0–100.0)
Platelets: 251 10*3/uL (ref 150–400)
RBC: 5.04 MIL/uL (ref 3.87–5.11)
RDW: 13.5 % (ref 11.5–15.5)
WBC: 9.5 10*3/uL (ref 4.0–10.5)
nRBC: 0 % (ref 0.0–0.2)

## 2023-02-08 LAB — BASIC METABOLIC PANEL
Anion gap: 7 (ref 5–15)
BUN: 10 mg/dL (ref 6–20)
CO2: 23 mmol/L (ref 22–32)
Calcium: 8.6 mg/dL — ABNORMAL LOW (ref 8.9–10.3)
Chloride: 107 mmol/L (ref 98–111)
Creatinine, Ser: 0.87 mg/dL (ref 0.44–1.00)
GFR, Estimated: >60 mL/min (ref >60)
Glucose, Bld: 98 mg/dL (ref 70–99)
Potassium: 3.6 mmol/L (ref 3.5–5.1)
Sodium: 137 mmol/L (ref 135–145)

## 2023-02-08 LAB — TROPONIN I (HIGH SENSITIVITY): Troponin I (High Sensitivity): 2 ng/L (ref <18)

## 2023-02-08 LAB — PREGNANCY, URINE: Preg Test, Ur: NEGATIVE

## 2023-02-08 NOTE — ED Triage Notes (Signed)
States has been having left sided lower back pain, worsening since Saturday. Now having pain in upper back, left arm and chest. Denies shortness of breath.

## 2023-02-08 NOTE — ED Provider Notes (Signed)
[place] [facility] AT [facility] Provider Note   CSN: [id number] Arrival date & time: 02/08/23  1708     History Chief Complaint  Patient presents with   Back Pain   Chest Pain    HPI Nicole Mueller is a 52 y.o. female presenting for a chief complaint of chest pain.  States that she started having left-sided back pain radiating to her left upper chest.  It started last Sunday and last episode was Monday.  Told her PCP about it today recommend she come in for care management.  She denies fevers chills nausea vomiting shortness of breath.  Asymptomatic on arrival here.  Otherwise no symptoms.  No known sick contacts..   Patient's recorded medical, surgical, social, medication list and allergies were reviewed in the Snapshot window as part of the initial history.   Review of Systems   Review of Systems  Constitutional:  Negative for chills and fever.  HENT:  Negative for ear pain and sore throat.   Eyes:  Negative for pain and visual disturbance.  Respiratory:  Negative for cough and shortness of breath.   Cardiovascular:  Positive for chest pain. Negative for palpitations.  Gastrointestinal:  Negative for abdominal pain and vomiting.  Genitourinary:  Negative for dysuria and hematuria.  Musculoskeletal:  Negative for arthralgias and back pain.  Skin:  Negative for color change and rash.  Neurological:  Negative for seizures and syncope.  All other systems reviewed and are negative.   Physical Exam Updated Vital Signs BP (!) 139/99   Pulse 93   Temp 98.1 F (36.7 C) (Oral)   Resp (!) 21   Ht 5\' 3"  (1.6 m)   Wt 115.7 kg   LMP 02/02/2023 (Approximate)   SpO2 99%   BMI 45.17 kg/m  Physical Exam Vitals and nursing note reviewed.  Constitutional:      General: She is not in acute distress.    Appearance: She is well-developed.  HENT:     Head: Normocephalic and atraumatic.  Eyes:     Conjunctiva/sclera: Conjunctivae normal.   Cardiovascular:     Rate and Rhythm: Normal rate and regular rhythm.     Heart sounds: No murmur heard. Pulmonary:     Effort: Pulmonary effort is normal. No respiratory distress.     Breath sounds: Normal breath sounds.  Abdominal:     Palpations: Abdomen is soft.     Tenderness: There is no abdominal tenderness.  Musculoskeletal:        General: No swelling.     Cervical back: Neck supple.  Skin:    General: Skin is warm and dry.     Capillary Refill: Capillary refill takes less than 2 seconds.  Neurological:     Mental Status: She is alert.  Psychiatric:        Mood and Affect: Mood normal.      ED Course/ Medical Decision Making/ A&P Clinical Course as of 02/08/23 2006  Thu Feb 08, 2023  1931 URI on may 7th and then started with chest/back pain since sundary No symptoms atm  Last symptoms 4 hours prior to arrival Does have some stomach discomfort. HLD and Smoking [CC]    Clinical Course User Index [CC] Glyn Ade, MD    Procedures Procedures   Medications Ordered in ED Medications - No data to display  Medical Decision Making: Nicole Mueller is a 52 y.o. female who presented to the ED today with chest pain, detailed above.  Based on patient's comorbidities, patient has a heart score of 3.    Complete initial physical exam performed, notably the patient was HDS .   Reviewed and confirmed nursing documentation for past medical history, family history, social history.    Initial Assessment: With the patient's presentation of left-sided chest pain, most likely diagnosis is musculoskeletal chest pain versus GERD, although ACS remains on the differential. Other diagnoses were considered including (but not limited to) pulmonary embolism, community-acquired pneumonia, aortic dissection, pneumothorax, underlying bony abnormality, anemia. These are considered less likely due to history of present illness and physical exam findings.    In particular, concerning  pulmonary embolism: Patient is PERC negative and the they deny malignancy, recent surgery, history of DVT, or calf tenderness leading to a low risk Wells score. Aortic Dissection also reconsidered but seems less likely based on the location, quality, onset, and severity of symptoms in this case. Patient also has a lack of underlying history of AD or TAA.  This is most consistent with an acute life/limb threatening illness complicated by underlying chronic conditions.   Initial Plan: Evaluate for ACS with delta troponin and EKG evaluated as below  Evaluate for dissection, bony abnormality, or pneumonia with chest x-ray and screening laboratory evaluation including CBC, BMP  Further evaluation for pulmonary embolism not indicated at this time based on patient's PERC and Wells score.  Further evaluation for Thoracic Aortic Dissection not indicated at this time based on patient's clinical history and PE findings.   Initial Study Results: EKG was reviewed independently. Rate, rhythm, axis, intervals all examined and without medically relevant abnormality. ST segments without concerns for elevations.    Laboratory  Single troponin demonstrated no acute pathology CBC and BMP without obvious metabolic or inflammatory abnormalities requiring further evaluation   Radiology  DG Chest 2 View  Result Date: 02/08/2023 CLINICAL DATA:  Chest pain since Saturday. EXAM: CHEST - 2 VIEW COMPARISON:  Chest x-ray dated Feb 10, 2006. FINDINGS: The heart size and mediastinal contours are within normal limits. Both lungs are clear. The visualized skeletal structures are unremarkable. IMPRESSION: No active cardiopulmonary disease. Electronically Signed   By: Obie Dredge M.D.   On: 02/08/2023 17:51    Final Assessment and Plan: Patient's history present on his physical exam findings reveal no acute pathology.  Symptoms remain resolved on repeat assessment after 3 hours in emergency room.  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  Recommend close follow-up with PCP for ongoing outpatient care management.  Disposition:  I have considered need for hospitalization, however, considering all of the above, I believe this patient is stable for discharge at this time.  Patient/family educated about specific return precautions for given chief complaint and symptoms.  Patient/family educated about follow-up with PCP.     Patient/family expressed understanding of return precautions and need for follow-up. Patient spoken to regarding all imaging and laboratory results and appropriate follow up for these results. All education provided in verbal form with additional information in written form. Time was allowed for answering of patient questions. Patient discharged.    Emergency Department Medication Summary:   Medications - No data to display    Clinical Impression:  1. Chest pain, unspecified type      Discharge   Final Clinical Impression(s) / ED Diagnoses Final diagnoses:  Chest pain, unspecified type    Rx / DC Orders ED Discharge Orders     None  Glyn Ade, MD 02/08/23 2006

## 2023-02-08 NOTE — Telephone Encounter (Signed)
Patient was on Friday's schedule for N/V, back pain that radiates to chest and not feeling right.  I was concerned about these symptoms so I called Nurse triage to get more information from the patient.  I spoke with Nofa, RN and we had a conference call with [place].  She asked her questions about her symptoms and after she was finished [place] was advised my Nofa to go to the ED.

## 2023-02-09 ENCOUNTER — Inpatient Hospital Stay: Payer: 59 | Admitting: Nurse Practitioner

## 2023-02-09 ENCOUNTER — Telehealth: Payer: Self-pay

## 2023-02-09 NOTE — Transitions of Care (Post Inpatient/ED Visit) (Signed)
   02/09/2023  Name: Nicole Mueller MRN: [id number] DOB: 06-Mar-1971  Today's TOC FU Call Status: Today's TOC FU Call Status:: Successful TOC FU Call Competed TOC FU Call Complete Date: 02/09/23  Transition Care Management Follow-up Telephone Call Date of Discharge: 02/08/23 Discharge Facility: [facility] Type of Discharge: Emergency Department Reason for ED Visit: Other: (Chest pains) How have you been since you were released from the hospital?: Better Any questions or concerns?: No  Items Reviewed: Did you receive and understand the discharge instructions provided?: Yes Medications obtained,verified, and reconciled?: Yes (Medications Reviewed) Any new allergies since your discharge?: No Dietary orders reviewed?: No Do you have support at home?: No  Medications Reviewed Today: Medications Reviewed Today     Reviewed by Olga Coaster, CMA (Certified Medical Assistant) on 02/09/23 at 1556  Med List Status: <None>   Medication Order Taking? Sig Documenting Provider Last Dose Status Informant  buPROPion (WELLBUTRIN SR) 150 MG 12 hr tablet [id number] No TAKE 1 TABLET BY MOUTH TWICE A DAY  Patient not taking: Reported on 11/04/2021   Nche, Bonna Gains, NP Not Taking Active   cetirizine (ZYRTEC) 10 MG tablet [id number]  Take 1 tablet (10 mg total) by mouth at bedtime. Nche, Bonna Gains, NP  Active   cholecalciferol (VITAMIN D3) 25 MCG (1000 UNIT) tablet [id number]  Take 1,000 Units by mouth daily. [provider]  Active   famotidine (PEPCID) 40 MG tablet [id number] No Take 1 tablet (40 mg total) by mouth at bedtime.  Patient not taking: Reported on 02/07/2023   Anne Ng, NP Not Taking Active   fluticasone (FLONASE) 50 MCG/ACT nasal spray [id number]  Place 2 sprays into both nostrils daily. Nche, Bonna Gains, NP  Active   nicotine (NICODERM CQ) 14 mg/24hr patch [id number] No Place 1 patch (14 mg total) onto the skin daily.  Patient not taking: Reported  on 11/04/2021   Anne Ng, NP Not Taking Active   ondansetron (ZOFRAN) 4 MG tablet [id number]  Take 1 tablet (4 mg total) by mouth every 6 (six) hours. Gwyneth Sprout, MD  Active   pantoprazole (PROTONIX) 40 MG tablet [id number]  Take 1 tablet (40 mg total) by mouth in the morning. Nche, Bonna Gains, NP  Active             Home Care and Equipment/Supplies: Were Home Health Services Ordered?: NA Any new equipment or medical supplies ordered?: NA  Functional Questionnaire: Do you need assistance with bathing/showering or dressing?: No Do you need assistance with meal preparation?: No Do you need assistance with eating?: No Do you have difficulty maintaining continence: No Do you need assistance with getting out of bed/getting out of a chair/moving?: No Do you have difficulty managing or taking your medications?: No  Follow up appointments reviewed: PCP Follow-up appointment confirmed?: Yes Date of PCP follow-up appointment?: 02/15/23 Follow-up Provider: [facility] Follow-up appointment confirmed?: NA Do you need transportation to your follow-up appointment?: No Do you understand care options if your condition(s) worsen?: Yes-patient verbalized understanding    SIGNATURE: Olga Coaster, CMA

## 2023-02-15 ENCOUNTER — Ambulatory Visit: Payer: 59 | Admitting: Nurse Practitioner

## 2023-02-15 ENCOUNTER — Encounter: Payer: Self-pay | Admitting: Nurse Practitioner

## 2023-02-15 VITALS — BP 120/80 | HR 88 | Temp 98.3°F | Resp 16 | Ht 63.0 in | Wt 254.0 lb

## 2023-02-15 DIAGNOSIS — F17209 Nicotine dependence, unspecified, with unspecified nicotine-induced disorders: Secondary | ICD-10-CM

## 2023-02-15 DIAGNOSIS — K2100 Gastro-esophageal reflux disease with esophagitis, without bleeding: Secondary | ICD-10-CM | POA: Diagnosis not present

## 2023-02-15 DIAGNOSIS — R09A2 Foreign body sensation, throat: Secondary | ICD-10-CM

## 2023-02-15 DIAGNOSIS — J3089 Other allergic rhinitis: Secondary | ICD-10-CM | POA: Diagnosis not present

## 2023-02-15 DIAGNOSIS — Z716 Tobacco abuse counseling: Secondary | ICD-10-CM

## 2023-02-15 DIAGNOSIS — Z720 Tobacco use: Secondary | ICD-10-CM

## 2023-02-15 MED ORDER — FLUTICASONE PROPIONATE 50 MCG/ACT NA SUSP
2.0000 | Freq: Every day | NASAL | 2 refills | Status: DC
Start: 2023-02-15 — End: 2024-06-10

## 2023-02-15 MED ORDER — BUPROPION HCL ER (SR) 150 MG PO TB12: 150.0000 mg | ORAL_TABLET | Freq: Two times a day (BID) | ORAL | 5 refills | Status: DC

## 2023-02-15 MED ORDER — PANTOPRAZOLE SODIUM 40 MG PO TBEC
40.0000 mg | DELAYED_RELEASE_TABLET | Freq: Every morning | ORAL | 1 refills | Status: DC
Start: 2023-02-15 — End: 2023-10-16

## 2023-02-15 MED ORDER — FAMOTIDINE 40 MG PO TABS
40.0000 mg | ORAL_TABLET | Freq: Every day | ORAL | 0 refills | Status: DC
Start: 2023-02-15 — End: 2023-10-16

## 2023-02-15 NOTE — Progress Notes (Signed)
Established Patient Visit  Patient: Nicole Mueller   DOB: Feb 05, 1971   52 y.o. Female  MRN: [id number] Visit Date: 02/15/2023  Subjective:    Chief Complaint  Patient presents with   Hospitalization Follow-up    For Chest pains   HPI  She had ED visit on 02/08/23 due to Left side Chest pain radiating to left flank. Sudden onset. Symptoms resolved within a few minutes.  Reviewed CXR and labs completed (normal)   Tobacco abuse Did not start wellbutrin as previously prescribed. Did not use nicoderm pathc either Currently smokes 1/3ppd She wants to resume wellbutrin rx Advised about possible side effects. She verbalized understanding. Wellbutrin rx sent F/up in 53month  Globus sensation Associated with intermittent epigastric pain and nausea in Am minimal improvement with pantoprazole but does not take med daily No weight loss, no dysphagia, no hoarseness, no constipation or diarrhea Laryngoscopy 01/2021: On fiberoptic laryngoscopy hypopharynx and larynx was clear to evaluation although she did have moderate edema of the arytenoid mucosa consistent with probable reflux type symptoms.  Advised to quit tobacco use and need for diet modification Resume pantoprazole in Am and famotidine in PM  Morbid obesity Advised about importance ot heart healthy diet and daily exercise Wt Readings from Last 3 Encounters:  02/15/23 254 lb (115.2 kg)  02/08/23 255 lb (115.7 kg)  01/23/23 260 lb (117.9 kg)      Reviewed medical, surgical, and social history today  Medications: Outpatient Medications Prior to Visit  Medication Sig   cholecalciferol (VITAMIN D3) 25 MCG (1000 UNIT) tablet Take 1,000 Units by mouth daily.   [DISCONTINUED] famotidine (PEPCID) 40 MG tablet Take 1 tablet (40 mg total) by mouth at bedtime.   [DISCONTINUED] fluticasone (FLONASE) 50 MCG/ACT nasal spray Place 2 sprays into both nostrils daily.   [DISCONTINUED] pantoprazole (PROTONIX) 40 MG tablet Take  1 tablet (40 mg total) by mouth in the morning.   [DISCONTINUED] buPROPion (WELLBUTRIN SR) 150 MG 12 hr tablet TAKE 1 TABLET BY MOUTH TWICE A DAY (Patient not taking: Reported on 11/04/2021)   [DISCONTINUED] cetirizine (ZYRTEC) 10 MG tablet Take 1 tablet (10 mg total) by mouth at bedtime. (Patient not taking: Reported on 02/15/2023)   [DISCONTINUED] nicotine (NICODERM CQ) 14 mg/24hr patch Place 1 patch (14 mg total) onto the skin daily. (Patient not taking: Reported on 11/04/2021)   [DISCONTINUED] ondansetron (ZOFRAN) 4 MG tablet Take 1 tablet (4 mg total) by mouth every 6 (six) hours. (Patient not taking: Reported on 02/15/2023)   No facility-administered medications prior to visit.   Reviewed past medical and social history.   ROS per HPI above      Objective:  BP 120/80 (BP Location: Left Arm, Patient Position: Sitting, Cuff Size: Large)   Pulse 88   Temp 98.3 F (36.8 C) (Temporal)   Resp 16   Ht 5\' 3"  (1.6 m)   Wt 254 lb (115.2 kg)   LMP 02/02/2023 (Approximate)   SpO2 97%   BMI 44.99 kg/m      Physical Exam Cardiovascular:     Rate and Rhythm: Normal rate and regular rhythm.     Pulses: Normal pulses.     Heart sounds: Normal heart sounds.  Pulmonary:     Effort: Pulmonary effort is normal.     Breath sounds: Normal breath sounds.  Abdominal:     General: Bowel sounds are normal.     Palpations: Abdomen  is soft.  Musculoskeletal:     Right lower leg: No edema.     Left lower leg: No edema.  Neurological:     Mental Status: She is alert and oriented to person, place, and time.     No results found for any visits on 02/15/23.    Assessment & Plan:    Problem List Items Addressed This Visit       Respiratory   Rhinitis, allergic   Relevant Medications   fluticasone (FLONASE) 50 MCG/ACT nasal spray     Other   Globus sensation    Associated with intermittent epigastric pain and nausea in Am minimal improvement with pantoprazole but does not take med  daily No weight loss, no dysphagia, no hoarseness, no constipation or diarrhea Laryngoscopy 01/2021: On fiberoptic laryngoscopy hypopharynx and larynx was clear to evaluation although she did have moderate edema of the arytenoid mucosa consistent with probable reflux type symptoms.  Advised to quit tobacco use and need for diet modification Resume pantoprazole in Am and famotidine in PM      Morbid obesity (HCC)    Advised about importance ot heart healthy diet and daily exercise Wt Readings from Last 3 Encounters:  02/15/23 254 lb (115.2 kg)  02/08/23 255 lb (115.7 kg)  01/23/23 260 lb (117.9 kg)         Tobacco abuse - Primary    Did not start wellbutrin as previously prescribed. Did not use nicoderm pathc either Currently smokes 1/3ppd She wants to resume wellbutrin rx Advised about possible side effects. She verbalized understanding. Wellbutrin rx sent F/up in 66month      Other Visit Diagnoses     Gastroesophageal reflux disease with esophagitis without hemorrhage       Relevant Medications   famotidine (PEPCID) 40 MG tablet   pantoprazole (PROTONIX) 40 MG tablet   Tobacco use disorder, continuous       Relevant Medications   buPROPion (WELLBUTRIN SR) 150 MG 12 hr tablet   Encounter for smoking cessation counseling       Relevant Medications   buPROPion (WELLBUTRIN SR) 150 MG 12 hr tablet      Return in about 4 weeks (around 03/15/2023) for GERD and tobacco use.     Alysia Penna, NP

## 2023-02-15 NOTE — Assessment & Plan Note (Signed)
Advised about importance ot heart healthy diet and daily exercise Wt Readings from Last 3 Encounters:  02/15/23 254 lb (115.2 kg)  02/08/23 255 lb (115.7 kg)  01/23/23 260 lb (117.9 kg)

## 2023-02-15 NOTE — Assessment & Plan Note (Signed)
Associated with intermittent epigastric pain and nausea in Am minimal improvement with pantoprazole but does not take med daily No weight loss, no dysphagia, no hoarseness, no constipation or diarrhea Laryngoscopy 01/2021: On fiberoptic laryngoscopy hypopharynx and larynx was clear to evaluation although she did have moderate edema of the arytenoid mucosa consistent with probable reflux type symptoms.  Advised to quit tobacco use and need for diet modification Resume pantoprazole in Am and famotidine in PM

## 2023-02-15 NOTE — Assessment & Plan Note (Signed)
Did not start wellbutrin as previously prescribed. Did not use nicoderm pathc either Currently smokes 1/3ppd She wants to resume wellbutrin rx Advised about possible side effects. She verbalized understanding. Wellbutrin rx sent F/up in 17month

## 2023-02-15 NOTE — Patient Instructions (Addendum)
Schedule appt with GYN and breast center for mammogram.  Food Choices for Gastroesophageal Reflux Disease, Adult When you have gastroesophageal reflux disease (GERD), the foods you eat and your eating habits are very important. Choosing the right foods can help ease your discomfort. Think about working with a food expert (dietitian) to help you make good choices. What are tips for following this plan? Reading food labels Look for foods that are low in saturated fat. Foods that may help with your symptoms include: Foods that have less than 5% of daily value (DV) of fat. Foods that have 0 grams of trans fat. Cooking Do not fry your food. Cook your food by baking, steaming, grilling, or broiling. These are all methods that do not need a lot of fat for cooking. To add flavor, try to use herbs that are low in spice and acidity. Meal planning  Choose healthy foods that are low in fat, such as: Fruits and vegetables. Whole grains. Low-fat dairy products. Lean meats, fish, and poultry. Eat small meals often instead of eating 3 large meals each day. Eat your meals slowly in a place where you are relaxed. Avoid bending over or lying down until 2-3 hours after eating. Limit high-fat foods such as fatty meats or fried foods. Limit your intake of fatty foods, such as oils, butter, and shortening. Avoid the following as told by your doctor: Foods that cause symptoms. These may be different for different people. Keep a food diary to keep track of foods that cause symptoms. Alcohol. Drinking a lot of liquid with meals. Eating meals during the 2-3 hours before bed. Lifestyle Stay at a healthy weight. Ask your doctor what weight is healthy for you. If you need to lose weight, work with your doctor to do so safely. Exercise for at least 30 minutes on 5 or more days each week, or as told by your doctor. Wear loose-fitting clothes. Do not smoke or use any products that contain nicotine or tobacco. If you  need help quitting, ask your doctor. Sleep with the head of your bed higher than your feet. Use a wedge under the mattress or blocks under the bed frame to raise the head of the bed. Chew sugar-free gum after meals. What foods should eat?  Eat a healthy, well-balanced diet of fruits, vegetables, whole grains, low-fat dairy products, lean meats, fish, and poultry. Each person is different. Foods that may cause symptoms in one person may not cause any symptoms in another person. Work with your doctor to find foods that are safe for you. The items listed above may not be a complete list of what you can eat and drink. Contact a food expert for more options. What foods should I avoid? Limiting some of these foods may help in managing the symptoms of GERD. Everyone is different. Talk with a food expert or your doctor to help you find the exact foods to avoid, if any. Fruits Any fruits prepared with added fat. Any fruits that cause symptoms. For some people, this may include citrus fruits, such as oranges, grapefruit, pineapple, and lemons. Vegetables Deep-fried vegetables. [place] fries. Any vegetables prepared with added fat. Any vegetables that cause symptoms. For some people, this may include tomatoes and tomato products, chili peppers, onions and garlic, and horseradish. Grains Pastries or quick breads with added fat. Meats and other proteins High-fat meats, such as fatty beef or pork, hot dogs, ribs, ham, sausage, salami, and bacon. Fried meat or protein, including fried fish and fried  chicken. Nuts and nut butters, in large amounts. Dairy Whole milk and chocolate milk. Sour cream. Cream. Ice cream. Cream cheese. Milkshakes. Fats and oils Butter. Margarine. Shortening. Ghee. Beverages Coffee and tea, with or without caffeine. Carbonated beverages. Sodas. Energy drinks. Fruit juice made with acidic fruits, such as orange or grapefruit. Tomato juice. Alcoholic drinks. Sweets and  desserts Chocolate and cocoa. Donuts. Seasonings and condiments Pepper. Peppermint and spearmint. Added salt. Any condiments, herbs, or seasonings that cause symptoms. For some people, this may include curry, hot sauce, or vinegar-based salad dressings. The items listed above may not be a complete list of what you should not eat and drink. Contact a food expert for more options. Questions to ask your doctor Diet and lifestyle changes are often the first steps that are taken to manage symptoms of GERD. If diet and lifestyle changes do not help, talk with your doctor about taking medicines. Where to find more information [employer] for Gastrointestinal Disorders: aboutgerd.org Summary When you have GERD, food and lifestyle choices are very important in easing your symptoms. Eat small meals often instead of 3 large meals a day. Eat your meals slowly and in a place where you are relaxed. Avoid bending over or lying down until 2-3 hours after eating. Limit high-fat foods such as fatty meats or fried foods. This information is not intended to replace advice given to you by your health care provider. Make sure you discuss any questions you have with your health care provider. Document Revised: 03/15/2020 Document Reviewed: 03/15/2020 Elsevier Patient Education  2024 [employer].

## 2023-03-14 ENCOUNTER — Other Ambulatory Visit: Payer: Self-pay | Admitting: Nurse Practitioner

## 2023-03-14 DIAGNOSIS — Z716 Tobacco abuse counseling: Secondary | ICD-10-CM

## 2023-03-14 DIAGNOSIS — F17209 Nicotine dependence, unspecified, with unspecified nicotine-induced disorders: Secondary | ICD-10-CM

## 2023-03-28 ENCOUNTER — Other Ambulatory Visit: Payer: Self-pay

## 2023-04-09 ENCOUNTER — Encounter: Payer: Self-pay | Admitting: Nurse Practitioner

## 2023-04-09 ENCOUNTER — Ambulatory Visit (INDEPENDENT_AMBULATORY_CARE_PROVIDER_SITE_OTHER): Payer: 59 | Admitting: Nurse Practitioner

## 2023-04-09 ENCOUNTER — Other Ambulatory Visit (HOSPITAL_COMMUNITY)
Admission: RE | Admit: 2023-04-09 | Discharge: 2023-04-09 | Disposition: A | Discharge status: Home / Self Care | Payer: 59 | Source: Ambulatory Visit | Attending: Nurse Practitioner | Admitting: Nurse Practitioner

## 2023-04-09 VITALS — BP 112/68 | HR 86 | Ht 63.0 in | Wt 256.0 lb

## 2023-04-09 DIAGNOSIS — Z01419 Encounter for gynecological examination (general) (routine) without abnormal findings: Secondary | ICD-10-CM | POA: Diagnosis not present

## 2023-04-09 DIAGNOSIS — R350 Frequency of micturition: Secondary | ICD-10-CM

## 2023-04-09 DIAGNOSIS — Z124 Encounter for screening for malignant neoplasm of cervix: Secondary | ICD-10-CM | POA: Insufficient documentation

## 2023-04-09 NOTE — Progress Notes (Signed)
   Nicole Mueller 05/12/1971 [id number]   History:  52 y.o. [id number] presents for annual exam. Monthly cycles. 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 Normal mammogram history. Smoker - 1/4 ppd. HLD, GERD, managed by PCP. Complains of intermittent urinary frequency. Denies dysuria, urgency, hematuria, back pain. Has increased water intake but not significantly.   Gynecologic History Patient's last menstrual period was 03/24/2023. Period Cycle (Days): 28 Period Duration (Days): 5 Period Pattern: Regular Menstrual Flow: Moderate Menstrual Control: Thin pad Menstrual Control Change Freq (Hours): 8 Dysmenorrhea: (!) Moderate Dysmenorrhea Symptoms: Cramping, Headache Contraception/Family planning: none Sexually active: Yes  Health Maintenance Last Pap: 12/24/2019. Results were: Normal, 3-year repeat Last mammogram: 01/31/2022. Results were: Normal Last colonoscopy: 08/09/2020. Results were: Polyps, 5-year recall Last Dexa: Not indicated  Past medical history, past surgical history, family history and social history were all reviewed and documented in the EPIC chart. Married. Works for [employer]. 83 yo daughter in [place].   ROS:  A ROS was performed and pertinent positives and negatives are included.  Exam:  Vitals:   04/09/23 0837  BP: 112/68  Pulse: 86  SpO2: 100%  Weight: 256 lb (116.1 kg)  Height: 5\' 3"  (1.6 m)     Body mass index is 45.35 kg/m.  General appearance:  Normal Thyroid:  Symmetrical, normal in size, without palpable masses or nodularity. Respiratory  Auscultation:  Clear without wheezing or rhonchi Cardiovascular  Auscultation:  Regular rate, without rubs, murmurs or gallops  Edema/varicosities:  Not grossly evident Abdominal  Soft,nontender, without masses, guarding or rebound.  Liver/spleen:  No organomegaly noted  Hernia:  None appreciated  Skin  Inspection:  Grossly normal Breasts: Examined lying and sitting.   Right: Without  masses, retractions, nipple discharge or axillary adenopathy.   Left: Without masses, retractions, nipple discharge or axillary adenopathy. Genitourinary   Inguinal/mons:  Normal without inguinal adenopathy. Dryness present  External genitalia:  Normal appearing vulva with no masses, tenderness, or lesions  BUS/Urethra/Skene's glands:  Normal  Vagina:  Normal appearing with normal color and discharge, no lesions  Cervix:  Normal appearing without discharge or lesions  Uterus:  Difficult to palpate due to body habitus but no gross masses or tenderness  Adnexa/parametria:     Rt: Normal in size, without masses or tenderness.   Lt: Normal in size, without masses or tenderness.  Anus and perineum: Normal  Digital rectal exam: Not indicated  Patient informed chaperone available to be present for breast and pelvic exam. Patient has requested no chaperone to be present. Patient has been advised what will be completed during breast and pelvic exam.   Assessment/Plan:  52 y.o. [id number] for annual exam.   Well female exam with routine gynecological exam - Education provided on SBEs, importance of preventative screenings, current guidelines, high calcium diet, regular exercise, and multivitamin daily. Labs with PCP.  Screening for cervical cancer - Plan: Cytology - PAP( [facility]).  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   Urinary frequency - Plan: Urine Culture. Not enough urine for UA. Will send for culture.   Screening for breast cancer - Normal mammogram history.  Overdue and plans to schedule soon. Normal breast exam today.  Screening for colon cancer - 07/2020 colonoscopy. Will repeat at GI's recommended interval.   Return in 1 year for annual.     Olivia Mackie DNP, 9:18 AM 04/09/2023

## 2023-04-10 LAB — CYTOLOGY - PAP
Comment: NEGATIVE
Diagnosis: NEGATIVE
High risk HPV: NEGATIVE

## 2023-04-10 LAB — URINE CULTURE
MICRO NUMBER:: 15229057
SPECIMEN QUALITY:: ADEQUATE

## 2023-05-14 ENCOUNTER — Telehealth: Payer: Self-pay | Admitting: Nurse Practitioner

## 2023-05-14 NOTE — Telephone Encounter (Signed)
Patient dropped off document  phys visit form , to be filled out by provider. Patient requested to send it back via Call Patient to pick up within 5-days. Document is located in providers tray at front office.Please advise at Mobile [phone] (mobile)   Pt dropped off at front desk. Put documents in the dr bx

## 2023-05-14 NOTE — Telephone Encounter (Signed)
 PAPERWORK/FORMS received   CLINICAL USE BELOW THIS LINE (use X to signify action taken)  _X_ Form received and placed in providers office for signature. ___ Form completed and faxed to LOA Dept.  ___ Form completed & LVM to notify patient ready for pick up.  ___ Charge sheet and copy of form in front office folder for office supervisor.

## 2023-06-08 ENCOUNTER — Ambulatory Visit (INDEPENDENT_AMBULATORY_CARE_PROVIDER_SITE_OTHER): Payer: 59 | Admitting: Nurse Practitioner

## 2023-06-08 VITALS — BP 120/82 | HR 84 | Temp 97.6°F | Ht 63.0 in | Wt 254.2 lb

## 2023-06-08 DIAGNOSIS — R7301 Impaired fasting glucose: Secondary | ICD-10-CM

## 2023-06-08 DIAGNOSIS — Z860100 Personal history of colon polyps, unspecified: Secondary | ICD-10-CM

## 2023-06-08 DIAGNOSIS — E7800 Pure hypercholesterolemia, unspecified: Secondary | ICD-10-CM | POA: Diagnosis not present

## 2023-06-08 DIAGNOSIS — R0683 Snoring: Secondary | ICD-10-CM

## 2023-06-08 DIAGNOSIS — R5382 Chronic fatigue, unspecified: Secondary | ICD-10-CM

## 2023-06-08 DIAGNOSIS — Z23 Encounter for immunization: Secondary | ICD-10-CM

## 2023-06-08 DIAGNOSIS — Z1231 Encounter for screening mammogram for malignant neoplasm of breast: Secondary | ICD-10-CM | POA: Diagnosis not present

## 2023-06-08 DIAGNOSIS — Z720 Tobacco use: Secondary | ICD-10-CM | POA: Diagnosis not present

## 2023-06-08 DIAGNOSIS — Z0001 Encounter for general adult medical examination with abnormal findings: Secondary | ICD-10-CM

## 2023-06-08 LAB — CBC
HCT: 46.9 % — ABNORMAL HIGH (ref 36.0–46.0)
Hemoglobin: 14.8 g/dL (ref 12.0–15.0)
MCHC: 31.5 g/dL (ref 30.0–36.0)
MCV: 84.3 fl (ref 78.0–100.0)
Platelets: 301 10*3/uL (ref 150.0–400.0)
RBC: 5.57 Mil/uL — ABNORMAL HIGH (ref 3.87–5.11)
RDW: 14.3 % (ref 11.5–15.5)
WBC: 10.3 10*3/uL (ref 4.0–10.5)

## 2023-06-08 LAB — LIPID PANEL
Cholesterol: 205 mg/dL — ABNORMAL HIGH (ref 0–200)
HDL: 45.6 mg/dL (ref >39.00)
LDL Cholesterol: 139 mg/dL — ABNORMAL HIGH (ref 0–99)
NonHDL: 159.02
Total CHOL/HDL Ratio: 4
Triglycerides: 99 mg/dL (ref 0.0–149.0)
VLDL: 19.8 mg/dL (ref 0.0–40.0)

## 2023-06-08 LAB — HEMOGLOBIN A1C: Hgb A1c MFr Bld: 5.8 % (ref 4.6–6.5)

## 2023-06-08 LAB — TSH: TSH: 2.62 u[IU]/mL (ref 0.35–5.50)

## 2023-06-08 MED ORDER — NICOTINE 7 MG/24HR TD PT24
7.0000 mg | MEDICATED_PATCH | Freq: Every day | TRANSDERMAL | 0 refills | Status: DC
Start: 2023-06-08 — End: 2023-10-16

## 2023-06-08 NOTE — Progress Notes (Signed)
Complete physical exam  Patient: Nicole Mueller   DOB: 12/11/1970   52 y.o. Female  MRN: [id number] Visit Date: 06/08/2023  Subjective:    Chief Complaint  Patient presents with   Annual Exam    Pt stated she dropped off a form to be completed and was advised an appt was needed. Mammogram. Shingles vaccine   DAMA BOGGAN is a 52 y.o. female who presents today for a complete physical exam. She reports consuming a general diet.  No exercise regimen  She generally feels fairly well. She reports sleeping fairly well. She does have additional problems to discuss today.  Vision:Yes Dental:No STD Screen:No  Does not fall alseep watching TV, talking to a person, at stop light Sometimes fall asleep reading No apneic episodes. No AM headache  BP Readings from Last 3 Encounters:  06/08/23 120/82  04/09/23 112/68  02/15/23 120/80   Wt Readings from Last 3 Encounters:  06/08/23 254 lb 3.2 oz (115.3 kg)  04/09/23 256 lb (116.1 kg)  02/15/23 254 lb (115.2 kg)    Most recent fall risk assessment:    06/08/2023    1:19 PM  Fall Risk   Falls in the past year? 0  Number falls in past yr: 0  Injury with Fall? 0     Depression screen:Yes - Depression Most recent depression screenings:    06/08/2023    1:17 PM 02/17/2022    8:51 AM  PHQ 2/9 Scores  PHQ - 2 Score 2 2  PHQ- 9 Score 7 6    HPI  Tobacco abuse Continues to smoke despite use of wellbutrin 150mg  BID. Reports she has Decreased to 3-4cig daily Trigger: stress and anxiety We discussed need for psychology referral and /or use of nicoderm 7mg  patches and/or switch to chantix. She denied need for psychology referral and switch to chantix. She reports wellbutrin helps to stabilize mood. She agreed to use nicoderm patch 7mg  x 64month   Chronic fatigue Persistent and chronic fatigue despite adequate sleep. Also has sore throat and dry mouth in AM Stop-Bang score at 4 (moderate to high score). 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   normal cytology positive high-risk HPV, negative subtype 16, 18/45 recommend repeat Pap smear in one year   Hypertension    Hypolipidemia    Ovarian cyst    STD (sexually transmitted disease)    gc over 51yrs ago   Substance abuse (HCC)    hx of - 18 years ago   UTI (lower urinary tract infection)    Past Surgical History:  Procedure Laterality Date   FOOT SURGERY Right    bone spur   WISDOM TOOTH EXTRACTION     Social History   Socioeconomic History   Marital status: Married    Spouse name: Not on file   Number of children: 1   Years of education: Not on file   Highest education level: Master's degree (e.g., MA, MS, MEng, MEd, MSW, MBA)  Occupational History   Not on file  Tobacco  Use   Smoking status: Every Day    Current packs/day: 0.25    Average packs/day: 0.3 packs/day for 40.4 years (13.5 ttl pk-yrs)    Types: Cigarettes    Passive exposure: Never   Smokeless tobacco: Never   Tobacco comments:    I have a hard time quitting. Throat feels funny, lump.  Vaping Use   Vaping status: Never Used  Substance and Sexual Activity   Alcohol use: Yes    Alcohol/week: 1.0 standard drink of alcohol    Types: 1 Glasses of wine per week    Comment: Really dont drink much at all.   Drug use: No   Sexual activity: Yes    Partners: Male    Birth control/protection: None    Comment: 1st intercourse 52 yo- More than 5 partners  Other Topics Concern   Not on file  Social History Narrative   Not on file   Social Determinants of Health   Financial Resource  Strain: Low Risk  (06/08/2023)   Overall Financial Resource Strain (CARDIA)    Difficulty of Paying Living Expenses: Not very hard  Food Insecurity: Food Insecurity Present (06/08/2023)   Hunger Vital Sign    Worried About Running Out of Food in the Last Year: Sometimes true    Ran Out of Food in the Last Year: Sometimes true  Transportation Needs: No Transportation Needs (06/08/2023)   PRAPARE - Administrator, Civil Service (Medical): No    Lack of Transportation (Non-Medical): No  Physical Activity: Inactive (06/08/2023)   Exercise Vital Sign    Days of Exercise per Week: 0 days    Minutes of Exercise per Session: 0 min  Stress: No Stress Concern Present (06/08/2023)   [employer] of Occupational Health - Occupational Stress Questionnaire    Feeling of Stress : Only a little  Social Connections: Socially Integrated (06/08/2023)   Social Connection and Isolation Panel [NHANES]    Frequency of Communication with Friends and Family: More than three times a week    Frequency of Social Gatherings with Friends and Family: Twice a week    Attends Religious Services: More than 4 times per year    Active Member of [employer] or Organizations: Yes    Attends Banker Meetings: More than 4 times per year    Marital Status: Married  Catering manager Violence: Not At Risk (02/07/2023)   Humiliation, Afraid, Rape, and Kick questionnaire    Fear of Current or Ex-Partner: No    Emotionally Abused: No    Physically Abused: No    Sexually Abused: No   Family Status  Relation Name Status   Mother Cathlyn Parsons   Father  Deceased   Mat Aunt Eulah Citizen and [place] Deceased   Mat Aunt Jenelle Mages (Not Specified)   Mat Aunt Maude Leriche (Not Specified)   Mat Uncle  (Not Specified)   Emelda Brothers  (Not Specified)   MGM Clara H. (Not Specified)   MGF  (Not Specified)   Cousin  (Not Specified)   Brother Lyman Bishop (Not Specified)   Neg Hx  (Not Specified)  No partnership data on file    Family History  Problem Relation Age of Onset   Hypertension Mother    Diabetes Mother    Hyperlipidemia Mother    Heart murmur Mother    Alcoholism Father    Drug abuse Father    Prostate cancer Father    Stroke Maternal Aunt    Colon cancer  Maternal Aunt 50   Hyperlipidemia Maternal Aunt    Esophageal cancer Maternal Aunt    Alcoholism Maternal Aunt    Aneurysm Maternal Aunt    Colon cancer Maternal Aunt    Cancer Maternal Aunt        Cervical cancer   Hypertension Maternal Aunt        x2   Cancer Maternal Aunt    Alcoholism Maternal Uncle        x2   Bone cancer Paternal Aunt    Arthritis Maternal Grandmother    Breast cancer Maternal Grandmother 60   Stroke Maternal Grandmother    Hypertension Maternal Grandmother    Alcoholism Maternal Grandfather    Dementia Maternal Grandfather    Breast cancer Cousin        42s   Stroke Brother    Drug abuse Brother    Heart disease Brother    Stomach cancer Neg Hx    No Known Allergies  Patient Care Team: Herbie Lehrmann, Bonna Gains, NP as PCP - General (Internal Medicine) Olivia Mackie, NP as Nurse Practitioner (Gynecology)   Medications: Outpatient Medications Prior to Visit  Medication Sig   buPROPion (WELLBUTRIN SR) 150 MG 12 hr tablet Take 1 tablet (150 mg total) by mouth 2 (two) times daily.   famotidine (PEPCID) 40 MG tablet Take 1 tablet (40 mg total) by mouth at bedtime.   fluticasone (FLONASE) 50 MCG/ACT nasal spray Place 2 sprays into both nostrils daily.   pantoprazole (PROTONIX) 40 MG tablet Take 1 tablet (40 mg total) by mouth in the morning.   cholecalciferol (VITAMIN D3) 25 MCG (1000 UNIT) tablet Take 1,000 Units by mouth daily. (Patient not taking: Reported on 06/08/2023)   No facility-administered medications prior to visit.   Review of Systems  Constitutional:  Negative for activity change, appetite change and unexpected weight change.  Respiratory: Negative.    Cardiovascular: Negative.    Gastrointestinal: Negative.   Endocrine: Negative for cold intolerance and heat intolerance.  Genitourinary: Negative.   Musculoskeletal: Negative.   Skin: Negative.   Neurological: Negative.   Hematological: Negative.   Psychiatric/Behavioral:  Negative for behavioral problems, decreased concentration, dysphoric mood, hallucinations, self-injury, sleep disturbance and suicidal ideas. The patient is not nervous/anxious.    Last CBC Lab Results  Component Value Date   WBC 9.5 02/08/2023   HGB 13.6 02/08/2023   HCT 42.2 02/08/2023   MCV 83.7 02/08/2023   MCH 27.0 02/08/2023   RDW 13.5 02/08/2023   PLT 251 02/08/2023   Last metabolic panel Lab Results  Component Value Date   GLUCOSE 98 02/08/2023   NA 137 02/08/2023   K 3.6 02/08/2023   CL 107 02/08/2023   CO2 23 02/08/2023   BUN 10 02/08/2023   CREATININE 0.87 02/08/2023   GFRNONAA >60 02/08/2023   CALCIUM 8.6 (L) 02/08/2023   PROT 8.8 (H) 01/23/2023   ALBUMIN 4.1 01/23/2023   BILITOT 0.6 01/23/2023   ALKPHOS 116 01/23/2023   AST 25 01/23/2023   ALT 19 01/23/2023   ANIONGAP 7 02/08/2023   Last lipids Lab Results  Component Value Date   CHOL 182 08/21/2022   HDL 40.30 08/21/2022   LDLCALC 129 (H) 08/21/2022   LDLDIRECT 117 (H) 07/01/2014   TRIG 67.0 08/21/2022   CHOLHDL 5 08/21/2022   Last hemoglobin A1c Lab Results  Component Value Date   HGBA1C 5.8 09/03/2018   Last thyroid functions Lab Results  Component Value Date   TSH 2.42 01/19/2021  T4TOTAL 7.0 02/23/2016   Last vitamin D Lab Results  Component Value Date   VD25OH 21 (L) 05/02/2016      Objective:  BP 120/82 (BP Location: Left Arm, Patient Position: Sitting, Cuff Size: Large)   Pulse 84   Temp 97.6 F (36.4 C) (Temporal)   Ht 5\' 3"  (1.6 m)   Wt 254 lb 3.2 oz (115.3 kg)   LMP 05/16/2023 (Exact Date)   SpO2 99%   BMI 45.03 kg/m     Physical Exam Vitals and nursing note reviewed.  Constitutional:      General: She is not in  acute distress. HENT:     Right Ear: Tympanic membrane, ear canal and external ear normal.     Left Ear: Tympanic membrane, ear canal and external ear normal.     Nose: Nose normal.  Eyes:     Extraocular Movements: Extraocular movements intact.     Conjunctiva/sclera: Conjunctivae normal.     Pupils: Pupils are equal, round, and reactive to light.  Neck:     Thyroid: No thyroid mass, thyromegaly or thyroid tenderness.  Cardiovascular:     Rate and Rhythm: Normal rate and regular rhythm.     Pulses: Normal pulses.     Heart sounds: Normal heart sounds.  Pulmonary:     Effort: Pulmonary effort is normal.     Breath sounds: Normal breath sounds.  Abdominal:     General: Bowel sounds are normal.     Palpations: Abdomen is soft.  Musculoskeletal:        General: Normal range of motion.     Cervical back: Normal range of motion and neck supple.     Right lower leg: No edema.     Left lower leg: No edema.  Lymphadenopathy:     Cervical: No cervical adenopathy.  Skin:    General: Skin is warm and dry.  Neurological:     Mental Status: She is alert and oriented to person, place, and time.     Cranial Nerves: No cranial nerve deficit.  Psychiatric:        Mood and Affect: Mood normal.        Behavior: Behavior normal.        Thought Content: Thought content normal.      Results for orders placed or performed in visit on 06/08/23  HM COLONOSCOPY  Result Value Ref Range   HM Colonoscopy See Report (in chart) See Report (in chart), Patient Reported      Assessment & Plan:    Routine Health Maintenance and Physical Exam  Immunization History  Administered Date(s) Administered   Influenza, Seasonal, Injecte, Preservative Fre 06/08/2023   Influenza,inj,Quad PF,6+ Mos 06/04/2013, 07/01/2014, 07/28/2015, 07/26/2016, 08/20/2017, 09/03/2018, 07/05/2021   PFIZER(Purple Top)SARS-COV-2 Vaccination 12/09/2019, 12/23/2019, 09/13/2020   Tdap 07/28/2015   Zoster Recombinant(Shingrix)  06/08/2023    Health Maintenance  Topic Date Due   Hepatitis C Screening  Never done   MAMMOGRAM  02/01/2023   Colonoscopy  08/10/2023   Zoster Vaccines- Shingrix (2 of 2) 08/03/2023   DTaP/Tdap/Td (2 - Td or Tdap) 07/27/2025   Cervical Cancer Screening (HPV/Pap Cotest)  04/08/2028   INFLUENZA VACCINE  Completed   HIV Screening  Completed   HPV VACCINES  Aged Out   COVID-19 Vaccine  Discontinued    Discussed health benefits of physical activity, and encouraged her to engage in regular exercise appropriate for her age and condition.  Problem List Items Addressed This Visit  Chronic fatigue    Persistent and chronic fatigue despite adequate sleep. Also has sore throat and dry mouth in AM Stop-Bang score at 4 (moderate to high score). Vit D 2022: 55 She agreed to sleep clinic referral Check cbc, hgbA1c and TSH      Relevant Orders   CBC   Iron, TIBC and Ferritin Panel   Ambulatory referral to Sleep Studies   TSH   History of colon polyps    Advised to schedule appointment with GI Last colonoscopy 2021: 2 precancerous polyps removed      Tobacco abuse    Continues to smoke despite use of wellbutrin 150mg  BID. Reports she has Decreased to 3-4cig daily Trigger: stress and anxiety We discussed need for psychology referral and /or use of nicoderm 7mg  patches and/or switch to chantix. She denied need for psychology referral and switch to chantix. She reports wellbutrin helps to stabilize mood. She agreed to use nicoderm patch 7mg  x 59month       Relevant Medications   nicotine (NICODERM CQ) 7 mg/24hr patch   Other Visit Diagnoses     Encounter for preventative adult health care exam with abnormal findings    -  Primary   Immunization due       Relevant Orders   Flu vaccine trivalent PF, 6mos and older(Flulaval,Afluria,Fluarix,Fluzone) (Completed)   Zoster Recombinant (Shingrix ) (Completed)   Impaired fasting blood sugar       Relevant Orders   Hemoglobin A1c    Breast cancer screening by mammogram       Relevant Orders   MM DIGITAL SCREENING BILATERAL   Snoring       Relevant Orders   Ambulatory referral to Sleep Studies   Elevated LDL cholesterol level       Relevant Orders   Lipid panel      Return in about 4 weeks (around 07/06/2023) for tobacco use cessation, depression and anxiety.     Alysia Penna, NP

## 2023-06-08 NOTE — Patient Instructions (Signed)
Schedule appointment for repeat colonoscopy [phone] Return to mammogram on 07/18/2023 Go to lab Form will be completed once I get lab results  Preventive Care 31-50 Years Old, Female Preventive care refers to lifestyle choices and visits with your health care provider that can promote health and wellness. Preventive care visits are also called wellness exams. What can I expect for my preventive care visit? Counseling Your health care provider may ask you questions about your: Medical history, including: Past medical problems. Family medical history. Pregnancy history. Current health, including: Menstrual cycle. Method of birth control. Emotional well-being. Home life and relationship well-being. Sexual activity and sexual health. Lifestyle, including: Alcohol, nicotine or tobacco, and drug use. Access to firearms. Diet, exercise, and sleep habits. Work and work Astronomer. Sunscreen use. Safety issues such as seatbelt and bike helmet use. Physical exam Your health care provider will check your: Height and weight. These may be used to calculate your BMI (body mass index). BMI is a measurement that tells if you are at a healthy weight. Waist circumference. This measures the distance around your waistline. This measurement also tells if you are at a healthy weight and may help predict your risk of certain diseases, such as type 2 diabetes and high blood pressure. Heart rate and blood pressure. Body temperature. Skin for abnormal spots. What immunizations do I need?  Vaccines are usually given at various ages, according to a schedule. Your health care provider will recommend vaccines for you based on your age, medical history, and lifestyle or other factors, such as travel or where you work. What tests do I need? Screening Your health care provider may recommend screening tests for certain conditions. This may include: Lipid and cholesterol levels. Diabetes screening. This  is done by checking your blood sugar (glucose) after you have not eaten for a while (fasting). Pelvic exam and Pap test. Hepatitis B test. Hepatitis C test. HIV (human immunodeficiency virus) test. STI (sexually transmitted infection) testing, if you are at risk. Lung cancer screening. Colorectal cancer screening. Mammogram. Talk with your health care provider about when you should start having regular mammograms. This may depend on whether you have a family history of breast cancer. BRCA-related cancer screening. This may be done if you have a family history of breast, ovarian, tubal, or peritoneal cancers. Bone density scan. This is done to screen for osteoporosis. Talk with your health care provider about your test results, treatment options, and if necessary, the need for more tests. Follow these instructions at home: Eating and drinking  Eat a diet that includes fresh fruits and vegetables, whole grains, lean protein, and low-fat dairy products. Take vitamin and mineral supplements as recommended by your health care provider. Do not drink alcohol if: Your health care provider tells you not to drink. You are pregnant, may be pregnant, or are planning to become pregnant. If you drink alcohol: Limit how much you have to 0-1 drink a day. Know how much alcohol is in your drink. In the [place] one drink equals one 12 oz bottle of beer (355 mL), one 5 oz glass of wine (148 mL), or one 1 oz glass of hard liquor (44 mL). Lifestyle Brush your teeth every morning and night with fluoride toothpaste. Floss one time each day. Exercise for at least 30 minutes 5 or more days each week. Do not use any products that contain nicotine or tobacco. These products include cigarettes, chewing tobacco, and vaping devices, such as e-cigarettes. If you need help quitting,  ask your health care provider. Do not use drugs. If you are sexually active, practice safe sex. Use a condom or other form of protection to  prevent STIs. If you do not wish to become pregnant, use a form of birth control. If you plan to become pregnant, see your health care provider for a prepregnancy visit. Take aspirin only as told by your health care provider. Make sure that you understand how much to take and what form to take. Work with your health care provider to find out whether it is safe and beneficial for you to take aspirin daily. Find healthy ways to manage stress, such as: Meditation, yoga, or listening to music. Journaling. Talking to a trusted person. Spending time with friends and family. Minimize exposure to UV radiation to reduce your risk of skin cancer. Safety Always wear your seat belt while driving or riding in a vehicle. Do not drive: If you have been drinking alcohol. Do not ride with someone who has been drinking. When you are tired or distracted. While texting. If you have been using any mind-altering substances or drugs. Wear a helmet and other protective equipment during sports activities. If you have firearms in your house, make sure you follow all gun safety procedures. Seek help if you have been physically or sexually abused. What's next? Visit your health care provider once a year for an annual wellness visit. Ask your health care provider how often you should have your eyes and teeth checked. Stay up to date on all vaccines. This information is not intended to replace advice given to you by your health care provider. Make sure you discuss any questions you have with your health care provider. Document Revised: 03/02/2021 Document Reviewed: 03/02/2021 Elsevier Patient Education  2024 [employer].

## 2023-06-08 NOTE — Assessment & Plan Note (Addendum)
Persistent and chronic fatigue despite adequate sleep. Also has sore throat and dry mouth in AM Stop-Bang score at 4 (moderate to high score). Vit D 2022: 55 She agreed to sleep clinic referral Check cbc, hgbA1c and TSH

## 2023-06-08 NOTE — Assessment & Plan Note (Addendum)
Continues to smoke despite use of wellbutrin 150mg  BID. Reports she has Decreased to 3-4cig daily Trigger: stress and anxiety We discussed need for psychology referral and /or use of nicoderm 7mg  patches and/or switch to chantix. She denied need for psychology referral and switch to chantix. She reports wellbutrin helps to stabilize mood. She agreed to use nicoderm patch 7mg  x 50month

## 2023-06-08 NOTE — Assessment & Plan Note (Signed)
Advised to schedule appointment with GI Last colonoscopy 2021: 2 precancerous polyps removed

## 2023-06-09 LAB — IRON,TIBC AND FERRITIN PANEL
%SAT: 10 % (calc) — ABNORMAL LOW (ref 16–45)
Ferritin: 41 ng/mL (ref 16–232)
Iron: 46 ug/dL (ref 45–160)
TIBC: 448 mcg/dL (calc) (ref 250–450)

## 2023-06-11 NOTE — Telephone Encounter (Signed)
   CLINICAL USE BELOW THIS LINE (use X to signify action taken)  ___ Form received and placed in providers office for signature. _x__ Form completed and faxed to LOA Dept.  ___ Form completed & LVM to notify patient ready for pick up.  __x_ Charge sheet and copy of form in front office folder for office supervisor.    Patient aware that her copy is placed up front for pick up.

## 2023-06-22 ENCOUNTER — Encounter: Payer: Self-pay | Admitting: Nurse Practitioner

## 2023-07-06 ENCOUNTER — Encounter: Payer: Self-pay | Admitting: Gastroenterology

## 2023-07-06 ENCOUNTER — Ambulatory Visit: Payer: 59 | Admitting: Nurse Practitioner

## 2023-07-06 ENCOUNTER — Other Ambulatory Visit: Payer: Self-pay | Admitting: Nurse Practitioner

## 2023-07-06 DIAGNOSIS — Z720 Tobacco use: Secondary | ICD-10-CM | POA: Diagnosis not present

## 2023-07-06 MED ORDER — SEMAGLUTIDE-WEIGHT MANAGEMENT 0.25 MG/0.5ML ~~LOC~~ SOAJ
0.2500 mg | SUBCUTANEOUS | 0 refills | Status: DC
Start: 2023-07-06 — End: 2023-08-07

## 2023-07-06 NOTE — Telephone Encounter (Signed)
Rx not covered by insurance.

## 2023-07-06 NOTE — Progress Notes (Signed)
Established Patient Visit  Patient: Nicole Mueller   DOB: 02/10/1971   52 y.o. Female  MRN: [id number] Visit Date: 07/06/2023  Subjective:    Chief Complaint  Patient presents with   Follow-up   HPI Tobacco abuse Does not use nicoderm patch daily. Continues to smoke 2cig daily. Encouraged to use nidoderm patch and quit use of cigarettes  Morbid obesity We discussed use of GLP-1 injection for appetite suppression in conjunction with lifestyle modification: exercise and heart healthy diet. Advised about possible med side effects and contraindications. She verbalized understanding and agreed to start wegovy Provided information on diet modification and exercise Wt Readings from Last 3 Encounters:  07/06/23 256 lb 6.4 oz (116.3 kg)  06/08/23 254 lb 3.2 oz (115.3 kg)  04/09/23 256 lb (116.1 kg)    Sent wegovy 0.25mg  weekly F/up in 1week  Reviewed medical, surgical, and social history today  Medications: Outpatient Medications Prior to Visit  Medication Sig   buPROPion (WELLBUTRIN SR) 150 MG 12 hr tablet Take 1 tablet (150 mg total) by mouth 2 (two) times daily.   cholecalciferol (VITAMIN D3) 25 MCG (1000 UNIT) tablet Take 1,000 Units by mouth daily.   famotidine (PEPCID) 40 MG tablet Take 1 tablet (40 mg total) by mouth at bedtime.   fluticasone (FLONASE) 50 MCG/ACT nasal spray Place 2 sprays into both nostrils daily.   nicotine (NICODERM CQ) 7 mg/24hr patch Place 1 patch (7 mg total) onto the skin daily.   pantoprazole (PROTONIX) 40 MG tablet Take 1 tablet (40 mg total) by mouth in the morning.   No facility-administered medications prior to visit.   Reviewed past medical and social history.   ROS per HPI above      Objective:  BP 120/80   Pulse 94   Temp 98.2 F (36.8 C) (Temporal)   Ht 5\' 3"  (1.6 m)   Wt 256 lb 6.4 oz (116.3 kg)   LMP 06/28/2023 (Exact Date)   SpO2 98%   BMI 45.42 kg/m      Physical Exam Vitals and nursing note reviewed.   Cardiovascular:     Rate and Rhythm: Normal rate.     Pulses: Normal pulses.  Pulmonary:     Effort: Pulmonary effort is normal.  Neurological:     Mental Status: She is alert and oriented to person, place, and time.     No results found for any visits on 07/06/23.    Assessment & Plan:    Problem List Items Addressed This Visit     Morbid obesity (HCC) - Primary    We discussed use of GLP-1 injection for appetite suppression in conjunction with lifestyle modification: exercise and heart healthy diet. Advised about possible med side effects and contraindications. She verbalized understanding and agreed to start wegovy Provided information on diet modification and exercise Wt Readings from Last 3 Encounters:  07/06/23 256 lb 6.4 oz (116.3 kg)  06/08/23 254 lb 3.2 oz (115.3 kg)  04/09/23 256 lb (116.1 kg)    Sent wegovy 0.25mg  weekly F/up in 1week      Relevant Medications   Semaglutide-Weight Management 0.25 MG/0.5ML SOAJ   Tobacco abuse    Does not use nicoderm patch daily. Continues to smoke 2cig daily. Encouraged to use nidoderm patch and quit use of cigarettes      Return in about 4 weeks (around 08/03/2023) for Weight management.     Alysia Penna,  NP

## 2023-07-06 NOTE — Telephone Encounter (Signed)
Patient was informed of message and will contact her insurance

## 2023-07-06 NOTE — Patient Instructions (Addendum)
Schedule appointment with GI for repeat colonoscopy: [phone].  Calorie Counting for Weight Loss Calories are units of energy. Your body needs a certain number of calories from food to keep going throughout the day. When you eat or drink more calories than your body needs, your body stores the extra calories mostly as fat. When you eat or drink fewer calories than your body needs, your body burns fat to get the energy it needs. Calorie counting means keeping track of how many calories you eat and drink each day. Calorie counting can be helpful if you need to lose weight. If you eat fewer calories than your body needs, you should lose weight. Ask your health care provider what a healthy weight is for you. For calorie counting to work, you will need to eat the right number of calories each day to lose a healthy amount of weight per week. A dietitian can help you figure out how many calories you need in a day and will suggest ways to reach your calorie goal. A healthy amount of weight to lose each week is usually 1-2 lb (0.5-0.9 kg). This usually means that your daily calorie intake should be reduced by 500-750 calories. Eating 1,200-1,500 calories a day can help most women lose weight. Eating 1,500-1,800 calories a day can help most men lose weight. What do I need to know about calorie counting? Work with your health care provider or dietitian to determine how many calories you should get each day. To meet your daily calorie goal, you will need to: Find out how many calories are in each food that you would like to eat. Try to do this before you eat. Decide how much of the food you plan to eat. Keep a food log. Do this by writing down what you ate and how many calories it had. To successfully lose weight, it is important to balance calorie counting with a healthy lifestyle that includes regular activity. Where do I find calorie information?  The number of calories in a food can be found on a  Nutrition Facts label. If a food does not have a Nutrition Facts label, try to look up the calories online or ask your dietitian for help. Remember that calories are listed per serving. If you choose to have more than one serving of a food, you will have to multiply the calories per serving by the number of servings you plan to eat. For example, the label on a package of bread might say that a serving size is 1 slice and that there are 90 calories in a serving. If you eat 1 slice, you will have eaten 90 calories. If you eat 2 slices, you will have eaten 180 calories. How do I keep a food log? After each time that you eat, record the following in your food log as soon as possible: What you ate. Be sure to include toppings, sauces, and other extras on the food. How much you ate. This can be measured in cups, ounces, or number of items. How many calories were in each food and drink. The total number of calories in the food you ate. Keep your food log near you, such as in a pocket-sized notebook or on an app or website on your mobile phone. Some programs will calculate calories for you and show you how many calories you have left to meet your daily goal. What are some portion-control tips? Know how many calories are in a serving. This will help  you know how many servings you can have of a certain food. Use a measuring cup to measure serving sizes. You could also try weighing out portions on a kitchen scale. With time, you will be able to estimate serving sizes for some foods. Take time to put servings of different foods on your favorite plates or in your favorite bowls and cups so you know what a serving looks like. Try not to eat straight from a food's packaging, such as from a bag or box. Eating straight from the package makes it hard to see how much you are eating and can lead to overeating. Put the amount you would like to eat in a cup or on a plate to make sure you are eating the right portion. Use  smaller plates, glasses, and bowls for smaller portions and to prevent overeating. Try not to multitask. For example, avoid watching TV or using your computer while eating. If it is time to eat, sit down at a table and enjoy your food. This will help you recognize when you are full. It will also help you be more mindful of what and how much you are eating. What are tips for following this plan? Reading food labels Check the calorie count compared with the serving size. The serving size may be smaller than what you are used to eating. Check the source of the calories. Try to choose foods that are high in protein, fiber, and vitamins, and low in saturated fat, trans fat, and sodium. Shopping Read nutrition labels while you shop. This will help you make healthy decisions about which foods to buy. Pay attention to nutrition labels for low-fat or fat-free foods. These foods sometimes have the same number of calories or more calories than the full-fat versions. They also often have added sugar, starch, or salt to make up for flavor that was removed with the fat. Make a grocery list of lower-calorie foods and stick to it. Cooking Try to cook your favorite foods in a healthier way. For example, try baking instead of frying. Use low-fat dairy products. Meal planning Use more fruits and vegetables. One-half of your plate should be fruits and vegetables. Include lean proteins, such as chicken, [place], and fish. Lifestyle Each week, aim to do one of the following: 150 minutes of moderate exercise, such as walking. 75 minutes of vigorous exercise, such as running. General information Know how many calories are in the foods you eat most often. This will help you calculate calorie counts faster. Find a way of tracking calories that works for you. Get creative. Try different apps or programs if writing down calories does not work for you. What foods should I eat?  Eat nutritious foods. It is better to have  a nutritious, high-calorie food, such as an avocado, than a food with few nutrients, such as a bag of potato chips. Use your calories on foods and drinks that will fill you up and will not leave you hungry soon after eating. Examples of foods that fill you up are nuts and nut butters, vegetables, lean proteins, and high-fiber foods such as whole grains. High-fiber foods are foods with more than 5 g of fiber per serving. Pay attention to calories in drinks. Low-calorie drinks include water and unsweetened drinks. The items listed above may not be a complete list of foods and beverages you can eat. Contact a dietitian for more information. What foods should I limit? Limit foods or drinks that are not good sources of vitamins,  minerals, or protein or that are high in unhealthy fats. These include: Candy. Other sweets. Sodas, specialty coffee drinks, alcohol, and juice. The items listed above may not be a complete list of foods and beverages you should avoid. Contact a dietitian for more information. How do I count calories when eating out? Pay attention to portions. Often, portions are much larger when eating out. Try these tips to keep portions smaller: Consider sharing a meal instead of getting your own. If you get your own meal, eat only half of it. Before you start eating, ask for a container and put half of your meal into it. When available, consider ordering smaller portions from the menu instead of full portions. Pay attention to your food and drink choices. Knowing the way food is cooked and what is included with the meal can help you eat fewer calories. If calories are listed on the menu, choose the lower-calorie options. Choose dishes that include vegetables, fruits, whole grains, low-fat dairy products, and lean proteins. Choose items that are boiled, broiled, grilled, or steamed. Avoid items that are buttered, battered, fried, or served with cream sauce. Items labeled as crispy are  usually fried, unless stated otherwise. Choose water, low-fat milk, unsweetened iced tea, or other drinks without added sugar. If you want an alcoholic beverage, choose a lower-calorie option, such as a glass of wine or light beer. Ask for dressings, sauces, and syrups on the side. These are usually high in calories, so you should limit the amount you eat. If you want a salad, choose a garden salad and ask for grilled meats. Avoid extra toppings such as bacon, cheese, or fried items. Ask for the dressing on the side, or ask for olive oil and vinegar or lemon to use as dressing. Estimate how many servings of a food you are given. Knowing serving sizes will help you be aware of how much food you are eating at restaurants. Where to find more information Centers for Disease Control and Prevention: FootballExhibition.com.br U.[place]. Department of Agriculture: WrestlingReporter.dk Summary Calorie counting means keeping track of how many calories you eat and drink each day. If you eat fewer calories than your body needs, you should lose weight. A healthy amount of weight to lose per week is usually 1-2 lb (0.5-0.9 kg). This usually means reducing your daily calorie intake by 500-750 calories. The number of calories in a food can be found on a Nutrition Facts label. If a food does not have a Nutrition Facts label, try to look up the calories online or ask your dietitian for help. Use smaller plates, glasses, and bowls for smaller portions and to prevent overeating. Use your calories on foods and drinks that will fill you up and not leave you hungry shortly after a meal. This information is not intended to replace advice given to you by your health care provider. Make sure you discuss any questions you have with your health care provider. Document Revised: 10/16/2019 Document Reviewed: 10/16/2019 Elsevier Patient Education  2023 [employer]   How to Increase Your Level of Physical Activity Getting regular physical activity is  important for your overall health and well-being. Most people do not get enough exercise. There are easy ways to increase your level of physical activity, even if you have not been very active in the past or if you are just starting out. What are the benefits of physical activity? Physical activity has many short-term and long-term benefits. Being active on a regular basis can improve your  physical and mental health as well as provide other benefits. Physical health benefits Helping you lose weight or maintain a healthy weight. Strengthening your muscles and bones. Reducing your risk of certain long-term (chronic) diseases, including heart disease, cancer, and diabetes. Being able to move around more easily and for longer periods of time without getting tired (increased endurance or stamina). Improving your ability to fight off illness (enhanced immunity). Being able to sleep better. Helping you stay healthy as you get older, including: Helping you stay mobile, or capable of walking and moving around. Preventing accidents, such as falls. Increasing life expectancy. Mental health benefits Boosting your mood and improving your self-esteem. Lowering your chance of having mental health problems, such as depression or anxiety. Helping you feel good about your body. Other benefits Finding new sources of fun and enjoyment. Meeting new people who share a common interest. Before you begin If you have a chronic illness or have not been active for a while, check with your health care provider about how to get started. Ask your health care provider what activities are safe for you. Start out slowly. Walking or doing some simple chair exercises is a good place to start, especially if you have not been active before or for a long time. Set goals that you can work toward. Ask your health care provider how much exercise is best for you. In general, most adults should: Do moderate-intensity exercise for at  least 150 minutes each week (30 minutes on most days of the week) or vigorous exercise for at least 75 minutes each week, or a combination of these. Moderate-intensity exercise can include walking at a quick pace, biking, yoga, water aerobics, or gardening. Vigorous exercise involves activities that take more effort, such as jogging or running, playing sports, swimming laps, or jumping rope. Do strength exercises on at least 2 days each week. This can include weight lifting, body weight exercises, and resistance-band exercises. How to be more physically active Make a plan  Try to find activities that you enjoy. You are more likely to commit to an exercise routine if it does not feel like a chore. If you have bone or joint problems, choose low-impact exercises, like walking or swimming. Use these tips for being successful with an exercise plan: Find a workout partner for accountability. Join a group or class, such as an aerobics class, cycling class, or sports team. Make family time active. Go for a walk, bike, or swim. Include a variety of exercises each week. Consider using a fitness tracker, such as a mobile phone app or a device worn like a watch, that will count the number of steps you take each day. Many people strive to reach 10,000 steps a day. Find ways to be active in your daily routines Besides your formal exercise plans, you can find ways to do physical activity during your daily routines, such as: Walking or biking to work or to the store. Taking the stairs instead of the elevator. Parking farther away from the door at work or at the store. Planning walking meetings. Walking around while you are on the phone. Where to find more information Centers for Disease Control and Prevention: CampusCasting.com.pt [employer] Council on Fitness, Sports & Nutrition: www.fitness.gov ChooseMyPlate: http://www.harvey.com/ Contact a health care provider if: You have headaches, muscle aches,  or joint pain that is concerning. You feel dizzy or light-headed while exercising. You faint. You feel your heart skipping, racing, or fluttering. You have chest pain while exercising. Summary Exercise  benefits your mind and body at any age, even if you are just starting out. If you have a chronic illness or have not been active for a while, check with your health care provider before increasing your physical activity. Choose activities that are safe and enjoyable for you. Ask your health care provider what activities are safe for you. Start slowly. Tell your health care provider if you have problems as you start to increase your activity level. This information is not intended to replace advice given to you by your health care provider. Make sure you discuss any questions you have with your health care provider. Document Revised: 12/31/2020 Document Reviewed: 12/31/2020 Elsevier Patient Education  2024 [employer].

## 2023-07-06 NOTE — Assessment & Plan Note (Signed)
We discussed use of GLP-1 injection for appetite suppression in conjunction with lifestyle modification: exercise and heart healthy diet. Advised about possible med side effects and contraindications. She verbalized understanding and agreed to start wegovy Provided information on diet modification and exercise Wt Readings from Last 3 Encounters:  07/06/23 256 lb 6.4 oz (116.3 kg)  06/08/23 254 lb 3.2 oz (115.3 kg)  04/09/23 256 lb (116.1 kg)    Sent wegovy 0.25mg  weekly F/up in 1week

## 2023-07-06 NOTE — Assessment & Plan Note (Signed)
Does not use nicoderm patch daily. Continues to smoke 2cig daily. Encouraged to use nidoderm patch and quit use of cigarettes

## 2023-07-17 ENCOUNTER — Encounter: Payer: Self-pay | Admitting: Neurology

## 2023-07-17 ENCOUNTER — Ambulatory Visit: Payer: 59 | Admitting: Neurology

## 2023-07-17 VITALS — BP 150/90 | HR 80 | Ht 63.0 in | Wt 256.0 lb

## 2023-07-17 DIAGNOSIS — R519 Headache, unspecified: Secondary | ICD-10-CM

## 2023-07-17 DIAGNOSIS — R0683 Snoring: Secondary | ICD-10-CM | POA: Diagnosis not present

## 2023-07-17 DIAGNOSIS — Z9189 Other specified personal risk factors, not elsewhere classified: Secondary | ICD-10-CM | POA: Diagnosis not present

## 2023-07-17 DIAGNOSIS — R5383 Other fatigue: Secondary | ICD-10-CM | POA: Diagnosis not present

## 2023-07-17 DIAGNOSIS — Z820 Family history of epilepsy and other diseases of the nervous system: Secondary | ICD-10-CM

## 2023-07-17 DIAGNOSIS — Z6841 Body Mass Index (BMI) 40.0 and over, adult: Secondary | ICD-10-CM

## 2023-07-17 NOTE — Patient Instructions (Signed)

## 2023-07-17 NOTE — Progress Notes (Signed)
Subjective:    Patient ID: Nicole Mueller is a 52 y.o. female.  HPI    Huston Foley, MD, PhD [facility] [address] [address] [place], [place] [address]  Dear Claris Gower,  I saw your patient, Nicole Mueller, upon your kind request in my sleep clinic today for initial consultation of her sleep disorder, in particular, concern for underlying obstructive sleep apnea.  The patient is unaccompanied today.  As you know, Nicole Mueller is a 52 year old female with an underlying medical history of reflux disease, hypertension, substance use disorder (per chart review, in remission), smoking, and severe obesity with a BMI of over 45, who reports snoring and daytime tiredness.  Snoring can be loud per husband's report.  Her Epworth sleepiness score is 4 out of 24, fatigue severity score is 25 out of 63.  I reviewed your office note from 06/08/2023. She had blood work through your office at the time, and I reviewed the results: TSH was normal, hematocrit mildly elevated at 46.9, iron saturation below normal at 10%, ferritin level 41, lipid panel showed elevated LDL at 139, A1c 5.8.  Her bedtime is generally between 9 and 10:30 PM and rise time around 5:30 AM.  He denies nightly nocturia but has had occasional morning headaches.  She works for the [employer] of [employer], site.  She lives with her husband and her 46 year old daughter.  She drinks caffeine in the form of soda, 1 or 2 cans/day, occasional iced tea, coffee, 1 cup in the morning.  She quit smoking for a while but picked it back up, she currently smokes about 3 to 4 cigarettes/day.  She drinks alcohol rarely if any.  She has not used any illicit drugs in years, probably around 17 years.  She has some cousins with sleep apnea.  They have no pets in the household.  She does have a TV on in her bedroom at night, her husband typically turns it off and she has a tendency to stay on her phone for extended periods of time at  night before falling asleep.  She is working on weight loss.  Her Past Medical History Is Significant For: 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HCC)    Elevated blood pressure    Elevated blood pressure reading 11/05/2021   Fibroids    GERD (gastroesophageal reflux disease)    HPV test positive 01/2015   normal cytology positive high-risk HPV, negative subtype 16, 18/45 recommend repeat Pap smear in one year   Hypertension    Hypolipidemia    Ovarian cyst    STD (sexually transmitted disease)    gc over 19yrs ago   Substance abuse (HCC)    hx of - 18 years ago   UTI (lower urinary tract infection)     Her Past Surgical History Is Significant For: Past Surgical History:  Procedure Laterality Date   FOOT SURGERY Right    bone spur   WISDOM TOOTH EXTRACTION      Her Family History Is Significant For: Family History  Problem Relation Age of Onset   Hypertension Mother    Diabetes Mother    Hyperlipidemia Mother    Heart murmur Mother    Alcoholism Father    Drug abuse Father    Prostate cancer Father    Stroke Brother    Drug abuse Brother    Heart disease Brother    Stroke Maternal Aunt  Colon cancer Maternal Aunt 50   Hyperlipidemia Maternal Aunt    Esophageal cancer Maternal Aunt    Alcoholism Maternal Aunt    Aneurysm Maternal Aunt    Colon cancer Maternal Aunt    Cancer Maternal Aunt        Cervical cancer   Hypertension Maternal Aunt        x2   Cancer Maternal Aunt    Alcoholism Maternal Uncle        x2   Bone cancer Paternal Aunt    Arthritis Maternal Grandmother    Breast cancer Maternal Grandmother 60   Stroke Maternal Grandmother    Hypertension Maternal Grandmother    Alcoholism Maternal Grandfather    Dementia Maternal Grandfather    Breast cancer Cousin        38s   Stomach cancer Neg Hx    Sleep apnea Neg Hx     Her Social History Is Significant For: Social History   Socioeconomic History    Marital status: Married    Spouse name: Not on file   Number of children: 1   Years of education: Not on file   Highest education level: Master's degree (e.g., MA, MS, MEng, MEd, MSW, MBA)  Occupational History   Not on file  Tobacco Use   Smoking status: Every Day    Current packs/day: 0.25    Average packs/day: 0.3 packs/day for 40.4 years (13.5 ttl pk-yrs)    Types: Cigarettes    Passive exposure: Never   Smokeless tobacco: Never   Tobacco comments:    Cut down to Smokes 2cig daily  Vaping Use   Vaping status: Never Used  Substance and Sexual Activity   Alcohol use: Yes    Alcohol/week: 1.0 standard drink of alcohol    Types: 1 Glasses of wine per week    Comment: Really dont drink much at all.   Drug use: No   Sexual activity: Yes    Partners: Male    Birth control/protection: None    Comment: 1st intercourse 52 yo- More than 5 partners  Other Topics Concern   Not on file  Social History Narrative   Not on file   Social Determinants of Health   Financial Resource Strain: Low Risk  (07/06/2023)   Overall Financial Resource Strain (CARDIA)    Difficulty of Paying Living Expenses: Not very hard  Food Insecurity: No Food Insecurity (07/06/2023)   Hunger Vital Sign    Worried About Running Out of Food in the Last Year: Never true    Ran Out of Food in the Last Year: Never true  Recent Concern: Food Insecurity - Food Insecurity Present (06/08/2023)   Hunger Vital Sign    Worried About Running Out of Food in the Last Year: Sometimes true    Ran Out of Food in the Last Year: Sometimes true  Transportation Needs: No Transportation Needs (07/06/2023)   PRAPARE - Administrator, Civil Service (Medical): No    Lack of Transportation (Non-Medical): No  Physical Activity: Inactive (07/06/2023)   Exercise Vital Sign    Days of Exercise per Week: 0 days    Minutes of Exercise per Session: 0 min  Stress: No Stress Concern Present (07/06/2023)   [employer]  of Occupational Health - Occupational Stress Questionnaire    Feeling of Stress : Not at all  Social Connections: Socially Integrated (07/06/2023)   Social Connection and Isolation Panel [NHANES]    Frequency of Communication with Friends  and Family: More than three times a week    Frequency of Social Gatherings with Friends and Family: Once a week    Attends Religious Services: More than 4 times per year    Active Member of [employer] or Organizations: Yes    Attends Engineer, structural: More than 4 times per year    Marital Status: Married    Her Allergies Are:  No Known Allergies:   Her Current Medications Are:  Outpatient Encounter Medications as of 07/17/2023  Medication Sig   buPROPion (WELLBUTRIN SR) 150 MG 12 hr tablet Take 1 tablet (150 mg total) by mouth 2 (two) times daily.   cholecalciferol (VITAMIN D3) 25 MCG (1000 UNIT) tablet Take 1,000 Units by mouth daily.   famotidine (PEPCID) 40 MG tablet Take 1 tablet (40 mg total) by mouth at bedtime.   fluticasone (FLONASE) 50 MCG/ACT nasal spray Place 2 sprays into both nostrils daily.   nicotine (NICODERM CQ) 7 mg/24hr patch Place 1 patch (7 mg total) onto the skin daily.   pantoprazole (PROTONIX) 40 MG tablet Take 1 tablet (40 mg total) by mouth in the morning.   Semaglutide-Weight Management 0.25 MG/0.5ML SOAJ Inject 0.25 mg into the skin once a week.   No facility-administered encounter medications on file as of 07/17/2023.  :   Review of Systems:  Out of a complete 14 point review of systems, all are reviewed and negative with the exception of these symptoms as listed below:  Review of Systems  Neurological:        Pt here for sleep consult Pt snores,fatigue,headaches Pt denies sleep study,cpap machine,hypertension    ESS4 FSS:25     Objective:  Neurological Exam  Physical Exam Physical Examination:   Vitals:   07/17/23 0910  BP: (!) 150/90  Pulse: 80    General Examination: The patient is a very  pleasant 52 y.o. female in no acute distress. She appears well-developed and well-nourished and well groomed.   HEENT: Normocephalic, atraumatic, pupils are equal, round and reactive to light, extraocular tracking is good without limitation to gaze excursion or nystagmus noted. Hearing is grossly intact. Face is symmetric with normal facial animation. Speech is clear with no dysarthria noted. There is no hypophonia. There is no lip, neck/head, jaw or voice tremor. Neck is supple with full range of passive and active motion. There are no carotid bruits on auscultation. Oropharynx exam reveals: mild mouth dryness, adequate dental hygiene and moderate airway crowding, due to tonsils of 1+ and slightly elongated uvula, which ends in a wisp-like ending. Mallampati is class III. Tongue protrudes centrally and palate elevates symmetrically. Neck size is 16.5 inches. She has no significant overbite, mildly skewed teeth alignment.   Chest: Clear to auscultation without wheezing, rhonchi or crackles noted.  Heart: S1+S2+0, regular and normal without murmurs, rubs or gallops noted.   Abdomen: Soft, non-tender and non-distended.  Extremities: There is no pitting edema in the distal lower extremities bilaterally.   Skin: Warm and dry without trophic changes noted.   Musculoskeletal: exam reveals no obvious joint deformities.   Neurologically:  Mental status: The patient is awake, alert and oriented in all 4 spheres. Her immediate and remote memory, attention, language skills and fund of knowledge are appropriate. There is no evidence of aphasia, agnosia, apraxia or anomia. Speech is clear with normal prosody and enunciation. Thought process is linear. Mood is normal and affect is normal.  Cranial nerves II - XII are as described above under HEENT  exam.  Motor exam: Normal bulk, strength and tone is noted. There is no obvious action or resting tremor.  Fine motor skills and coordination: grossly intact.   Cerebellar testing: No dysmetria or intention tremor. There is no truncal or gait ataxia.  Sensory exam: intact to light touch in the upper and lower extremities.  Gait, station and balance: She stands easily. No veering to one side is noted. No leaning to one side is noted. Posture is age-appropriate and stance is narrow based. Gait shows normal stride length and normal pace. No problems turning are noted.   Assessment and Plan:  In summary, Nicole Mueller is a very pleasant 52 y.o.-year old female with an underlying medical history of reflux disease, hypertension, substance use disorder (per chart review, in remission), smoking, and severe obesity with a BMI of over 45, whose history and physical exam are concerning for sleep disordered breathing, particularly obstructive sleep apnea (OSA).  While a laboratory attended sleep study is typically considered "gold standard" for evaluation of sleep disordered breathing, we mutually agreed to proceed with a home sleep test at this time.   I had a long chat with the patient about my findings and the diagnosis of sleep apnea, particularly OSA, its prognosis and treatment options. We talked about medical/conservative treatments, surgical interventions and non-pharmacological approaches for symptom control. I explained, in particular, the risks and ramifications of untreated moderate to severe OSA, especially with respect to developing cardiovascular disease down the road, including congestive heart failure (CHF), difficult to treat hypertension, cardiac arrhythmias (particularly A-fib), neurovascular complications including TIA, stroke and dementia. Even type 2 diabetes has, in part, been linked to untreated OSA. Symptoms of untreated OSA may include (but may not be limited to) daytime sleepiness, nocturia (i.e. frequent nighttime urination), memory problems, mood irritability and suboptimally controlled or worsening mood disorder such as depression and/or  anxiety, lack of energy, lack of motivation, physical discomfort, as well as recurrent headaches, especially morning or nocturnal headaches. We talked about the importance of maintaining a healthy lifestyle and striving for healthy weight.  The importance of complete smoking cessation was also addressed.  In addition, we talked about the importance of striving for and maintaining good sleep hygiene. I recommended a sleep study at this time. I outlined the differences between a laboratory attended sleep study which is considered more comprehensive and accurate over the option of a home sleep test (HST); the latter may lead to underestimation of sleep disordered breathing in some instances and does not help with diagnosing upper airway resistance syndrome and is not accurate enough to diagnose primary central sleep apnea typically. I outlined possible surgical and non-surgical treatment options of OSA, including the use of a positive airway pressure (PAP) device (i.e. CPAP, AutoPAP/APAP or BiPAP in certain circumstances), a custom-made dental device (aka oral appliance, which would require a referral to a specialist dentist or orthodontist typically, and is generally speaking not considered for patients with full dentures or edentulous state), upper airway surgical options, such as traditional UPPP (which is not considered a first-line treatment) or the Inspire device (hypoglossal nerve stimulator, which would involve a referral for consultation with an ENT surgeon, after careful selection, following inclusion criteria - also not first-line treatment). I explained the PAP treatment option to the patient in detail, as this is generally considered first-line treatment.  The patient indicated that she would be willing to try PAP therapy, if the need arises. I explained the importance of being compliant with PAP  treatment, not only for insurance purposes but primarily to improve patient's symptoms symptoms, and for the  patient's long term health benefit, including to reduce Her cardiovascular risks longer-term.    We will pick up our discussion about the next steps and treatment options after testing.  We will keep her posted as to the test results by phone call and/or MyChart messaging where possible.  We will plan to follow-up in sleep clinic accordingly as well.  I answered all her questions today and the patient was in agreement.   I encouraged her to call with any interim questions, concerns, problems or updates or email [place] through MyChart.  Generally speaking, sleep test authorizations may take up to 2 weeks, sometimes less, sometimes longer, the patient is encouraged to get in touch with [place] if they do not hear back from the sleep lab staff directly within the next 2 weeks.  Thank you very much for allowing me to participate in the care of this nice patient. If I can be of any further assistance to you please do not hesitate to call me at [phone].  Sincerely,   Huston Foley, MD, PhD

## 2023-07-18 ENCOUNTER — Ambulatory Visit
Admission: RE | Admit: 2023-07-18 | Discharge: 2023-07-18 | Disposition: A | Discharge status: Home / Self Care | Payer: 59 | Source: Ambulatory Visit | Attending: Nurse Practitioner | Admitting: Nurse Practitioner

## 2023-07-18 DIAGNOSIS — Z1231 Encounter for screening mammogram for malignant neoplasm of breast: Secondary | ICD-10-CM

## 2023-08-02 ENCOUNTER — Ambulatory Visit: Payer: 59 | Admitting: Neurology

## 2023-08-02 DIAGNOSIS — R0683 Snoring: Secondary | ICD-10-CM

## 2023-08-02 DIAGNOSIS — R519 Headache, unspecified: Secondary | ICD-10-CM

## 2023-08-02 DIAGNOSIS — Z9189 Other specified personal risk factors, not elsewhere classified: Secondary | ICD-10-CM

## 2023-08-02 DIAGNOSIS — G4733 Obstructive sleep apnea (adult) (pediatric): Secondary | ICD-10-CM | POA: Diagnosis not present

## 2023-08-02 DIAGNOSIS — Z820 Family history of epilepsy and other diseases of the nervous system: Secondary | ICD-10-CM

## 2023-08-02 DIAGNOSIS — R5383 Other fatigue: Secondary | ICD-10-CM

## 2023-08-07 ENCOUNTER — Ambulatory Visit: Payer: 59

## 2023-08-07 ENCOUNTER — Encounter: Payer: Self-pay | Admitting: Gastroenterology

## 2023-08-07 VITALS — Ht 63.0 in | Wt 256.0 lb

## 2023-08-07 DIAGNOSIS — Z860100 Personal history of colon polyps, unspecified: Secondary | ICD-10-CM

## 2023-08-07 MED ORDER — NA SULFATE-K SULFATE-MG SULF 17.5-3.13-1.6 GM/177ML PO SOLN: 1.0000 | Freq: Once | ORAL | 0 refills | Status: AC

## 2023-08-07 NOTE — Progress Notes (Signed)
No egg or soy allergy known to patient  No issues known to pt with past sedation with any surgeries or procedures Patient denies ever being told they had issues or difficulty with intubation  No FH of Malignant Hyperthermia Pt is not on diet pills Pt is not on  home 02  Pt is not on blood thinners  Pt denies issues with constipation  No A fib or A flutter Have any cardiac testing pending--no Pt instructed to use Singlecare.com or GoodRx for a price reduction on prep  Patient's chart reviewed by Cathlyn Parsons CNRA prior to previsit and patient appropriate for the [facility].  Previsit completed and red dot placed by patient's name on their procedure day (on provider's schedule).    Ambulates independently

## 2023-08-21 ENCOUNTER — Ambulatory Visit: Payer: 59 | Admitting: Gastroenterology

## 2023-08-21 ENCOUNTER — Encounter: Payer: Self-pay | Admitting: Gastroenterology

## 2023-08-21 VITALS — BP 124/74 | HR 79 | Temp 97.3°F | Resp 15 | Ht 63.0 in | Wt 256.0 lb

## 2023-08-21 DIAGNOSIS — D122 Benign neoplasm of ascending colon: Secondary | ICD-10-CM | POA: Diagnosis not present

## 2023-08-21 DIAGNOSIS — Z860101 Personal history of adenomatous and serrated colon polyps: Secondary | ICD-10-CM

## 2023-08-21 DIAGNOSIS — K5730 Diverticulosis of large intestine without perforation or abscess without bleeding: Secondary | ICD-10-CM | POA: Diagnosis not present

## 2023-08-21 DIAGNOSIS — Z1211 Encounter for screening for malignant neoplasm of colon: Secondary | ICD-10-CM | POA: Diagnosis present

## 2023-08-21 DIAGNOSIS — K5790 Diverticulosis of intestine, part unspecified, without perforation or abscess without bleeding: Secondary | ICD-10-CM

## 2023-08-21 DIAGNOSIS — Z860100 Personal history of colon polyps, unspecified: Secondary | ICD-10-CM

## 2023-08-21 DIAGNOSIS — K635 Polyp of colon: Secondary | ICD-10-CM | POA: Diagnosis not present

## 2023-08-21 MED ORDER — SODIUM CHLORIDE 0.9 % IV SOLN
500.0000 mL | Freq: Once | INTRAVENOUS | Status: DC
Start: 2023-08-21 — End: 2023-08-21

## 2023-08-21 NOTE — Progress Notes (Signed)
To pacu, VSS. Report to [email or web address] 

## 2023-08-21 NOTE — Progress Notes (Signed)
Pt's states no medical or surgical changes since previsit or office visit. 

## 2023-08-21 NOTE — Progress Notes (Signed)
History and Physical:  This patient presents for endoscopic testing for: Encounter Diagnosis  Name Primary?   History of colonic polyps Yes    52 yo woman here today for surveillance colonoscopy. > 10mm TA in Nov 2021 Patient denies chronic abdominal pain, rectal bleeding, constipation or diarrhea.   Patient is otherwise without complaints or active issues today.   Past Medical History: 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HCC)    Elevated blood pressure    Elevated blood pressure reading 11/05/2021   Fibroids    GERD (gastroesophageal reflux disease)    HPV test positive 01/2015   normal cytology positive high-risk HPV, negative subtype 16, 18/45 recommend repeat Pap smear in one year   Hypertension    Hypolipidemia    Ovarian cyst    STD (sexually transmitted disease)    gc over 19yrs ago   Substance abuse (HCC)    hx of - 18 years ago   UTI (lower urinary tract infection)      Past Surgical History: Past Surgical History:  Procedure Laterality Date   COLONOSCOPY     FOOT SURGERY Right    bone spur   WISDOM TOOTH EXTRACTION      Allergies: No Known Allergies  Outpatient Meds: Current Outpatient Medications  Medication Sig Dispense Refill   buPROPion (WELLBUTRIN SR) 150 MG 12 hr tablet Take 1 tablet (150 mg total) by mouth 2 (two) times daily. 60 tablet 5   cholecalciferol (VITAMIN D3) 25 MCG (1000 UNIT) tablet Take 1,000 Units by mouth daily.     fluticasone (FLONASE) 50 MCG/ACT nasal spray Place 2 sprays into both nostrils daily. 16 g 2   pantoprazole (PROTONIX) 40 MG tablet Take 1 tablet (40 mg total) by mouth in the morning. 90 tablet 1   famotidine (PEPCID) 40 MG tablet Take 1 tablet (40 mg total) by mouth at bedtime. 90 tablet 0   nicotine (NICODERM CQ) 7 mg/24hr patch Place 1 patch (7 mg total) onto the skin daily. (Patient not taking: Reported on 08/07/2023) 28 patch 0   Current Facility-Administered  Medications  Medication Dose Route Frequency Provider Last Rate Last Admin   0.9 %  sodium chloride infusion  500 mL Intravenous Once Danis, Andreas Blower, MD          ___________________________________________________________________ Objective   Exam:  BP (!) 153/82   Pulse 92   Temp (!) 97.3 F (36.3 C)   Ht 5\' 3"  (1.6 m)   Wt 256 lb (116.1 kg)   LMP 08/08/2023   SpO2 99%   BMI 45.35 kg/m   CV: regular , S1/S2 Resp: clear to auscultation bilaterally, normal RR and effort noted GI: soft, no tenderness, with active bowel sounds.   Assessment: Encounter Diagnosis  Name Primary?   History of colonic polyps Yes     Plan: Colonoscopy   The benefits and risks of the planned procedure were described in detail with the patient or (when appropriate) their health care proxy.  Risks were outlined as including, but not limited to, bleeding, infection, perforation, adverse medication reaction leading to cardiac or pulmonary decompensation, pancreatitis (if ERCP).  The limitation of incomplete mucosal visualization was also discussed.  No guarantees or warranties were given.  The patient is appropriate for an endoscopic procedure in the ambulatory setting.   - Amada Jupiter, MD

## 2023-08-21 NOTE — Patient Instructions (Addendum)
Resume previous diet.                           - Continue present medications.                           - Await pathology results.                           - Repeat colonoscopy is recommended for                            surveillance. The colonoscopy date will be                            determined after pathology results from today's                            exam become available for review  Handout on polyps and diverticulosis given.      YOU HAD AN ENDOSCOPIC PROCEDURE TODAY AT THE [facility]:   Refer to the procedure report that was given to you for any specific questions about what was found during the examination.  If the procedure report does not answer your questions, please call your gastroenterologist to clarify.  If you requested that your care partner not be given the details of your procedure findings, then the procedure report has been included in a sealed envelope for you to review at your convenience later.  YOU SHOULD EXPECT: Some feelings of bloating in the abdomen. Passage of more gas than usual.  Walking can help get rid of the air that was put into your GI tract during the procedure and reduce the bloating. If you had a lower endoscopy (such as a colonoscopy or flexible sigmoidoscopy) you may notice spotting of blood in your stool or on the toilet paper. If you underwent a bowel prep for your procedure, you may not have a normal bowel movement for a few days.  Please Note:  You might notice some irritation and congestion in your nose or some drainage.  This is from the oxygen used during your procedure.  There is no need for concern and it should clear up in a day or so.  SYMPTOMS TO REPORT IMMEDIATELY:  Following lower endoscopy (colonoscopy or flexible sigmoidoscopy):  Excessive amounts of blood in the stool  Significant tenderness or worsening of abdominal pains  Swelling of the abdomen that is new, acute  Fever of 100F or higher   For  urgent or emergent issues, a gastroenterologist can be reached at any hour by calling [phone]. Do not use MyChart messaging for urgent concerns.    DIET:  We do recommend a small meal at first, but then you may proceed to your regular diet.  Drink plenty of fluids but you should avoid alcoholic beverages for 24 hours.  ACTIVITY:  You should plan to take it easy for the rest of today and you should NOT DRIVE or use heavy machinery until tomorrow (because of the sedation medicines used during the test).    FOLLOW UP: Our staff will call the number listed on your records the next business day following your procedure.  We will call around 7:15- 8:00 am to check on you  and address any questions or concerns that you may have regarding the information given to you following your procedure. If we do not reach you, we will leave a message.     If any biopsies were taken you will be contacted by phone or by letter within the next 1-3 weeks.  Please call us at [phone] if you have not heard about the biopsies in 3 weeks.    SIGNATURES/CONFIDENTIALITY: You and/or your care partner have signed paperwork which will be entered into your electronic medical record.  These signatures attest to the fact that that the information above on your After Visit Summary has been reviewed and is understood.  Full responsibility of the confidentiality of this discharge information lies with you and/or your care-partner.

## 2023-08-21 NOTE — Progress Notes (Signed)
Called to room to assist during endoscopic procedure.  Patient ID and intended procedure confirmed with present staff. Received instructions for my participation in the procedure from the performing physician.  

## 2023-08-21 NOTE — Op Note (Signed)
[facility] Patient Name: Nicole Mueller Procedure Date: 08/21/2023 9:37 AM MRN: [id number] Endoscopist: Sherilyn Cooter L. Myrtie Neither , MD, [id number] Age: 52 Referring MD:  Date of Birth: 06/01/1971 Gender: Female Account #: [id number] Procedure:                Colonoscopy Indications:              Surveillance: Personal history of adenomatous                            polyps on last colonoscopy 3 years ago                           > 10 millimeter TA in November 2021 Medicines:                Monitored Anesthesia Care Procedure:                Pre-Anesthesia Assessment:                           - Prior to the procedure, a History and Physical                            was performed, and patient medications and                            allergies were reviewed. The patient's tolerance of                            previous anesthesia was also reviewed. The risks                            and benefits of the procedure and the sedation                            options and risks were discussed with the patient.                            All questions were answered, and informed consent                            was obtained. Prior Anticoagulants: The patient has                            taken no anticoagulant or antiplatelet agents. ASA                            Grade Assessment: II - A patient with mild systemic                            disease. After reviewing the risks and benefits,                            the patient was deemed in satisfactory condition to  undergo the procedure.                           After obtaining informed consent, the colonoscope                            was passed under direct vision. Throughout the                            procedure, the patient's blood pressure, pulse, and                            oxygen saturations were monitored continuously. The                            CF [id number] #[id number] was introduced  through the anus                            and advanced to the the cecum, identified by                            appendiceal orifice and ileocecal valve. The                            colonoscopy was performed without difficulty. The                            patient tolerated the procedure well. The quality                            of the bowel preparation was excellent. The                            ileocecal valve, appendiceal orifice, and rectum                            were photographed. Scope In: 9:43:10 AM Scope Out: 9:54:46 AM Scope Withdrawal Time: 0 hours 8 minutes 44 seconds  Total Procedure Duration: 0 hours 11 minutes 36 seconds  Findings:                 The perianal and digital rectal examinations were                            normal.                           Repeat examination of right colon under NBI                            performed.                           Two sessile polyps were found in the ascending  colon. The polyps were diminutive in size. These                            polyps were removed with a cold snare. Resection                            and retrieval were complete.                           A few diverticula were found in the left colon.                           The exam was otherwise without abnormality on                            direct and retroflexion views. Complications:            No immediate complications. Estimated Blood Loss:     Estimated blood loss was minimal. Impression:               - Two diminutive polyps in the ascending colon,                            removed with a cold snare. Resected and retrieved.                           - Diverticulosis in the left colon.                           - The examination was otherwise normal on direct                            and retroflexion views. Recommendation:           - Patient has a contact number available for                             emergencies. The signs and symptoms of potential                            delayed complications were discussed with the                            patient. Return to normal activities tomorrow.                            Written discharge instructions were provided to the                            patient.                           - Resume previous diet.                           - Continue present medications.                           -  Await pathology results.                           - Repeat colonoscopy is recommended for                            surveillance. The colonoscopy date will be                            determined after pathology results from today's                            exam become available for review. Thaddeus Evitts L. Myrtie Neither, MD 08/21/2023 9:59:40 AM This report has been signed electronically.

## 2023-08-22 ENCOUNTER — Telehealth: Payer: Self-pay | Admitting: *Deleted

## 2023-08-22 NOTE — Telephone Encounter (Signed)
  Follow up Call-     08/21/2023    8:41 AM  Call back number  Post procedure Call Back phone  # [phone]  Permission to leave phone message Yes     Patient questions:  Do you have a fever, pain , or abdominal swelling? No. Pain Score  0 *  Have you tolerated food without any problems? Yes.    Have you been able to return to your normal activities? Yes.    Do you have any questions about your discharge instructions: Diet   No. Medications  No. Follow up visit  No.  Do you have questions or concerns about your Care? No.  Actions: * If pain score is 4 or above: No action needed, pain <4.

## 2023-08-23 LAB — SURGICAL PATHOLOGY

## 2023-08-27 ENCOUNTER — Encounter: Payer: Self-pay | Admitting: Gastroenterology

## 2023-09-23 ENCOUNTER — Other Ambulatory Visit: Payer: Self-pay | Admitting: Nurse Practitioner

## 2023-09-23 DIAGNOSIS — K2100 Gastro-esophageal reflux disease with esophagitis, without bleeding: Secondary | ICD-10-CM

## 2023-09-25 ENCOUNTER — Other Ambulatory Visit: Payer: Self-pay | Admitting: Neurology

## 2023-09-25 DIAGNOSIS — G4733 Obstructive sleep apnea (adult) (pediatric): Secondary | ICD-10-CM

## 2023-09-26 ENCOUNTER — Telehealth: Payer: Self-pay | Admitting: Anesthesiology

## 2023-09-26 NOTE — Telephone Encounter (Signed)
 Called pt to inform of HST results. Pt was given results, advised that Dr Buck recommends an autoPAP for treatment of moderate sleep apnea. Pt was advised on compliance, advised to use the autoPAP machine every night for at least hours but we would like her to use it all night if possible. Pt agreed to use [employer] as her DME company. Pt was advised to give us  a call or send us  message through MyChart once she gets set up so that we can schedule her initial follow up appointment between 31 and 90 days. Pt had no additional questions at the moment but was advised to call back if any questions arise.

## 2023-09-26 NOTE — Telephone Encounter (Signed)
-----   Message from True Mar sent at 09/25/2023  5:38 PM EST ----- Patient referred by PCP, seen by me on 07/17/2023, patient had a HST on 09/19/2023.    Please call and notify the patient that the recent home sleep test showed obstructive sleep apnea in the moderate range. I recommend treatment in the form of autoPAP, which means, that we don't have to bring her in for a sleep study with CPAP, but will let her start using a so called autoPAP machine at home, which is a CPAP-like machine with self-adjusting pressures. We will send the order to a local DME company (of her choice, or as per insurance requirement). The DME representative will fit her with a mask, educate her on how to use the machine, how to put the mask on, etc. I have placed an order in the chart. Please send the order, talk to patient, send report to referring MD. We will need a FU in sleep clinic for 10 weeks post-PAP set up, please arrange that with me or one of our NPs. Also reinforce the need for compliance with treatment. Thanks,   True Mar, MD, PhD [facility])

## 2023-10-12 ENCOUNTER — Other Ambulatory Visit: Payer: Self-pay | Admitting: Nurse Practitioner

## 2023-10-12 DIAGNOSIS — K2100 Gastro-esophageal reflux disease with esophagitis, without bleeding: Secondary | ICD-10-CM

## 2023-10-16 ENCOUNTER — Other Ambulatory Visit: Payer: Self-pay | Admitting: Nurse Practitioner

## 2023-10-16 ENCOUNTER — Ambulatory Visit: Payer: 59 | Admitting: Nurse Practitioner

## 2023-10-16 ENCOUNTER — Encounter: Payer: Self-pay | Admitting: Nurse Practitioner

## 2023-10-16 DIAGNOSIS — E7800 Pure hypercholesterolemia, unspecified: Secondary | ICD-10-CM | POA: Insufficient documentation

## 2023-10-16 DIAGNOSIS — D500 Iron deficiency anemia secondary to blood loss (chronic): Secondary | ICD-10-CM

## 2023-10-16 DIAGNOSIS — K2100 Gastro-esophageal reflux disease with esophagitis, without bleeding: Secondary | ICD-10-CM

## 2023-10-16 DIAGNOSIS — Z716 Tobacco abuse counseling: Secondary | ICD-10-CM

## 2023-10-16 DIAGNOSIS — R030 Elevated blood-pressure reading, without diagnosis of hypertension: Secondary | ICD-10-CM

## 2023-10-16 DIAGNOSIS — F17209 Nicotine dependence, unspecified, with unspecified nicotine-induced disorders: Secondary | ICD-10-CM

## 2023-10-16 DIAGNOSIS — R09A2 Foreign body sensation, throat: Secondary | ICD-10-CM

## 2023-10-16 DIAGNOSIS — Z720 Tobacco use: Secondary | ICD-10-CM

## 2023-10-16 MED ORDER — BUPROPION HCL ER (SR) 150 MG PO TB12: 150.0000 mg | ORAL_TABLET | Freq: Two times a day (BID) | ORAL | 1 refills | Status: DC

## 2023-10-16 MED ORDER — PANTOPRAZOLE SODIUM 40 MG PO TBEC: 40.0000 mg | DELAYED_RELEASE_TABLET | Freq: Every morning | ORAL | 1 refills | Status: DC

## 2023-10-16 MED ORDER — FAMOTIDINE 40 MG PO TABS: 40.0000 mg | ORAL_TABLET | Freq: Every day | ORAL | 1 refills | Status: DC

## 2023-10-16 NOTE — Assessment & Plan Note (Signed)
Nausea and epigastric pain with pepcid and pantoprazole. Med refills sent Advised to avoid eating  within 3hrs of bedtime.

## 2023-10-16 NOTE — Assessment & Plan Note (Signed)
Unable to afford wegovy. Had appointment with nutritionist yesterday Unable to afford weight loss clinic due to work schedule and copays Has difficulty with being consistent with lifestyle modification due to lack of motivation Hx of generalized malaise, uncontrolled GERD, Hyperlipidemia, impaired fasting glucose; which are exacerbated by obesity. Works from home: Work schedule: 8am to [employer], lunch 1hr Sleep: 10pm to 5am, feels rested. Breakfast 9:30am, skips lunch due to late breakfast, and last meal 7:30pm or 8pm Wt Readings from Last 3 Encounters:  10/16/23 254 lb 6.4 oz (115.4 kg)  08/21/23 256 lb (116.1 kg)  08/07/23 256 lb (116.1 kg)    Advised to try short exercise video in AM and during Lunch, avoid skipping meals, consistent meal replacement-protein drink, provided printed information on meal portion for each food groups. F/up in 52month

## 2023-10-16 NOTE — Patient Instructions (Signed)
Schedule fasting lab appt. Need to be fasting 8hrs prior to blood draw. Ok to drink water and take BP meds. Maintain Heart healthy diet and daily exercise. Maintain current medications. Monitor BP at home daily in AM Bring BP machine to next appointment.  DASH Eating Plan DASH stands for Dietary Approaches to Stop Hypertension. The DASH eating plan is a healthy eating plan that has been shown to: Lower high blood pressure (hypertension). Reduce your risk for type 2 diabetes, heart disease, and stroke. Help with weight loss. What are tips for following this plan? Reading food labels Check food labels for the amount of salt (sodium) per serving. Choose foods with less than 5 percent of the Daily Value (DV) of sodium. In general, foods with less than 300 milligrams (mg) of sodium per serving fit into this eating plan. To find whole grains, look for the word "whole" as the first word in the ingredient list. Shopping Buy products labeled as "low-sodium" or "no salt added." Buy fresh foods. Avoid canned foods and pre-made or frozen meals. Cooking Try not to add salt when you cook. Use salt-free seasonings or herbs instead of table salt or sea salt. Check with your health care provider or pharmacist before using salt substitutes. Do not fry foods. Cook foods in healthy ways, such as baking, boiling, grilling, roasting, or broiling. Cook using oils that are good for your heart. These include olive, canola, avocado, soybean, and sunflower oil. Meal planning  Eat a balanced diet. This should include: 4 or more servings of fruits and 4 or more servings of vegetables each day. Try to fill half of your plate with fruits and vegetables. 6-8 servings of whole grains each day. 6 or less servings of lean meat, poultry, or fish each day. 1 oz is 1 serving. A 3 oz (85 g) serving of meat is about the same size as the palm of your hand. One egg is 1 oz (28 g). 2-3 servings of low-fat dairy each day. One  serving is 1 cup (237 mL). 1 serving of nuts, seeds, or beans 5 times each week. 2-3 servings of heart-healthy fats. Healthy fats called omega-3 fatty acids are found in foods such as walnuts, flaxseeds, fortified milks, and eggs. These fats are also found in cold-water fish, such as sardines, salmon, and mackerel. Limit how much you eat of: Canned or prepackaged foods. Food that is high in trans fat, such as fried foods. Food that is high in saturated fat, such as fatty meat. Desserts and other sweets, sugary drinks, and other foods with added sugar. Full-fat dairy products. Do not salt foods before eating. Do not eat more than 4 egg yolks a week. Try to eat at least 2 vegetarian meals a week. Eat more home-cooked food and less restaurant, buffet, and fast food. Lifestyle When eating at a restaurant, ask if your food can be made with less salt or no salt. If you drink alcohol: Limit how much you have to: 0-1 drink a day if you are female. 0-2 drinks a day if you are female. Know how much alcohol is in your drink. In the [place] one drink is one 12 oz bottle of beer (355 mL), one 5 oz glass of wine (148 mL), or one 1 oz glass of hard liquor (44 mL). General information Avoid eating more than 2,300 mg of salt a day. If you have hypertension, you may need to reduce your sodium intake to 1,500 mg a day. Work with your provider  to stay at a healthy body weight or lose weight. Ask what the best weight range is for you. On most days of the week, get at least 30 minutes of exercise that causes your heart to beat faster. This may include walking, swimming, or biking. Work with your provider or dietitian to adjust your eating plan to meet your specific calorie needs. What foods should I eat? Fruits All fresh, dried, or frozen fruit. Canned fruits that are in their natural juice and do not have sugar added to them. Vegetables Fresh or frozen vegetables that are raw, steamed, roasted, or grilled.  Low-sodium or reduced-sodium tomato and vegetable juice. Low-sodium or reduced-sodium tomato sauce and tomato paste. Low-sodium or reduced-sodium canned vegetables. Grains Whole-grain or whole-wheat bread. Whole-grain or whole-wheat pasta. Brown rice. Orpah Cobb. Bulgur. Whole-grain and low-sodium cereals. Pita bread. Low-fat, low-sodium crackers. Whole-wheat flour tortillas. Meats and other proteins Skinless chicken or [place]. Ground chicken or [place]. Pork with fat trimmed off. Fish and seafood. Egg whites. Dried beans, peas, or lentils. Unsalted nuts, nut butters, and seeds. Unsalted canned beans. Lean cuts of beef with fat trimmed off. Low-sodium, lean precooked or cured meat, such as sausages or meat loaves. Dairy Low-fat (1%) or fat-free (skim) milk. Reduced-fat, low-fat, or fat-free cheeses. Nonfat, low-sodium ricotta or cottage cheese. Low-fat or nonfat yogurt. Low-fat, low-sodium cheese. Fats and oils Soft margarine without trans fats. Vegetable oil. Reduced-fat, low-fat, or light mayonnaise and salad dressings (reduced-sodium). Canola, safflower, olive, avocado, soybean, and sunflower oils. Avocado. Seasonings and condiments Herbs. Spices. Seasoning mixes without salt. Other foods Unsalted popcorn and pretzels. Fat-free sweets. The items listed above may not be all the foods and drinks you can have. Talk to a dietitian to learn more. What foods should I avoid? Fruits Canned fruit in a light or heavy syrup. Fried fruit. Fruit in cream or butter sauce. Vegetables Creamed or fried vegetables. Vegetables in a cheese sauce. Regular canned vegetables that are not marked as low-sodium or reduced-sodium. Regular canned tomato sauce and paste that are not marked as low-sodium or reduced-sodium. Regular tomato and vegetable juices that are not marked as low-sodium or reduced-sodium. Rosita Fire. Olives. Grains Baked goods made with fat, such as croissants, muffins, or some breads. Dry pasta or  rice meal packs. Meats and other proteins Fatty cuts of meat. Ribs. Fried meat. Tomasa Blase. Bologna, salami, and other precooked or cured meats, such as sausages or meat loaves, that are not lean and low in sodium. Fat from the back of a pig (fatback). Bratwurst. Salted nuts and seeds. Canned beans with added salt. Canned or smoked fish. Whole eggs or egg yolks. Chicken or [place] with skin. Dairy Whole or 2% milk, cream, and half-and-half. Whole or full-fat cream cheese. Whole-fat or sweetened yogurt. Full-fat cheese. Nondairy creamers. Whipped toppings. Processed cheese and cheese spreads. Fats and oils Butter. Stick margarine. Lard. Shortening. Ghee. Bacon fat. Tropical oils, such as coconut, palm kernel, or palm oil. Seasonings and condiments Onion salt, garlic salt, seasoned salt, table salt, and sea salt. Worcestershire sauce. Tartar sauce. Barbecue sauce. Teriyaki sauce. Soy sauce, including reduced-sodium soy sauce. Steak sauce. Canned and packaged gravies. Fish sauce. Oyster sauce. Cocktail sauce. Store-bought horseradish. Ketchup. Mustard. Meat flavorings and tenderizers. Bouillon cubes. Hot sauces. Pre-made or packaged marinades. Pre-made or packaged taco seasonings. Relishes. Regular salad dressings. Other foods Salted popcorn and pretzels. The items listed above may not be all the foods and drinks you should avoid. Talk to a dietitian to learn more. Where to  find more information National Heart, Lung, and Blood Institute (NHLBI): BuffaloDryCleaner.gl American Heart Association (AHA): heart.org Academy of Nutrition and Dietetics: eatright.org National Kidney Foundation (NKF): kidney.org This information is not intended to replace advice given to you by your health care provider. Make sure you discuss any questions you have with your health care provider. Document Revised: 09/21/2022 Document Reviewed: 09/21/2022 Elsevier Patient Education  2024 [employer].

## 2023-10-16 NOTE — Progress Notes (Signed)
Established Patient Visit  Patient: Nicole Mueller   DOB: [birth year]   53 y.o. Female  MRN: [id number] Visit Date: 10/16/2023  Subjective:    Chief Complaint  Patient presents with   Medication Refill    RX refill needed; shingles and Prevnar 20 vaccine are due    Medication Refill   Morbid obesity Unable to afford wegovy. Had appointment with nutritionist yesterday Unable to afford weight loss clinic due to work schedule and copays Has difficulty with being consistent with lifestyle modification due to lack of motivation Hx of generalized malaise, uncontrolled GERD, Hyperlipidemia, impaired fasting glucose; which are exacerbated by obesity. Works from home: Work schedule: 8am to [employer], lunch 1hr Sleep: 10pm to 5am, feels rested. Breakfast 9:30am, skips lunch due to late breakfast, and last meal 7:30pm or 8pm Wt Readings from Last 3 Encounters:  10/16/23 254 lb 6.4 oz (115.4 kg)  08/21/23 256 lb (116.1 kg)  08/07/23 256 lb (116.1 kg)    Advised to try short exercise video in AM and during Lunch, avoid skipping meals, consistent meal replacement-protein drink, provided printed information on meal portion for each food groups. F/up in 32month  Globus sensation Nausea and epigastric pain with pepcid and pantoprazole. Med refills sent Advised to avoid eating  within 3hrs of bedtime.  Tobacco abuse Smokes 3cig every other day if in company of other smokers. Current use of wellbutrin 150mg  BID Advised to consider use of nicorette gum prn for cravings  Elevated blood pressure reading Advised about need for DASH diet and tobacco use cessation BP Readings from Last 3 Encounters:  10/16/23 (!) 140/76  08/21/23 124/74  07/17/23 (!) 150/90    F/up in 32month   Reviewed medical, surgical, and social history today  Medications: Outpatient Medications Prior to Visit  Medication Sig Note   cholecalciferol (VITAMIN D3) 25 MCG (1000 UNIT) tablet Take 1,000 Units by  mouth daily.    fluticasone (FLONASE) 50 MCG/ACT nasal spray Place 2 sprays into both nostrils daily. 10/16/2023: Refills needed   [DISCONTINUED] buPROPion (WELLBUTRIN SR) 150 MG 12 hr tablet Take 1 tablet (150 mg total) by mouth 2 (two) times daily. 10/16/2023: Refills needed   [DISCONTINUED] famotidine (PEPCID) 40 MG tablet Take 1 tablet (40 mg total) by mouth at bedtime. 10/16/2023: Refills needed   [DISCONTINUED] nicotine (NICODERM CQ) 7 mg/24hr patch Place 1 patch (7 mg total) onto the skin daily.    [DISCONTINUED] pantoprazole (PROTONIX) 40 MG tablet Take 1 tablet (40 mg total) by mouth in the morning. 10/16/2023: Refills needed   No facility-administered medications prior to visit.   Reviewed past medical and social history.   ROS per HPI above      Objective:  BP (!) 140/76 (Cuff Size: Normal)   Pulse 91   Temp 98 F (36.7 C) (Temporal)   Resp 18   Wt 254 lb 6.4 oz (115.4 kg)   LMP 10/01/2023 (Exact Date)   SpO2 98%   BMI 45.06 kg/m      Physical Exam Cardiovascular:     Rate and Rhythm: Normal rate and regular rhythm.     Pulses: Normal pulses.     Heart sounds: Normal heart sounds.  Pulmonary:     Effort: Pulmonary effort is normal.  Musculoskeletal:     Right lower leg: No edema.     Left lower leg: No edema.  Neurological:     Mental Status: She  is alert and oriented to person, place, and time.     No results found for any visits on 10/16/23.    Assessment & Plan:    Problem List Items Addressed This Visit     Elevated blood pressure reading   Advised about need for DASH diet and tobacco use cessation BP Readings from Last 3 Encounters:  10/16/23 (!) 140/76  08/21/23 124/74  07/17/23 (!) 150/90    F/up in 24month      Elevated LDL cholesterol level   Relevant Orders   Lipid panel   Globus sensation   Nausea and epigastric pain with pepcid and pantoprazole. Med refills sent Advised to avoid eating  within 3hrs of bedtime.      Iron deficiency  anemia due to chronic blood loss   Relevant Orders   CBC with Differential/Platelet   Iron, TIBC and Ferritin Panel   Morbid obesity (HCC) - Primary   Unable to afford wegovy. Had appointment with nutritionist yesterday Unable to afford weight loss clinic due to work schedule and copays Has difficulty with being consistent with lifestyle modification due to lack of motivation Hx of generalized malaise, uncontrolled GERD, Hyperlipidemia, impaired fasting glucose; which are exacerbated by obesity. Works from home: Work schedule: 8am to [employer], lunch 1hr Sleep: 10pm to 5am, feels rested. Breakfast 9:30am, skips lunch due to late breakfast, and last meal 7:30pm or 8pm Wt Readings from Last 3 Encounters:  10/16/23 254 lb 6.4 oz (115.4 kg)  08/21/23 256 lb (116.1 kg)  08/07/23 256 lb (116.1 kg)    Advised to try short exercise video in AM and during Lunch, avoid skipping meals, consistent meal replacement-protein drink, provided printed information on meal portion for each food groups. F/up in 24month      Relevant Orders   Hemoglobin A1c   Comprehensive metabolic panel   Tobacco abuse   Smokes 3cig every other day if in company of other smokers. Current use of wellbutrin 150mg  BID Advised to consider use of nicorette gum prn for cravings      Relevant Medications   buPROPion (WELLBUTRIN SR) 150 MG 12 hr tablet   Other Visit Diagnoses       Gastroesophageal reflux disease with esophagitis without hemorrhage       Relevant Medications   pantoprazole (PROTONIX) 40 MG tablet   famotidine (PEPCID) 40 MG tablet      Return in about 4 weeks (around 11/13/2023) for HTN, Weight management.     Alysia Penna, NP

## 2023-10-16 NOTE — Assessment & Plan Note (Addendum)
Smokes 3cig every other day if in company of other smokers. Current use of wellbutrin 150mg  BID Advised to consider use of nicorette gum prn for cravings

## 2023-10-16 NOTE — Assessment & Plan Note (Signed)
Advised about need for DASH diet and tobacco use cessation BP Readings from Last 3 Encounters:  10/16/23 (!) 140/76  08/21/23 124/74  07/17/23 (!) 150/90    F/up in 41month

## 2023-10-18 ENCOUNTER — Other Ambulatory Visit (INDEPENDENT_AMBULATORY_CARE_PROVIDER_SITE_OTHER): Payer: 59

## 2023-10-18 DIAGNOSIS — E7800 Pure hypercholesterolemia, unspecified: Secondary | ICD-10-CM

## 2023-10-18 DIAGNOSIS — D500 Iron deficiency anemia secondary to blood loss (chronic): Secondary | ICD-10-CM

## 2023-10-18 LAB — CBC WITH DIFFERENTIAL/PLATELET
Basophils Absolute: 0 10*3/uL (ref 0.0–0.1)
Basophils Relative: 0.5 % (ref 0.0–3.0)
Eosinophils Absolute: 0.2 10*3/uL (ref 0.0–0.7)
Eosinophils Relative: 2.3 % (ref 0.0–5.0)
HCT: 42.5 % (ref 36.0–46.0)
Hemoglobin: 13.8 g/dL (ref 12.0–15.0)
Lymphocytes Relative: 37.2 % (ref 12.0–46.0)
Lymphs Abs: 3.2 10*3/uL (ref 0.7–4.0)
MCHC: 32.4 g/dL (ref 30.0–36.0)
MCV: 83.1 fL (ref 78.0–100.0)
Monocytes Absolute: 0.4 10*3/uL (ref 0.1–1.0)
Monocytes Relative: 4.6 % (ref 3.0–12.0)
Neutro Abs: 4.7 10*3/uL (ref 1.4–7.7)
Neutrophils Relative %: 55.4 % (ref 43.0–77.0)
Platelets: 242 10*3/uL (ref 150.0–400.0)
RBC: 5.12 Mil/uL — ABNORMAL HIGH (ref 3.87–5.11)
RDW: 13.7 % (ref 11.5–15.5)
WBC: 8.5 10*3/uL (ref 4.0–10.5)

## 2023-10-18 LAB — LIPID PANEL
Cholesterol: 168 mg/dL (ref 0–200)
HDL: 41.3 mg/dL (ref >39.00)
LDL Cholesterol: 110 mg/dL — ABNORMAL HIGH (ref 0–99)
NonHDL: 126.24
Total CHOL/HDL Ratio: 4
Triglycerides: 82 mg/dL (ref 0.0–149.0)
VLDL: 16.4 mg/dL (ref 0.0–40.0)

## 2023-10-18 LAB — COMPREHENSIVE METABOLIC PANEL
ALT: 14 U/L (ref 0–35)
AST: 17 U/L (ref 0–37)
Albumin: 3.8 g/dL (ref 3.5–5.2)
Alkaline Phosphatase: 109 U/L (ref 39–117)
BUN: 12 mg/dL (ref 6–23)
CO2: 28 meq/L (ref 19–32)
Calcium: 9.5 mg/dL (ref 8.4–10.5)
Chloride: 102 meq/L (ref 96–112)
Creatinine, Ser: 1.03 mg/dL (ref 0.40–1.20)
GFR: 63.36 mL/min (ref >60.00)
Glucose, Bld: 78 mg/dL (ref 70–99)
Potassium: 4 meq/L (ref 3.5–5.1)
Sodium: 138 meq/L (ref 135–145)
Total Bilirubin: 0.4 mg/dL (ref 0.2–1.2)
Total Protein: 7 g/dL (ref 6.0–8.3)

## 2023-10-18 LAB — HEMOGLOBIN A1C: Hgb A1c MFr Bld: 6.1 % (ref 4.6–6.5)

## 2023-10-19 ENCOUNTER — Encounter: Payer: Self-pay | Admitting: Nurse Practitioner

## 2023-10-19 LAB — IRON,TIBC AND FERRITIN PANEL
%SAT: 18 % (ref 16–45)
Ferritin: 30 ng/mL (ref 16–232)
Iron: 76 ug/dL (ref 45–160)
TIBC: 424 ug/dL (ref 250–450)

## 2023-11-16 ENCOUNTER — Ambulatory Visit: Payer: 59 | Admitting: Nurse Practitioner

## 2023-11-28 ENCOUNTER — Ambulatory Visit: Payer: 59 | Admitting: Nurse Practitioner

## 2023-11-28 ENCOUNTER — Encounter: Payer: Self-pay | Admitting: Nurse Practitioner

## 2023-11-28 VITALS — BP 140/100 | HR 94 | Temp 98.0°F | Ht 63.0 in | Wt 253.6 lb

## 2023-11-28 DIAGNOSIS — I10 Essential (primary) hypertension: Secondary | ICD-10-CM

## 2023-11-28 DIAGNOSIS — Z23 Encounter for immunization: Secondary | ICD-10-CM | POA: Diagnosis not present

## 2023-11-28 DIAGNOSIS — Z720 Tobacco use: Secondary | ICD-10-CM

## 2023-11-28 MED ORDER — VALSARTAN-HYDROCHLOROTHIAZIDE 80-12.5 MG PO TABS: 1.0000 | ORAL_TABLET | Freq: Every day | ORAL | 1 refills | Status: DC

## 2023-11-28 NOTE — Assessment & Plan Note (Addendum)
 Continues to smoke 1/2 to 1pack per week. Advised to use nicorette gum 4mg  to manage cravings Consider use of chantix if no improvement F/up in 4month

## 2023-11-28 NOTE — Progress Notes (Signed)
 Established Patient Visit  Patient: Nicole Mueller   DOB: [birth year]   53 y.o. Female  MRN: [id number] Visit Date: 11/28/2023  Subjective:    Chief Complaint  Patient presents with   Hypertension   HTN (hypertension), benign Home BP: 150/98, 142/97, 136/96, 138/98, 138/98, 140/97, 140/100, 138/89, 139/88, 140/89, 140/97 Struggles with cigarette cessation and low salt diet BP Readings from Last 3 Encounters:  11/28/23 (!) 140/100  10/16/23 (!) 140/76  08/21/23 124/74    Checked Home BP machine today-accurate Advised to maintain DASH diet, use nicorette gum Start valsartan 80/12.5mg  F/up in 98months, repeat BMP  Tobacco abuse Continues to smoke 1/2 to 1pack per week. Advised to use nicorette gum 4mg  to manage cravings Consider use of chantix if no improvement F/up in 98month  BP Readings from Last 3 Encounters:  11/28/23 (!) 140/100  10/16/23 (!) 140/76  08/21/23 124/74    Wt Readings from Last 3 Encounters:  11/28/23 253 lb 9.6 oz (115 kg)  10/16/23 254 lb 6.4 oz (115.4 kg)  08/21/23 256 lb (116.1 kg)    Reviewed medical, surgical, and social history today  Medications: Outpatient Medications Prior to Visit  Medication Sig   buPROPion (WELLBUTRIN SR) 150 MG 12 hr tablet Take 1 tablet (150 mg total) by mouth 2 (two) times daily.   cholecalciferol (VITAMIN D3) 25 MCG (1000 UNIT) tablet Take 1,000 Units by mouth daily.   famotidine (PEPCID) 40 MG tablet Take 1 tablet (40 mg total) by mouth at bedtime.   fluticasone (FLONASE) 50 MCG/ACT nasal spray Place 2 sprays into both nostrils daily.   pantoprazole (PROTONIX) 40 MG tablet Take 1 tablet (40 mg total) by mouth in the morning.   No facility-administered medications prior to visit.   Reviewed past medical and social history.   ROS per HPI above      Objective:  BP (!) 140/100 (BP Location: Left Arm, Patient Position: Sitting, Cuff Size: Normal)   Pulse 94   Temp 98 F (36.7 C) (Temporal)   Ht  5\' 3"  (1.6 m)   Wt 253 lb 9.6 oz (115 kg)   LMP 11/25/2023   SpO2 98%   BMI 44.92 kg/m      Physical Exam Vitals and nursing note reviewed.  Cardiovascular:     Rate and Rhythm: Normal rate.     Pulses: Normal pulses.  Pulmonary:     Effort: Pulmonary effort is normal.  Musculoskeletal:     Right lower leg: No edema.     Left lower leg: No edema.  Neurological:     Mental Status: She is alert and oriented to person, place, and time.     No results found for any visits on 11/28/23.    Assessment & Plan:    Problem List Items Addressed This Visit     HTN (hypertension), benign - Primary   Home BP: 150/98, 142/97, 136/96, 138/98, 138/98, 140/97, 140/100, 138/89, 139/88, 140/89, 140/97 Struggles with cigarette cessation and low salt diet BP Readings from Last 3 Encounters:  11/28/23 (!) 140/100  10/16/23 (!) 140/76  08/21/23 124/74    Checked Home BP machine today-accurate Advised to maintain DASH diet, use nicorette gum Start valsartan 80/12.5mg  F/up in 98months, repeat BMP      Relevant Medications   valsartan-hydrochlorothiazide (DIOVAN-HCT) 80-12.5 MG tablet   Tobacco abuse   Continues to smoke 1/2 to 1pack per week. Advised to use nicorette  gum 4mg  to manage cravings Consider use of chantix if no improvement F/up in 61month      Other Visit Diagnoses       Need for shingles vaccine       Relevant Orders   Zoster, Recombinant (Shingrix) (Completed)      Return in about 4 weeks (around 12/26/2023) for HTN.     Alysia Penna, NP

## 2023-11-28 NOTE — Assessment & Plan Note (Signed)
 Home BP: 150/98, 142/97, 136/96, 138/98, 138/98, 140/97, 140/100, 138/89, 139/88, 140/89, 140/97 Struggles with cigarette cessation and low salt diet BP Readings from Last 3 Encounters:  11/28/23 (!) 140/100  10/16/23 (!) 140/76  08/21/23 124/74    Checked Home BP machine today-accurate Advised to maintain DASH diet, use nicorette gum Start valsartan 80/12.5mg  F/up in 1months, repeat BMP

## 2023-12-24 ENCOUNTER — Encounter: Payer: Self-pay | Admitting: Nurse Practitioner

## 2023-12-24 ENCOUNTER — Ambulatory Visit: Admitting: Nurse Practitioner

## 2023-12-24 VITALS — BP 129/86 | HR 94 | Temp 97.6°F | Ht 63.0 in | Wt 252.6 lb

## 2023-12-24 DIAGNOSIS — Z23 Encounter for immunization: Secondary | ICD-10-CM | POA: Diagnosis not present

## 2023-12-24 DIAGNOSIS — I10 Essential (primary) hypertension: Secondary | ICD-10-CM

## 2023-12-24 LAB — BASIC METABOLIC PANEL WITH GFR
BUN: 11 mg/dL (ref 6–23)
CO2: 28 meq/L (ref 19–32)
Calcium: 9.1 mg/dL (ref 8.4–10.5)
Chloride: 102 meq/L (ref 96–112)
Creatinine, Ser: 1.01 mg/dL (ref 0.40–1.20)
GFR: 64.78 mL/min (ref >60.00)
Glucose, Bld: 81 mg/dL (ref 70–99)
Potassium: 3.8 meq/L (ref 3.5–5.1)
Sodium: 137 meq/L (ref 135–145)

## 2023-12-24 MED ORDER — VALSARTAN-HYDROCHLOROTHIAZIDE 160-12.5 MG PO TABS: 1.0000 | ORAL_TABLET | Freq: Every day | ORAL | 1 refills | Status: DC

## 2023-12-24 NOTE — Patient Instructions (Addendum)
 Go to lab Maintain med dose Monitor Bp daily in AM Bring BP reading to next appointment Increase med dose to 160/12.5mg  daily if BP remains >130/80

## 2023-12-24 NOTE — Progress Notes (Unsigned)
 Established Patient Visit  Patient: Nicole Mueller   DOB: 1971/05/31   53 y.o. Female  MRN: [id number] Visit Date: 12/24/2023  Subjective:    Chief Complaint  Patient presents with   Follow-up    4 week f/u for HTN   HPI HTN (hypertension), benign Home BP: 150/98, 142/97, 136/96, 138/98, 138/98, 140/97, 140/100, 138/89, 139/88, 140/89, 140/97 Struggles with cigarette cessation and low salt diet BP Readings from Last 3 Encounters:  12/24/23 129/86  11/28/23 (!) 140/100  10/16/23 (!) 140/76    Checked Home BP machine today-accurate Advised to maintain DASH diet, use nicorette gum Start valsartan 80/12.5mg  F/up in 1months, repeat BMP  BP Readings from Last 3 Encounters:  12/24/23 129/86  11/28/23 (!) 140/100  10/16/23 (!) 140/76    Reviewed medical, surgical, and social history today  Medications: Outpatient Medications Prior to Visit  Medication Sig   buPROPion (WELLBUTRIN SR) 150 MG 12 hr tablet Take 1 tablet (150 mg total) by mouth 2 (two) times daily.   cholecalciferol (VITAMIN D3) 25 MCG (1000 UNIT) tablet Take 1,000 Units by mouth daily.   famotidine (PEPCID) 40 MG tablet Take 1 tablet (40 mg total) by mouth at bedtime.   fluticasone (FLONASE) 50 MCG/ACT nasal spray Place 2 sprays into both nostrils daily.   pantoprazole (PROTONIX) 40 MG tablet Take 1 tablet (40 mg total) by mouth in the morning.   [DISCONTINUED] valsartan-hydrochlorothiazide (DIOVAN-HCT) 80-12.5 MG tablet Take 1 tablet by mouth daily.   No facility-administered medications prior to visit.   Reviewed past medical and social history.   ROS per HPI above  {Show previous labs (optional):23779}    Objective:  BP 129/86 (BP Location: Left Arm, Patient Position: Sitting, Cuff Size: Large)   Pulse 94   Temp 97.6 F (36.4 C) (Temporal)   Ht 5\' 3"  (1.6 m)   Wt 252 lb 9.6 oz (114.6 kg)   LMP 12/16/2023   SpO2 98%   BMI 44.75 kg/m      Physical Exam Vitals and nursing note  reviewed.  Cardiovascular:     Rate and Rhythm: Normal rate and regular rhythm.     Pulses: Normal pulses.     Heart sounds: Normal heart sounds.  Pulmonary:     Effort: Pulmonary effort is normal.     Breath sounds: Normal breath sounds.  Musculoskeletal:     Right lower leg: No edema.     Left lower leg: No edema.  Neurological:     Mental Status: She is alert and oriented to person, place, and time.     Results for orders placed or performed in visit on 12/24/23  Basic metabolic panel with GFR  Result Value Ref Range   Sodium 137 135 - 145 mEq/L   Potassium 3.8 3.5 - 5.1 mEq/L   Chloride 102 96 - 112 mEq/L   CO2 28 19 - 32 mEq/L   Glucose, Bld 81 70 - 99 mg/dL   BUN 11 6 - 23 mg/dL   Creatinine, Ser 4.09 0.40 - 1.20 mg/dL   GFR 81.19 >14.78 mL/min   Calcium 9.1 8.4 - 10.5 mg/dL      Assessment & Plan:    Problem List Items Addressed This Visit     HTN (hypertension), benign - Primary   Home BP: 150/98, 142/97, 136/96, 138/98, 138/98, 140/97, 140/100, 138/89, 139/88, 140/89, 140/97 Struggles with cigarette cessation and low salt diet BP  Readings from Last 3 Encounters:  12/24/23 129/86  11/28/23 (!) 140/100  10/16/23 (!) 140/76    Checked Home BP machine today-accurate Advised to maintain DASH diet, use nicorette gum Start valsartan 80/12.5mg  F/up in 1months, repeat BMP      Relevant Medications   valsartan-hydrochlorothiazide (DIOVAN-HCT) 160-12.5 MG tablet   Other Relevant Orders   Basic metabolic panel with GFR (Completed)   Other Visit Diagnoses       Immunization due       Relevant Orders   Pneumococcal conjugate vaccine 20-valent (Prevnar 20) (Completed)      Return in about 4 weeks (around 01/21/2024) for HTN.     Alysia Penna, NP

## 2023-12-24 NOTE — Assessment & Plan Note (Addendum)
 Bp not at goal. Reports med complaince Continues to Struggles with cigarette cessation and low salt diet BP Readings from Last 3 Encounters:  12/24/23 129/86  11/28/23 (!) 140/100  10/16/23 (!) 140/76    Advised to maintain DASH diet, use nicorette gum to help with cigarette cessation increase valsartan to 160/12.5mg  F/up in 68month,  repeat BMP

## 2023-12-26 ENCOUNTER — Ambulatory Visit: Admitting: Nurse Practitioner

## 2024-01-29 ENCOUNTER — Ambulatory Visit: Admitting: Nurse Practitioner

## 2024-03-05 ENCOUNTER — Ambulatory Visit: Admitting: Dermatology

## 2024-03-05 ENCOUNTER — Encounter: Payer: Self-pay | Admitting: Dermatology

## 2024-03-05 VITALS — BP 130/89 | HR 97

## 2024-03-05 DIAGNOSIS — L669 Cicatricial alopecia, unspecified: Secondary | ICD-10-CM | POA: Diagnosis not present

## 2024-03-05 DIAGNOSIS — L6681 Central centrifugal cicatricial alopecia: Secondary | ICD-10-CM

## 2024-03-05 MED ORDER — SAFETY SEAL MISCELLANEOUS MISC: 1.0000 | Freq: Every morning | 11 refills | Status: AC

## 2024-03-05 NOTE — Progress Notes (Signed)
   New Patient Visit   Subjective  Nicole Mueller is a 53 y.o. female who presents for the following: Scalp itchy and burning  Patient states she has itchy and burning located at the scalp that she would like to have examined. Patient reports the areas have been there for 1 year. She reports the areas are bothersome. Patient rates irritation 6 out of 10. Patient reports she has previously been treated for these areas but unsure of the medication. She reports it was a solution.She also reports the hair in that area is shorter. Patient denies Hx of bx.   The following portions of the chart were reviewed this encounter and updated as appropriate: medications, allergies, medical history  Review of Systems:  No other skin or systemic complaints except as noted in HPI or Assessment and Plan.  Objective  Well appearing patient in no apparent distress; mood and affect are within normal limits.  A focused examination was performed of the following areas: Scalp  Relevant exam findings are noted in the Assessment and Plan.         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 Patient presents with scalp issues and hair breakage at the crown, accompanied by intermittent burning sensation for about a year or more. Symptoms flare up around menstrual cycle. Family history of hair loss on both paternal and maternal sides. Patient experiences periods of burning and itching, which are not constant. Hair grows and then breaks, particularly at the top of the scalp. Condition is central, starting in the center of the scalp or crown, and can spread to the front. CCCA characterized by inflammation leading to scarring and alopecia. Inflammation stunts growth,  causing hair to break and not grow as long. If left untreated, hair follicles would scar, and hair would be permanently lost. CCCA is a genetic condition, common in women of color, not solely attributed to hair care practices.  - Plan:    Prescribe combination of clobetasol and minoxidil from compounding pharmacy [facility]     - Apply every morning, couple drops, massage into scalp    Recommend supplements: Vivoscal and collagen    Advise patient on hair care:     - Avoid relaxers, tight hairstyles, and excessive heat     - Emphasize less is more approach    Follow-up in 4 months to assess early signs of growth    If no results or symptoms persist, consider injections    Educate patient on importance of controlling inflammation to prevent end stages    Advise continuing treatment regardless of location    Recommend finding a dermatologist for ongoing care    Return in about 4 months (around 07/05/2024) for CCCA F/U.  I, Jetta Ager, am acting as Neurosurgeon for [employer], DO.  Documentation: I have reviewed the above documentation for accuracy and completeness, and I agree with the above.  Louana Roup, DO

## 2024-03-05 NOTE — Patient Instructions (Addendum)
 Date: Wed Mar 05 2024  Hello Nicole Mueller,  Thank you for visiting today. Here is a summary of the key instructions:  Diagnosis: Central Centrifugal Cicatricial Alopecia (CCCA)   - Medications: Apply a mixture of clobetasol and minoxidil to the scalp every morning   - Use a 3-4 of drops in the center and front of scalp and massage it in   - Get this mixture from [facility] compounding pharmacy (cost: $45)  - Supplements:   - Take Vivoscal   - Take collagen  - Hair Care:   - Avoid relaxers   - Avoid tight hairstyles   - Avoid using too much heat on hair  - Follow-up:   - Return for a follow-up appointment in 4 months   - Continue treatment even if you move to a new location   - Find a dermatologist to continue care if moving  We look forward to seeing you at your next visit. If you have any questions or concerns before then, please do not hesitate to contact our office.  Warm regards,  Dr. Louana Roup, Dermatology              Important Information   Due to recent changes in healthcare laws, you may see results of your pathology and/or laboratory studies on MyChart before the doctors have had a chance to review them. We understand that in some cases there may be results that are confusing or concerning to you. Please understand that not all results are received at the same time and often the doctors may need to interpret multiple results in order to provide you with the best plan of care or course of treatment. Therefore, we ask that you please give us  2 business days to thoroughly review all your results before contacting the office for clarification. Should we see a critical lab result, you will be contacted sooner.     If You Need Anything After Your Visit   If you have any questions or concerns for your doctor, please call our main line at [phone]. If no one answers, please leave a voicemail as directed and we will return your call as soon as possible. Messages  left after 4 pm will be answered the following business day.    You may also send us  a message via MyChart. We typically respond to MyChart messages within 1-2 business days.  For prescription refills, please ask your pharmacy to contact our office. Our fax number is [phone].  If you have an urgent issue when the clinic is closed that cannot wait until the next business day, you can page your doctor at the number below.     Please note that while we do our best to be available for urgent issues outside of office hours, we are not available 24/7.    If you have an urgent issue and are unable to reach us , you may choose to seek medical care at your doctor's office, retail clinic, urgent care center, or emergency room.   If you have a medical emergency, please immediately call 911 or go to the emergency department. In the event of inclement weather, please call our main line at [phone] for an update on the status of any delays or closures.  Dermatology Medication Tips: Please keep the boxes that topical medications come in in order to help keep track of the instructions about where and how to use these. Pharmacies typically print the medication instructions only on the boxes and not directly  on the medication tubes.   If your medication is too expensive, please contact our office at [phone] or send us  a message through MyChart.    We are unable to tell what your co-pay for medications will be in advance as this is different depending on your insurance coverage. However, we may be able to find a substitute medication at lower cost or fill out paperwork to get insurance to cover a needed medication.    If a prior authorization is required to get your medication covered by your insurance company, please allow us  1-2 business days to complete this process.   Drug prices often vary depending on where the prescription is filled and some pharmacies may offer cheaper prices.   The  website www.goodrx.com contains coupons for medications through different pharmacies. The prices here do not account for what the cost may be with help from insurance (it may be cheaper with your insurance), but the website can give you the price if you did not use any insurance.  - You can print the associated coupon and take it with your prescription to the pharmacy.  - You may also stop by our office during regular business hours and pick up a GoodRx coupon card.  - If you need your prescription sent electronically to a different pharmacy, notify our office through [facility] or by phone at [phone]

## 2024-03-19 ENCOUNTER — Ambulatory Visit: Admitting: Nurse Practitioner

## 2024-03-19 VITALS — BP 130/89 | HR 85 | Temp 97.8°F | Ht 63.0 in | Wt 254.4 lb

## 2024-03-19 DIAGNOSIS — N611 Abscess of the breast and nipple: Secondary | ICD-10-CM

## 2024-03-19 DIAGNOSIS — I10 Essential (primary) hypertension: Secondary | ICD-10-CM

## 2024-03-19 MED ORDER — SULFAMETHOXAZOLE-TRIMETHOPRIM 800-160 MG PO TABS: 1.0000 | ORAL_TABLET | Freq: Two times a day (BID) | ORAL | 0 refills | Status: DC

## 2024-03-19 NOTE — Assessment & Plan Note (Signed)
 No change in BP States she did not increase valsartan /hydrochlorothiazide  dose as previously recommended BP Readings from Last 3 Encounters:  03/19/24 130/89  03/05/24 130/89  12/24/23 129/86    Advised to increase med dose as recommended F/up in 1week

## 2024-03-19 NOTE — Patient Instructions (Addendum)
 Apply warm compress 2-3x/day, each time. Leave packing in Change dressing 1-2x/day as needed. Clean with warm water and antibacterial soap. Start bactrim, take with food

## 2024-03-19 NOTE — Progress Notes (Signed)
 Established Patient Visit  Patient: Nicole Mueller   DOB: 1971-08-07   53 y.o. Female  MRN: [id number] Visit Date: 03/19/2024  Subjective:    Chief Complaint  Patient presents with   Follow-up    4 week for HTN   Cyst under left breast    Rash This is a chronic problem. The current episode started more than 1 month ago. The problem has been gradually worsening since onset. The affected locations include the chest. The rash is characterized by pain and swelling. She was exposed to nothing. Past treatments include nothing.   HTN (hypertension), benign No change in BP States she did not increase valsartan /hydrochlorothiazide  dose as previously recommended BP Readings from Last 3 Encounters:  03/19/24 130/89  03/05/24 130/89  12/24/23 129/86    Advised to increase med dose as recommended F/up in 1week  BP Readings from Last 3 Encounters:  03/19/24 130/89  03/05/24 130/89  12/24/23 129/86    Reviewed medical, surgical, and social history today  Medications: Outpatient Medications Prior to Visit  Medication Sig   buPROPion  (WELLBUTRIN  SR) 150 MG 12 hr tablet Take 1 tablet (150 mg total) by mouth 2 (two) times daily.   cholecalciferol (VITAMIN D3) 25 MCG (1000 UNIT) tablet Take 1,000 Units by mouth daily.   fluticasone  (FLONASE ) 50 MCG/ACT nasal spray Place 2 sprays into both nostrils daily.   pantoprazole  (PROTONIX ) 40 MG tablet Take 1 tablet (40 mg total) by mouth in the morning.   Safety Seal Miscellaneous MISC Apply 1 Application topically in the morning. Medication Name: AA Gel (Minoxidil 10%, Clobetasol 0.05%)   valsartan -hydrochlorothiazide  (DIOVAN -HCT) 80-12.5 MG tablet Take 1 tablet by mouth daily.   [DISCONTINUED] famotidine  (PEPCID ) 40 MG tablet Take 1 tablet (40 mg total) by mouth at bedtime. (Patient not taking: Reported on 03/05/2024)   No facility-administered medications prior to visit.   Reviewed past medical and social history.   ROS per HPI  above      Objective:  BP 130/89 (BP Location: Right Arm, Patient Position: Sitting)   Pulse 85   Temp 97.8 F (36.6 C)   Ht 5' 3 (1.6 m)   Wt 254 lb 6.4 oz (115.4 kg)   SpO2 100%   BMI 45.06 kg/m      Physical Exam Vitals and nursing note reviewed.  Cardiovascular:     Rate and Rhythm: Normal rate.     Pulses: Normal pulses.  Pulmonary:     Effort: Pulmonary effort is normal.  Musculoskeletal:     Right lower leg: No edema.     Left lower leg: No edema.  Skin:    Findings: Rash present. Rash is pustular.      Neurological:     Mental Status: She is alert and oriented to person, place, and time.  Psychiatric:        Mood and Affect: Mood normal.        Behavior: Behavior normal.        Thought Content: Thought content normal.      Procedure note: Incision and Drainage of an Abscess (left breast fold)  Indication : a localized collection of pus that is tender and not spontaneously resolving.  Risks including unsuccessful procedure , possible need for a repeat procedure due to pus accumulation, scar formation, and others as well as benefits were explained to the patient in detail. Written consent was obtained/signed.   The patient  was placed in a decubitus position. The area of an abscess on left breast fold was prepped with povidone-iodine and draped in a sterile fashion. Blister incised, scab removed. purulent material was expressed.The cavity was cleaned and packed with packing strip.  The wound was dressed with antibiotic ointment and a large bandaid.  Tolerated well. Complications: None.  Wound instructions provided. Advised to contact us  if you notice a recollection of pus in the abscess fever and chills increased pain redness red streaks near the abscess increased swelling in the area.   No results found for any visits on 03/19/24.    Assessment & Plan:    Problem List Items Addressed This Visit     HTN (hypertension), benign   No change in BP States she  did not increase valsartan /hydrochlorothiazide  dose as previously recommended BP Readings from Last 3 Encounters:  03/19/24 130/89  03/05/24 130/89  12/24/23 129/86    Advised to increase med dose as recommended F/up in 1week      Other Visit Diagnoses       Abscess of left breast    -  Primary   Relevant Medications   sulfamethoxazole-trimethoprim (BACTRIM DS) 800-160 MG tablet      Return in about 1 week (around 03/26/2024) for abscess left breast.     Roselie Mood, NP

## 2024-03-26 ENCOUNTER — Encounter: Payer: Self-pay | Admitting: Nurse Practitioner

## 2024-03-26 ENCOUNTER — Ambulatory Visit: Admitting: Nurse Practitioner

## 2024-03-26 VITALS — BP 132/78 | HR 84 | Temp 98.5°F | Ht 63.0 in | Wt 253.4 lb

## 2024-03-26 DIAGNOSIS — N611 Abscess of the breast and nipple: Secondary | ICD-10-CM

## 2024-03-26 NOTE — Patient Instructions (Addendum)
 Postpone mammogram to next month Complete bactrim  Apply triple antibiotic ointment with each dressing change Apply warm compress 2-3x/day.

## 2024-03-26 NOTE — Progress Notes (Signed)
   Acute Office Visit  Subjective:    Patient ID: BRANDI TOMLINSON, female    DOB: 05/02/1971, 53 y.o.   MRN: [id number]  Chief Complaint  Patient presents with   Follow-up    1 week follow you for left breast abscess-concerned that packing is not all out    HPI Patient is in today for re eval of left breast fold abscess post I&D 1week ago. Today she denies any fever. Has mild pain at site, and some purulent drainage. Current use bactrim  with no adverse effects.  Outpatient Medications Prior to Visit  Medication Sig   buPROPion  (WELLBUTRIN  SR) 150 MG 12 hr tablet Take 1 tablet (150 mg total) by mouth 2 (two) times daily.   cholecalciferol (VITAMIN D3) 25 MCG (1000 UNIT) tablet Take 1,000 Units by mouth daily.   fluticasone  (FLONASE ) 50 MCG/ACT nasal spray Place 2 sprays into both nostrils daily.   pantoprazole  (PROTONIX ) 40 MG tablet Take 1 tablet (40 mg total) by mouth in the morning.   Safety Seal Miscellaneous MISC Apply 1 Application topically in the morning. Medication Name: AA Gel (Minoxidil 10%, Clobetasol 0.05%)   sulfamethoxazole -trimethoprim  (BACTRIM  DS) 800-160 MG tablet Take 1 tablet by mouth 2 (two) times daily.   valsartan -hydrochlorothiazide  (DIOVAN -HCT) 80-12.5 MG tablet Take 1 tablet by mouth daily.   No facility-administered medications prior to visit.    Reviewed past medical and social history.  Review of Systems Per HPI     Objective:    Physical Exam Vitals reviewed.  Skin:        Comments: Wound care: removed packing strip in one piece, no drainage noted, no erythema or induration. Cleaned skin with alcohol wipes. Applied topical antibiotic ointment and covered with wide bandaid. Provided wound care instructions.  Neurological:     Mental Status: She is alert.    BP 132/78 (BP Location: Left Arm, Patient Position: Sitting, Cuff Size: Large)   Pulse 84   Temp 98.5 F (36.9 C) (Oral)   Ht 5' 3 (1.6 m)   Wt 253 lb 6.4 oz (114.9 kg)   LMP  03/25/2024   SpO2 98%   BMI 44.89 kg/m    No results found for any visits on 03/26/24.     Assessment & Plan:   Problem List Items Addressed This Visit   None Visit Diagnoses       Abscess of left breast    -  Primary     Complete bactrim  Apply triple antibiotic ointment with each dressing change Apply warm compress 2-3x/day. Return to office sooner if persistent pain and drainage in 1week  No orders of the defined types were placed in this encounter.  Return in about 3 months (around 06/26/2024) for prediabetes, HTN, hyperlipidemia (fasting).    Roselie Mood, NP

## 2024-04-15 ENCOUNTER — Ambulatory Visit: Payer: 59 | Admitting: Dermatology

## 2024-04-17 ENCOUNTER — Other Ambulatory Visit: Payer: Self-pay | Admitting: Nurse Practitioner

## 2024-04-17 DIAGNOSIS — K2100 Gastro-esophageal reflux disease with esophagitis, without bleeding: Secondary | ICD-10-CM

## 2024-05-06 ENCOUNTER — Ambulatory Visit (INDEPENDENT_AMBULATORY_CARE_PROVIDER_SITE_OTHER): Admitting: Nurse Practitioner

## 2024-05-06 ENCOUNTER — Encounter: Payer: Self-pay | Admitting: Nurse Practitioner

## 2024-05-06 VITALS — BP 124/82 | HR 64 | Ht 62.5 in | Wt 255.0 lb

## 2024-05-06 DIAGNOSIS — Z01419 Encounter for gynecological examination (general) (routine) without abnormal findings: Secondary | ICD-10-CM

## 2024-05-06 DIAGNOSIS — Z1331 Encounter for screening for depression: Secondary | ICD-10-CM | POA: Diagnosis not present

## 2024-05-06 NOTE — Progress Notes (Signed)
 Nicole Mueller August 12, 1971 [id number]   History:  53 y.o. G3P1021 presents for annual exam. Monthly cycles. 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 Normal mammogram history. Smoker - 1/4 ppd. HLD, GERD, managed by PCP.  Gynecologic History Patient's last menstrual period was 03/24/2024 (approximate). Period Cycle (Days): 28 Period Duration (Days): 5-7 Period Pattern: Regular Menstrual Flow: Moderate Menstrual Control: Maxi pad Menstrual Control Change Freq (Hours): 2 Dysmenorrhea: (!) Mild Dysmenorrhea Symptoms: Cramping, Headache Contraception/Family planning: none Sexually active: Yes  Health Maintenance Last Pap: 04/09/2023. Results were: Normal neg HPV Last mammogram: 07/18/2023. Results were: Normal Last colonoscopy: 08/21/2023. Results were: Polyps, 5-year recall Last Dexa: Not indicated     05/06/2024   10:27 AM  Depression screen PHQ 2/9  Decreased Interest 1  Down, Depressed, Hopeless 0  PHQ - 2 Score 1     Past medical history, past surgical history, family history and social history were all reviewed and documented in the EPIC chart. Married. Works for [employer]. 79 yo daughter in [place] .   ROS:  A ROS was performed and pertinent positives and negatives are included.  Exam:  Vitals:   05/06/24 1027  BP: 124/82  Pulse: 64  SpO2: 98%  Weight: 255 lb (115.7 kg)  Height: 5' 2.5 (1.588 m)      Body mass index is 45.9 kg/m.  General appearance:  Normal Thyroid :  Symmetrical, normal in size, without palpable masses or nodularity. Respiratory  Auscultation:  Clear without wheezing or rhonchi Cardiovascular  Auscultation:  Regular rate, without rubs, murmurs or gallops  Edema/varicosities:  Not grossly evident Abdominal  Soft,nontender, without masses, guarding or rebound.  Liver/spleen:  No organomegaly noted  Hernia:  None appreciated  Skin  Inspection:  Grossly normal Breasts: Examined lying and sitting.   Right: Without  masses, retractions, nipple discharge or axillary adenopathy.   Left: Without masses, retractions, nipple discharge or axillary adenopathy. Pelvic: External genitalia:  no lesions              Urethra:  normal appearing urethra with no masses, tenderness or lesions              Bartholins and Skenes: normal                 Vagina: normal appearing vagina with normal color and discharge, no lesions              Cervix: no lesions Bimanual Exam:  Uterus:  no masses or tenderness              Adnexa: no mass, fullness, tenderness              Rectovaginal: Deferred              Anus:  normal, no lesions  Dereck Keas, CMA present as chaperone.    Assessment/Plan:  53 y.o. [id number] for annual exam.   Well female exam with routine gynecological exam - Education provided on SBEs, importance of preventative screenings, current guidelines, high calcium diet, regular exercise, and multivitamin daily. Labs with PCP.  Screening for cervical cancer - 2016 negative cytology positive HPV, subsequent paps normal. Will repeat at 5-year interval per guidelines.   Screening for breast cancer - Normal mammogram history. Continue annual screenings. Normal breast exam today.  Screening for colon cancer - 08/2023 colonoscopy. Will repeat at GI's recommended interval.   Return in about 1 year (around 05/06/2025) for Annual.    Nicole DELENA Shutter DNP, 10:48 AM 05/06/2024

## 2024-05-21 ENCOUNTER — Telehealth: Payer: Self-pay | Admitting: Nurse Practitioner

## 2024-05-21 NOTE — Telephone Encounter (Signed)
CLINICAL USE BELOW THIS LINE (use X to signify taken)  __X__Form received and placed in providers office for signature. ____Form completed and faxed to LOA Dept. ____Form completed & LVM to notify pt ready for pick up ____Charge sheet & copy of form in front office folder for office supervisor.   

## 2024-05-21 NOTE — Telephone Encounter (Signed)
 Called patient to inform her that I did receive her form and that she will need an appointment for a physical due to her last physical predates the date arranged dates on her form. She asked when was her last physical. I informed her that her last physical was on 06/08/23. She asked if she can schedule her physical now and she was informed that the ext physical opening for Roselie will be on 06/19/24. She asked if there was anything that can be done to get her in sooner? Informed patient that I can schedule her for that day and add her to the waitlist for if anything sooner opens. She asked if I could maybe speak with Roselie to ask fi there was any way she could be squeezed in anywhere. Informed her that I will pass her question along to [facility] and give her a call back. She thanked me for calling and asked when was her next appointment and informed her that it is scheduled for 06/26/24 as a 3 month follow up appointment

## 2024-05-21 NOTE — Telephone Encounter (Signed)
 Patient dropped off document Physician Visit Form , to be filled out by provider. Patient requested to send it back via Call Patient to pick up within 7-days. Document is located in providers tray at front office.Please advise at Mobile [phone] (mobile)

## 2024-05-22 ENCOUNTER — Other Ambulatory Visit: Payer: Self-pay | Admitting: Nurse Practitioner

## 2024-05-22 DIAGNOSIS — I10 Essential (primary) hypertension: Secondary | ICD-10-CM

## 2024-05-22 NOTE — Telephone Encounter (Signed)
 Called patient and informed her that there is not a day that she can be worked in. I explained to her to go ahead and take the 06/19/24 appointment that appointment can be added to the wait-list meaning she will notifications via MyChart or text messages of a sooner date and time that she can agree to take what is offered to her. She thanked me for calling and explaining everything to her. Patient agreeable to be scheduled for 06/19/24 appointment and wait-listed.

## 2024-06-10 ENCOUNTER — Encounter: Payer: Self-pay | Admitting: Nurse Practitioner

## 2024-06-10 ENCOUNTER — Ambulatory Visit (INDEPENDENT_AMBULATORY_CARE_PROVIDER_SITE_OTHER): Admitting: Nurse Practitioner

## 2024-06-10 VITALS — BP 128/78 | HR 92 | Temp 98.6°F | Ht 63.0 in | Wt 254.0 lb

## 2024-06-10 DIAGNOSIS — Z23 Encounter for immunization: Secondary | ICD-10-CM

## 2024-06-10 DIAGNOSIS — E7800 Pure hypercholesterolemia, unspecified: Secondary | ICD-10-CM

## 2024-06-10 DIAGNOSIS — Z720 Tobacco use: Secondary | ICD-10-CM

## 2024-06-10 DIAGNOSIS — I10 Essential (primary) hypertension: Secondary | ICD-10-CM | POA: Diagnosis not present

## 2024-06-10 DIAGNOSIS — Z0000 Encounter for general adult medical examination without abnormal findings: Secondary | ICD-10-CM | POA: Diagnosis not present

## 2024-06-10 DIAGNOSIS — R7303 Prediabetes: Secondary | ICD-10-CM | POA: Diagnosis not present

## 2024-06-10 DIAGNOSIS — Z0001 Encounter for general adult medical examination with abnormal findings: Secondary | ICD-10-CM

## 2024-06-10 LAB — HEMOGLOBIN A1C: Hgb A1c MFr Bld: 6.4 % (ref 4.6–6.5)

## 2024-06-10 LAB — LIPID PANEL
Cholesterol: 184 mg/dL (ref 0–200)
HDL: 41.2 mg/dL (ref >39.00)
LDL Cholesterol: 128 mg/dL — ABNORMAL HIGH (ref 0–99)
NonHDL: 143.21
Total CHOL/HDL Ratio: 4
Triglycerides: 75 mg/dL (ref 0.0–149.0)
VLDL: 15 mg/dL (ref 0.0–40.0)

## 2024-06-10 LAB — COMPREHENSIVE METABOLIC PANEL WITH GFR
ALT: 16 U/L (ref 0–35)
AST: 17 U/L (ref 0–37)
Albumin: 3.8 g/dL (ref 3.5–5.2)
Alkaline Phosphatase: 95 U/L (ref 39–117)
BUN: 12 mg/dL (ref 6–23)
CO2: 29 meq/L (ref 19–32)
Calcium: 9.6 mg/dL (ref 8.4–10.5)
Chloride: 102 meq/L (ref 96–112)
Creatinine, Ser: 0.99 mg/dL (ref 0.40–1.20)
GFR: 66.14 mL/min (ref >60.00)
Glucose, Bld: 95 mg/dL (ref 70–99)
Potassium: 4 meq/L (ref 3.5–5.1)
Sodium: 138 meq/L (ref 135–145)
Total Bilirubin: 0.4 mg/dL (ref 0.2–1.2)
Total Protein: 7.2 g/dL (ref 6.0–8.3)

## 2024-06-10 MED ORDER — VARENICLINE TARTRATE 0.5 MG PO TABS: ORAL_TABLET | ORAL | 0 refills | Status: DC

## 2024-06-10 NOTE — Assessment & Plan Note (Signed)
 Cut down to 1/2pack per week with wellbutrin . We discussed use of chantix  in place of wellbutrin . We discussed possible adverse effects e.g vivid dreams, anxiety, insomnia). Also advised on how to start med. She verbalized understanding and agreed to start med  D/c wellbutrin   Sent chantix  F/up in 3months

## 2024-06-10 NOTE — Progress Notes (Unsigned)
 c  Complete physical exam  Patient: Nicole Mueller   DOB: Feb 08, 1971   53 y.o. Female  MRN: [id number] Visit Date: 06/11/2024  Subjective:    Chief Complaint  Patient presents with   Annual Exam    FASTING    Nicole Mueller is a 53 y.o. female who presents today for a complete physical exam. She reports consuming a general diet. No consistent exercise regimen She generally feels well. She reports sleeping fairly well. She does have additional problems to discuss today.  Vision:Yes Dental:Yes STD Screen:No BP Readings from Last 3 Encounters:  06/10/24 128/78  05/06/24 124/82  03/26/24 132/78   Wt Readings from Last 3 Encounters:  06/10/24 254 lb (115.2 kg)  05/06/24 255 lb (115.7 kg)  03/26/24 253 lb 6.4 oz (114.9 kg)    Most recent fall risk assessment:    06/10/2024    8:10 AM  Fall Risk   Falls in the past year? 0  Injury with Fall? 0  Risk for fall due to : No Fall Risks  Follow up Falls evaluation completed     Depression screen:Yes - No Depression Most recent depression screenings:    06/10/2024    8:10 AM 05/06/2024   10:27 AM  PHQ 2/9 Scores  PHQ - 2 Score 1 1  PHQ- 9 Score 3     HPI  Tobacco abuse Cut down to 1/2pack per week with wellbutrin . We discussed use of chantix  in place of wellbutrin . We discussed possible adverse effects e.g vivid dreams, anxiety, insomnia). Also advised on how to start med. She verbalized understanding and agreed to start med  D/c wellbutrin   Sent chantix  F/up in 3months  HTN (hypertension), benign BP at goal BP Readings from Last 3 Encounters:  06/10/24 128/78  05/06/24 124/82  03/26/24 132/78    Maintain med dose Repeat CMP F/up in 3months  Elevated LDL cholesterol level Repeat lipid panel   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HCC)    Elevated blood pressure    Elevated blood pressure reading 11/05/2021   Fibroids    GERD (gastroesophageal reflux disease)  2018   Irritabe throat. Like something stuck. Burping   HPV test positive 01/2015   normal cytology positive high-risk HPV, negative subtype 16, 18/45 recommend repeat Pap smear in one year   Hypertension    Hypolipidemia    Ovarian cyst    STD (sexually transmitted disease)    gc over 25yrs ago   Substance abuse (HCC)    hx of - 18 years ago   UTI (lower urinary tract infection)    Past Surgical History:  Procedure Laterality Date   COLONOSCOPY     FOOT SURGERY Right    bone spur   WISDOM TOOTH EXTRACTION     Social History   Socioeconomic History   Marital status: Married    Spouse name: Not on file   Number of children: 1   Years of education: Not on file   Highest education level: Master's degree (e.g., MA, MS, MEng, MEd, MSW, MBA)  Occupational History   Not on file  Tobacco Use   Smoking status: Every Day    Current packs/day: 0.25    Average packs/day: 0.3 packs/day for 40.4 years (13.5 ttl pk-yrs)    Types: Cigarettes    Passive exposure: Never   Smokeless tobacco: Never   Tobacco comments:    Smokes 3cig every other day  if in company of other smokers  Vaping Use   Vaping status: Never Used  Substance and Sexual Activity   Alcohol use: Yes    Alcohol/week: 1.0 standard drink of alcohol    Types: 1 Glasses of wine per week    Comment: Really don't drink much at all.   Drug use: No   Sexual activity: Yes    Partners: Male    Birth control/protection: None    Comment: 1st intercourse 53 yo- More than 5 partners  Other Topics Concern   Not on file  Social History Narrative   Not on file   Social Drivers of Health   Financial Resource Strain: Medium Risk (03/19/2024)   Overall Financial Resource Strain (CARDIA)    Difficulty of Paying Living Expenses: Somewhat hard  Food Insecurity: No Food Insecurity (03/19/2024)   Hunger Vital Sign    Worried About Running Out of Food in the Last Year: Never true    Ran Out of Food in the Last Year: Never true   Transportation Needs: No Transportation Needs (03/19/2024)   PRAPARE - Administrator, Civil Service (Medical): No    Lack of Transportation (Non-Medical): No  Physical Activity: Inactive (03/19/2024)   Exercise Vital Sign    Days of Exercise per Week: 0 days    Minutes of Exercise per Session: Not on file  Stress: No Stress Concern Present (03/19/2024)   [employer] of Occupational Health - Occupational Stress Questionnaire    Feeling of Stress: Only a little  Social Connections: Socially Integrated (03/19/2024)   Social Connection and Isolation Panel    Frequency of Communication with Friends and Family: More than three times a week    Frequency of Social Gatherings with Friends and Family: Once a week    Attends Religious Services: More than 4 times per year    Active Member of [employer] or Organizations: Yes    Attends Banker Meetings: More than 4 times per year    Marital Status: Married  Catering manager Violence: Not At Risk (02/07/2023)   Humiliation, Afraid, Rape, and Kick questionnaire    Fear of Current or Ex-Partner: No    Emotionally Abused: No    Physically Abused: No    Sexually Abused: No   Family Status  Relation Name Status   Mother Rock Alive   Father  Deceased   Brother Engineer, maintenance (Not Specified)   Mat Aunt Landon and Reena Deceased   Mat Aunt Reena Sor (Not Specified)   Mat Wylie Landon (Not Specified)   Mat Uncle  (Not Specified)   Bruna Wylie  (Not Specified)   MGM Clara H. (Not Specified)   MGF  (Not Specified)   Cousin  (Not Specified)   Neg Hx  (Not Specified)  No partnership data on file   Family History  Problem Relation Age of Onset   Hypertension Mother    Diabetes Mother    Hyperlipidemia Mother    Heart murmur Mother    Alcoholism Father    Drug abuse Father    Prostate cancer Father    Stroke Brother    Drug abuse Brother    Heart disease Brother    Stroke Maternal Aunt    Colon cancer Maternal Aunt 50    Hyperlipidemia Maternal Aunt    Esophageal cancer Maternal Aunt    Cancer Maternal Aunt    Alcoholism Maternal Aunt    Aneurysm Maternal Aunt    Colon cancer Maternal Aunt  Cancer Maternal Aunt        Cervical cancer   Hypertension Maternal Aunt        x2   Cancer Maternal Aunt    Alcoholism Maternal Uncle        x2   Bone cancer Paternal Aunt    Arthritis Maternal Grandmother    Breast cancer Maternal Grandmother 60   Stroke Maternal Grandmother    Hypertension Maternal Grandmother    Alcoholism Maternal Grandfather    Dementia Maternal Grandfather    Breast cancer Cousin        60s   Stomach cancer Neg Hx    Sleep apnea Neg Hx    Rectal cancer Neg Hx    Not on File  Patient Care Team: Keamber Macfadden, Roselie Rockford, NP as PCP - General (Internal Medicine) Prentiss Annabella LABOR, NP as Nurse Practitioner (Gynecology)   Medications: Outpatient Medications Prior to Visit  Medication Sig   cholecalciferol (VITAMIN D3) 25 MCG (1000 UNIT) tablet Take 1,000 Units by mouth daily.   pantoprazole  (PROTONIX ) 40 MG tablet TAKE 1 TABLET (40 MG TOTAL) BY MOUTH IN THE MORNING   Safety Seal Miscellaneous MISC Apply 1 Application topically in the morning. Medication Name: AA Gel (Minoxidil 10%, Clobetasol 0.05%)   valsartan -hydrochlorothiazide  (DIOVAN -HCT) 80-12.5 MG tablet TAKE 1 TABLET BY MOUTH EVERY DAY   [DISCONTINUED] buPROPion  (WELLBUTRIN  SR) 150 MG 12 hr tablet Take 1 tablet (150 mg total) by mouth 2 (two) times daily. (Patient not taking: Reported on 06/10/2024)   [DISCONTINUED] fluticasone  (FLONASE ) 50 MCG/ACT nasal spray Place 2 sprays into both nostrils daily. (Patient not taking: Reported on 06/10/2024)   [DISCONTINUED] sulfamethoxazole -trimethoprim  (BACTRIM  DS) 800-160 MG tablet Take 1 tablet by mouth 2 (two) times daily. (Patient not taking: Reported on 06/10/2024)   No facility-administered medications prior to visit.    Review of Systems  Constitutional:  Negative for activity change,  appetite change and unexpected weight change.  Respiratory: Negative.    Cardiovascular: Negative.   Gastrointestinal: Negative.   Endocrine: Negative for cold intolerance and heat intolerance.  Genitourinary: Negative.   Musculoskeletal: Negative.   Skin: Negative.   Neurological: Negative.   Hematological: Negative.   Psychiatric/Behavioral:  Negative for behavioral problems, decreased concentration, dysphoric mood, hallucinations, self-injury, sleep disturbance and suicidal ideas. The patient is not nervous/anxious.         Objective:  BP 128/78 (BP Location: Left Arm, Patient Position: Sitting, Cuff Size: Large)   Pulse 92   Temp 98.6 F (37 C) (Oral)   Ht 5' 3 (1.6 m)   Wt 254 lb (115.2 kg)   LMP 05/14/2024   SpO2 97%   BMI 44.99 kg/m     Physical Exam Vitals and nursing note reviewed.  Constitutional:      General: She is not in acute distress. HENT:     Right Ear: Tympanic membrane, ear canal and external ear normal.     Left Ear: Tympanic membrane, ear canal and external ear normal.     Nose: Nose normal.  Eyes:     Extraocular Movements: Extraocular movements intact.     Conjunctiva/sclera: Conjunctivae normal.     Pupils: Pupils are equal, round, and reactive to light.  Neck:     Thyroid : No thyroid  mass, thyromegaly or thyroid  tenderness.  Cardiovascular:     Rate and Rhythm: Normal rate and regular rhythm.     Pulses: Normal pulses.     Heart sounds: Normal heart sounds.  Pulmonary:  Effort: Pulmonary effort is normal.     Breath sounds: Normal breath sounds.  Abdominal:     General: Bowel sounds are normal.     Palpations: Abdomen is soft.  Musculoskeletal:        General: Normal range of motion.     Cervical back: Normal range of motion and neck supple.     Right lower leg: No edema.     Left lower leg: No edema.  Lymphadenopathy:     Cervical: No cervical adenopathy.  Skin:    General: Skin is warm and dry.  Neurological:     Mental  Status: She is alert and oriented to person, place, and time.     Cranial Nerves: No cranial nerve deficit.  Psychiatric:        Mood and Affect: Mood normal.        Behavior: Behavior normal.        Thought Content: Thought content normal.      Results for orders placed or performed in visit on 06/10/24  Hemoglobin A1c  Result Value Ref Range   Hgb A1c MFr Bld 6.4 4.6 - 6.5 %  Lipid panel  Result Value Ref Range   Cholesterol 184 0 - 200 mg/dL   Triglycerides 24.9 0.0 - 149.0 mg/dL   HDL 58.79 >60.99 mg/dL   VLDL 84.9 0.0 - 59.9 mg/dL   LDL Cholesterol 871 (H) 0 - 99 mg/dL   Total CHOL/HDL Ratio 4    NonHDL 143.21   Comprehensive metabolic panel with GFR  Result Value Ref Range   Sodium 138 135 - 145 mEq/L   Potassium 4.0 3.5 - 5.1 mEq/L   Chloride 102 96 - 112 mEq/L   CO2 29 19 - 32 mEq/L   Glucose, Bld 95 70 - 99 mg/dL   BUN 12 6 - 23 mg/dL   Creatinine, Ser 9.00 0.40 - 1.20 mg/dL   Total Bilirubin 0.4 0.2 - 1.2 mg/dL   Alkaline Phosphatase 95 39 - 117 U/L   AST 17 0 - 37 U/L   ALT 16 0 - 35 U/L   Total Protein 7.2 6.0 - 8.3 g/dL   Albumin 3.8 3.5 - 5.2 g/dL   GFR 33.85 >39.99 mL/min   Calcium 9.6 8.4 - 10.5 mg/dL      Assessment & Plan:    Routine Health Maintenance and Physical Exam  Immunization History  Administered Date(s) Administered   Influenza, Seasonal, Injecte, Preservative Fre 06/08/2023, 06/10/2024   Influenza,inj,Quad PF,6+ Mos 06/04/2013, 07/01/2014, 07/28/2015, 07/26/2016, 08/20/2017, 09/03/2018, 07/05/2021   PFIZER(Purple Top)SARS-COV-2 Vaccination 12/09/2019, 12/23/2019, 09/13/2020   PNEUMOCOCCAL CONJUGATE-20 12/24/2023   Tdap 07/28/2015   Zoster Recombinant(Shingrix ) 06/08/2023, 11/28/2023   Zoster, Unspecified 06/08/2023    Health Maintenance  Topic Date Due   Mammogram  07/17/2024   Hepatitis B Vaccines 19-59 Average Risk (1 of 3 - 19+ 3-dose series) 06/10/2025 (Originally 03/11/1992)   Hepatitis C Screening  06/10/2025 (Originally  03/12/1991)   DTaP/Tdap/Td (2 - Td or Tdap) 07/27/2025   Cervical Cancer Screening (HPV/Pap Cotest)  04/08/2028   Colonoscopy  08/20/2028   Pneumococcal Vaccine: 50+ Years  Completed   Influenza Vaccine  Completed   HIV Screening  Completed   Zoster Vaccines- Shingrix   Completed   HPV VACCINES  Aged Out   Meningococcal B Vaccine  Aged Out   COVID-19 Vaccine  Discontinued    Discussed health benefits of physical activity, and encouraged her to engage in regular exercise appropriate for her age and  condition.  Problem List Items Addressed This Visit     Elevated LDL cholesterol level   Repeat lipid panel      Relevant Orders   Lipid panel (Completed)   HTN (hypertension), benign   BP at goal BP Readings from Last 3 Encounters:  06/10/24 128/78  05/06/24 124/82  03/26/24 132/78    Maintain med dose Repeat CMP F/up in 3months      Prediabetes   Relevant Orders   Hemoglobin A1c (Completed)   Tobacco abuse   Cut down to 1/2pack per week with wellbutrin . We discussed use of chantix  in place of wellbutrin . We discussed possible adverse effects e.g vivid dreams, anxiety, insomnia). Also advised on how to start med. She verbalized understanding and agreed to start med  D/c wellbutrin   Sent chantix  F/up in 3months      Relevant Medications   varenicline  (CHANTIX ) 0.5 MG tablet   Other Visit Diagnoses       Encounter for preventative adult health care exam with abnormal findings    -  Primary   Relevant Orders   Comprehensive metabolic panel with GFR (Completed)     Immunization due       Relevant Orders   Flu vaccine trivalent PF, 6mos and older(Flulaval,Afluria,Fluarix,Fluzone) (Completed)      Return in about 3 months (around 09/09/2024) for tobacco cessation, HTN, prediabetes, hyperlipidemia (fasting).     Roselie Mood, NP

## 2024-06-10 NOTE — Patient Instructions (Addendum)
 Schedule appointment for annual mammogram Start chantix  1week prior to quit date. Form will completed once we get lab results. Go to lab  How to Cope With Quitting Smoking Learn tips to help you cope with the challenges that come with quitting smoking. To view the content, go to this web address: [email or web address]  This video will expire on: 08/29/2025. If you need access to this video following this date, please reach out to the healthcare provider who assigned it to you. This information is not intended to replace advice given to you by your health care provider. Make sure you discuss any questions you have with your health care provider. Elsevier Patient Education  2024 [employer].

## 2024-06-11 ENCOUNTER — Ambulatory Visit: Payer: Self-pay | Admitting: Nurse Practitioner

## 2024-06-11 NOTE — Assessment & Plan Note (Signed)
 Repeat lipid panel ?

## 2024-06-11 NOTE — Assessment & Plan Note (Signed)
 BP at goal BP Readings from Last 3 Encounters:  06/10/24 128/78  05/06/24 124/82  03/26/24 132/78    Maintain med dose Repeat CMP F/up in 3months

## 2024-06-12 ENCOUNTER — Telehealth: Payer: Self-pay

## 2024-06-12 NOTE — Telephone Encounter (Signed)
 CLINICAL USE BELOW THIS LINE (use X to signify taken)  ____Form received and placed in providers office for signature. _X_Form completed and faxed to. _X___Form completed & notified pt and mail to home address _X__Charge sheet & copy of form in front office folder for office supervisor.

## 2024-06-19 ENCOUNTER — Encounter: Admitting: Nurse Practitioner

## 2024-06-26 ENCOUNTER — Ambulatory Visit: Admitting: Nurse Practitioner

## 2024-06-30 ENCOUNTER — Other Ambulatory Visit: Payer: Self-pay | Admitting: Nurse Practitioner

## 2024-06-30 DIAGNOSIS — Z1231 Encounter for screening mammogram for malignant neoplasm of breast: Secondary | ICD-10-CM

## 2024-07-07 ENCOUNTER — Other Ambulatory Visit: Payer: Self-pay | Admitting: Nurse Practitioner

## 2024-07-07 DIAGNOSIS — Z720 Tobacco use: Secondary | ICD-10-CM

## 2024-07-07 NOTE — Telephone Encounter (Signed)
 Called patient to ask if the Chantix  0.5 mg tablets have been helping with smoking cessation. Patient stated that she still has tablets left over and not sure why the pharmacy is sending in a refill request. I reviewed with patient the directions and she stated that she has been taking 1 tablet BID and that is probably why she has so many. She also stated that the tablets has been helpful that she has now gone 8 days without a cigarette. Informed patient that I will send to [facility] to review. She thanked me for calling.

## 2024-07-15 ENCOUNTER — Ambulatory Visit: Admitting: Dermatology

## 2024-07-18 ENCOUNTER — Ambulatory Visit

## 2024-07-31 ENCOUNTER — Other Ambulatory Visit: Payer: Self-pay | Admitting: Nurse Practitioner

## 2024-07-31 DIAGNOSIS — Z1231 Encounter for screening mammogram for malignant neoplasm of breast: Secondary | ICD-10-CM

## 2024-08-04 ENCOUNTER — Inpatient Hospital Stay: Admission: RE | Admit: 2024-08-04 | Payer: 59 | Source: Ambulatory Visit

## 2024-08-08 ENCOUNTER — Ambulatory Visit
Admission: RE | Admit: 2024-08-08 | Discharge: 2024-08-08 | Disposition: A | Discharge status: Home / Self Care | Source: Ambulatory Visit | Attending: Nurse Practitioner | Admitting: Nurse Practitioner

## 2024-08-08 DIAGNOSIS — Z1231 Encounter for screening mammogram for malignant neoplasm of breast: Secondary | ICD-10-CM

## 2024-08-16 ENCOUNTER — Other Ambulatory Visit: Payer: Self-pay | Admitting: Nurse Practitioner

## 2024-08-16 DIAGNOSIS — Z720 Tobacco use: Secondary | ICD-10-CM

## 2024-10-01 ENCOUNTER — Encounter: Payer: Self-pay | Admitting: Nurse Practitioner

## 2024-10-01 ENCOUNTER — Ambulatory Visit: Admitting: Nurse Practitioner

## 2024-10-01 VITALS — BP 130/78 | HR 88 | Ht 63.0 in | Wt 260.2 lb

## 2024-10-01 DIAGNOSIS — E7800 Pure hypercholesterolemia, unspecified: Secondary | ICD-10-CM | POA: Diagnosis not present

## 2024-10-01 DIAGNOSIS — R7303 Prediabetes: Secondary | ICD-10-CM | POA: Diagnosis not present

## 2024-10-01 DIAGNOSIS — I10 Essential (primary) hypertension: Secondary | ICD-10-CM | POA: Diagnosis not present

## 2024-10-01 DIAGNOSIS — Z87891 Personal history of nicotine dependence: Secondary | ICD-10-CM

## 2024-10-01 LAB — POCT GLYCOSYLATED HEMOGLOBIN (HGB A1C): Hemoglobin A1C: 5.9 % (ref 4.0–5.6)

## 2024-10-01 NOTE — Assessment & Plan Note (Deleted)
 Repeat lipid panel

## 2024-10-01 NOTE — Assessment & Plan Note (Signed)
 BP at goal BP Readings from Last 3 Encounters:  10/01/24 130/78  06/10/24 128/78  05/06/24 124/82    Maintain med dose F/up in 3months

## 2024-10-01 NOTE — Assessment & Plan Note (Signed)
 Agreed to referral to healthy weight management clinic Agreed to start daily exercise-walking 20-52mins or completing a exercise video. Wt Readings from Last 3 Encounters:  10/01/24 260 lb 3.2 oz (118 kg)  06/10/24 254 lb (115.2 kg)  05/06/24 255 lb (115.7 kg)

## 2024-10-01 NOTE — Patient Instructions (Signed)
 hgbA1c at 5.9%. improved from 6.4%. You will be contacted to schedule appointment with weight loss clinic.  Mediterranean Diet A Mediterranean diet is based on the traditions of countries on the [employer]. It focuses on eating more: Fruits and vegetables. Whole grains, beans, nuts, and seeds. Heart-healthy fats. These are fats that are good for your heart. It involves eating less: Dairy. Meat and eggs. Processed foods with added sugar, salt, and fat. This type of diet can help prevent certain conditions. It can also improve outcomes if you have a long-term (chronic) disease, such as kidney or heart disease. What are tips for following this plan? Reading food labels Check packaged foods for: The serving size. For foods such as rice and pasta, the serving size is the amount of cooked product, not dry. The total fat. Avoid foods with saturated fat or trans fat. Added sugars, such as corn syrup. Shopping  Try to have a balanced diet. Buy a variety of foods, such as: Fresh fruits and vegetables. You may be able to get these from local farmers markets. You can also buy them frozen. Grains, beans, nuts, and seeds. Some of these can be bought in bulk. Fresh seafood. Poultry and eggs. Low-fat dairy products. Buy whole ingredients instead of foods that have already been packaged. If you can't get fresh seafood, buy precooked frozen shrimp or canned fish, such as tuna, salmon, or sardines. Stock your pantry so you always have certain foods on hand, such as olive oil, canned tuna, canned tomatoes, rice, pasta, and beans. Cooking Cook foods with extra-virgin olive oil instead of using butter or other vegetable oils. Have meat as a side dish. Have vegetables or grains as your main dish. This means having meat in small portions or adding small amounts of meat to foods like pasta or stew. Use beans or vegetables instead of meat in common dishes like chili or lasagna. Try out different  cooking methods. Try roasting, broiling, steaming, and sauting vegetables. Add frozen vegetables to soups, stews, pasta, or rice. Add nuts or seeds for added healthy fats and plant protein at each meal. You can add these to yogurt, salads, or vegetable dishes. Marinate fish or vegetables using olive oil, lemon juice, garlic, and fresh herbs. Meal planning Plan to eat a vegetarian meal one day each week. Try to work up to two vegetarian meals, if possible. Eat seafood two or more times a week. Have healthy snacks on hand. These may include: Vegetable sticks with hummus. Greek yogurt. Fruit and nut trail mix. Eat balanced meals. These should include: Fruit: 2-3 servings a day. Vegetables: 4-5 servings a day. Low-fat dairy: 2 servings a day. Fish, poultry, or lean meat: 1 serving a day. Beans and legumes: 2 or more servings a week. Nuts and seeds: 1-2 servings a day. Whole grains: 6-8 servings a day. Extra-virgin olive oil: 3-4 servings a day. Limit red meat and sweets to just a few servings a month. Lifestyle  Try to cook and eat meals with your family. Drink enough fluid to keep your pee (urine) pale yellow. Be active every day. This includes: Aerobic exercise, which is exercise that causes your heart to beat faster. Examples include running and swimming. Leisure activities like gardening, walking, or housework. Get 7-8 hours of sleep each night. Drink red wine if your provider says you can. A glass of wine is 5 oz (150 mL). You may be allowed to have: Up to 1 glass a day if you're female and not pregnant.  Up to 2 glasses a day if you're female. What foods should I eat? Fruits Apples. Apricots. Avocado. Berries. Bananas. Cherries. Dates. Figs. Grapes. Lemons. Melon. Oranges. Peaches. Plums. Pomegranate. Vegetables Artichokes. Beets. Broccoli. Cabbage. Carrots. Eggplant. Green beans. Chard. Kale. Spinach. Onions. Leeks. Peas. Squash. Tomatoes. Peppers. Radishes. Grains Whole-grain  pasta. Brown rice. Bulgur wheat. Polenta. Couscous. Whole-wheat bread. [place] Madeira. Meats and other proteins Beans. Almonds. Sunflower seeds. Pine nuts. Peanuts. Cod. Salmon. Scallops. Shrimp. Tuna. Tilapia. Clams. Oysters. Eggs. Chicken or turkey without skin. Dairy Low-fat milk. Cheese. Greek yogurt. Fats and oils Extra-virgin olive oil. Avocado oil. Grapeseed oil. Beverages Water. Red wine. Herbal tea. Sweets and desserts Greek yogurt with honey. Baked apples. Poached pears. Trail mix. Seasonings and condiments Basil. Cilantro. Coriander. Cumin. Mint. Parsley. Sage. Rosemary. Tarragon. Garlic. Oregano. Thyme. Pepper. Balsamic vinegar. Tahini. Hummus. Tomato sauce. Olives. Mushrooms. The items listed above may not be all the foods and drinks you can have. Talk to a dietitian to learn more. What foods should I limit? This is a list of foods that should be eaten rarely. Fruits Fruit canned in syrup. Vegetables Deep-fried potatoes, like French fries. Grains Packaged pasta or rice dishes. Cereal with added sugar. Snacks with added sugar. Meats and other proteins Beef. Pork. Lamb. Chicken or turkey with skin. Hot dogs. [place]. Dairy Ice cream. Sour cream. Whole milk. Fats and oils Butter. Canola oil. Vegetable oil. Beef fat (tallow). Lard. Beverages Juice. Sugar-sweetened soft drinks. Beer. Liquor and spirits. Sweets and desserts Cookies. Cakes. Pies. Candy. Seasonings and condiments Mayonnaise. Pre-made sauces and marinades. The items listed above may not be all the foods and drinks you should limit. Talk to a dietitian to learn more. Where to find more information American Heart Association (AHA): heart.org This information is not intended to replace advice given to you by your health care provider. Make sure you discuss any questions you have with your health care provider. Document Revised: 12/17/2022 Document Reviewed: 12/17/2022 Elsevier Patient Education  2024 [employer].

## 2024-10-01 NOTE — Progress Notes (Signed)
 "               Established Patient Visit  Patient: Nicole Mueller   DOB: [birth year]   54 y.o. Female  MRN: [id number] Visit Date: 10/01/2024  Subjective:    Chief Complaint  Patient presents with   Follow-up    3 month follow up  for tobacco cessation     HPI History of tobacco use Quit 06/27/2024 with use of chantix  x 30month. Reports intermittent craving if around someone who smokes cigarettes. Denies need for patch.  HTN (hypertension), benign BP at goal BP Readings from Last 3 Encounters:  10/01/24 130/78  06/10/24 128/78  05/06/24 124/82    Maintain med dose F/up in 3months  Morbid obesity (HCC) Agreed to referral to healthy weight management clinic Agreed to start daily exercise-walking 20-52mins or completing a exercise video. Wt Readings from Last 3 Encounters:  10/01/24 260 lb 3.2 oz (118 kg)  06/10/24 254 lb (115.2 kg)  05/06/24 255 lb (115.7 kg)      Reviewed medical, surgical, and social history today  Medications: Show/hide medication list[1] Reviewed past medical and social history.   ROS per HPI above      Objective:  BP 130/78 (BP Location: Left Arm, Patient Position: Sitting, Cuff Size: Large)   Pulse 88   Ht 5' 3 (1.6 m)   Wt 260 lb 3.2 oz (118 kg)   LMP 09/28/2024   SpO2 98%   BMI 46.09 kg/m      Physical Exam Vitals and nursing note reviewed.  Constitutional:      Appearance: She is obese.  Cardiovascular:     Rate and Rhythm: Normal rate and regular rhythm.     Pulses: Normal pulses.     Heart sounds: Normal heart sounds.  Pulmonary:     Effort: Pulmonary effort is normal.     Breath sounds: Normal breath sounds.  Neurological:     Mental Status: She is alert and oriented to person, place, and time.  Psychiatric:        Mood and Affect: Mood normal.        Behavior: Behavior normal.        Thought Content: Thought content normal.     Results for orders placed or performed in visit on 10/01/24  POCT  glycosylated hemoglobin (Hb A1C)  Result Value Ref Range   Hemoglobin A1C 5.9 4.0 - 5.6 %   HbA1c POC (<> result, manual entry)     HbA1c, POC (prediabetic range)     HbA1c, POC (controlled diabetic range)        Assessment & Plan:    Problem List Items Addressed This Visit     Elevated LDL cholesterol level   Relevant Orders   Amb Ref to Medical Weight Management   History of tobacco use   Quit 06/27/2024 with use of chantix  x 30month. Reports intermittent craving if around someone who smokes cigarettes. Denies need for patch.      HTN (hypertension), benign - Primary   BP at goal BP Readings from Last 3 Encounters:  10/01/24 130/78  06/10/24 128/78  05/06/24 124/82    Maintain med dose F/up in 3months      Relevant Orders   Amb Ref to Medical Weight Management   Morbid obesity (HCC)   Agreed to referral to healthy weight management clinic Agreed to start daily exercise-walking 20-54mins or completing a exercise video. Wt Readings from Last 3 Encounters:  10/01/24 260  lb 3.2 oz (118 kg)  06/10/24 254 lb (115.2 kg)  05/06/24 255 lb (115.7 kg)         Relevant Orders   Amb Ref to Medical Weight Management   POCT glycosylated hemoglobin (Hb A1C) (Completed)   Prediabetes   Relevant Orders   Amb Ref to Medical Weight Management   POCT glycosylated hemoglobin (Hb A1C) (Completed)   Return in about 3 months (around 12/30/2024) for prediabetes, HTN, hyperlipidemia (fasting).     Roselie Mood, NP      [1]  Outpatient Medications Prior to Visit  Medication Sig   cholecalciferol (VITAMIN D3) 25 MCG (1000 UNIT) tablet Take 1,000 Units by mouth daily.   pantoprazole  (PROTONIX ) 40 MG tablet TAKE 1 TABLET (40 MG TOTAL) BY MOUTH IN THE MORNING   Safety Seal Miscellaneous MISC Apply 1 Application topically in the morning. Medication Name: AA Gel (Minoxidil 10%, Clobetasol 0.05%)   valsartan -hydrochlorothiazide  (DIOVAN -HCT) 80-12.5 MG tablet TAKE 1 TABLET BY  MOUTH EVERY DAY   [DISCONTINUED] varenicline  (CHANTIX ) 0.5 MG tablet Take 2 tablets (1 mg total) by mouth 2 (two) times daily.   No facility-administered medications prior to visit.   "

## 2024-10-01 NOTE — Assessment & Plan Note (Addendum)
 Quit 06/27/2024 with use of chantix  x 30month. Reports intermittent craving if around someone who smokes cigarettes. Denies need for patch.

## 2024-10-13 ENCOUNTER — Encounter: Admitting: Skilled Nursing Facility1

## 2024-10-18 ENCOUNTER — Other Ambulatory Visit: Payer: Self-pay | Admitting: Nurse Practitioner

## 2024-10-18 DIAGNOSIS — K2100 Gastro-esophageal reflux disease with esophagitis, without bleeding: Secondary | ICD-10-CM

## 2024-10-29 ENCOUNTER — Encounter: Admitting: Skilled Nursing Facility1

## 2024-12-30 ENCOUNTER — Ambulatory Visit: Admitting: Nurse Practitioner

## 2025-05-07 ENCOUNTER — Ambulatory Visit: Admitting: Nurse Practitioner
